# Patient Record
Sex: Male | Born: 1955 | Race: White | Hispanic: No | Marital: Single | Smoking: Never smoker
Health system: Southern US, Community
[De-identification: ages and names within clinical notes are randomized; demographics above are authoritative.]

## PROBLEM LIST (undated history)

## (undated) DIAGNOSIS — IMO0002 Reserved for concepts with insufficient information to code with codable children: Secondary | ICD-10-CM

## (undated) DIAGNOSIS — I10 Essential (primary) hypertension: Secondary | ICD-10-CM

## (undated) DIAGNOSIS — M329 Systemic lupus erythematosus, unspecified: Secondary | ICD-10-CM

## (undated) DIAGNOSIS — E559 Vitamin D deficiency, unspecified: Secondary | ICD-10-CM

## (undated) DIAGNOSIS — R7303 Prediabetes: Secondary | ICD-10-CM

## (undated) DIAGNOSIS — K219 Gastro-esophageal reflux disease without esophagitis: Secondary | ICD-10-CM

## (undated) DIAGNOSIS — J302 Other seasonal allergic rhinitis: Secondary | ICD-10-CM

## (undated) HISTORY — PX: TONSILLECTOMY: SUR1361

## (undated) HISTORY — DX: Vitamin D deficiency, unspecified: E55.9

## (undated) HISTORY — PX: WISDOM TOOTH EXTRACTION: SHX21

## (undated) HISTORY — DX: Prediabetes: R73.03

---

## 2002-03-14 ENCOUNTER — Ambulatory Visit (HOSPITAL_COMMUNITY): Admission: RE | Admit: 2002-03-14 | Discharge: 2002-03-14 | Payer: Self-pay | Admitting: Anesthesiology

## 2002-03-14 ENCOUNTER — Encounter: Payer: Self-pay | Admitting: Internal Medicine

## 2011-06-29 ENCOUNTER — Other Ambulatory Visit: Payer: Self-pay

## 2011-06-29 ENCOUNTER — Emergency Department (HOSPITAL_COMMUNITY): Payer: BC Managed Care – PPO

## 2011-06-29 ENCOUNTER — Encounter (HOSPITAL_BASED_OUTPATIENT_CLINIC_OR_DEPARTMENT_OTHER): Payer: Self-pay | Admitting: Emergency Medicine

## 2011-06-29 ENCOUNTER — Emergency Department (HOSPITAL_BASED_OUTPATIENT_CLINIC_OR_DEPARTMENT_OTHER)
Admission: EM | Admit: 2011-06-29 | Discharge: 2011-06-29 | Disposition: A | Discharge status: Home Health | Payer: BC Managed Care – PPO | Attending: Emergency Medicine | Admitting: Emergency Medicine

## 2011-06-29 ENCOUNTER — Emergency Department (INDEPENDENT_AMBULATORY_CARE_PROVIDER_SITE_OTHER): Payer: BC Managed Care – PPO

## 2011-06-29 DIAGNOSIS — E7800 Pure hypercholesterolemia, unspecified: Secondary | ICD-10-CM | POA: Insufficient documentation

## 2011-06-29 DIAGNOSIS — I10 Essential (primary) hypertension: Secondary | ICD-10-CM | POA: Insufficient documentation

## 2011-06-29 DIAGNOSIS — R911 Solitary pulmonary nodule: Secondary | ICD-10-CM

## 2011-06-29 DIAGNOSIS — R1031 Right lower quadrant pain: Secondary | ICD-10-CM

## 2011-06-29 DIAGNOSIS — K389 Disease of appendix, unspecified: Secondary | ICD-10-CM

## 2011-06-29 DIAGNOSIS — K7689 Other specified diseases of liver: Secondary | ICD-10-CM | POA: Insufficient documentation

## 2011-06-29 DIAGNOSIS — Z79899 Other long term (current) drug therapy: Secondary | ICD-10-CM | POA: Insufficient documentation

## 2011-06-29 DIAGNOSIS — K8020 Calculus of gallbladder without cholecystitis without obstruction: Secondary | ICD-10-CM | POA: Insufficient documentation

## 2011-06-29 DIAGNOSIS — R109 Unspecified abdominal pain: Secondary | ICD-10-CM

## 2011-06-29 DIAGNOSIS — F17200 Nicotine dependence, unspecified, uncomplicated: Secondary | ICD-10-CM | POA: Insufficient documentation

## 2011-06-29 DIAGNOSIS — R112 Nausea with vomiting, unspecified: Secondary | ICD-10-CM | POA: Insufficient documentation

## 2011-06-29 DIAGNOSIS — J309 Allergic rhinitis, unspecified: Secondary | ICD-10-CM | POA: Insufficient documentation

## 2011-06-29 DIAGNOSIS — M129 Arthropathy, unspecified: Secondary | ICD-10-CM | POA: Insufficient documentation

## 2011-06-29 HISTORY — DX: Essential (primary) hypertension: I10

## 2011-06-29 HISTORY — DX: Other seasonal allergic rhinitis: J30.2

## 2011-06-29 LAB — COMPREHENSIVE METABOLIC PANEL
ALT: 55 U/L — ABNORMAL HIGH (ref 0–53)
AST: 50 U/L — ABNORMAL HIGH (ref 0–37)
Albumin: 4.4 g/dL (ref 3.5–5.2)
Calcium: 9.6 mg/dL (ref 8.4–10.5)
Creatinine, Ser: 0.8 mg/dL (ref 0.50–1.35)
Sodium: 134 mEq/L — ABNORMAL LOW (ref 135–145)
Total Protein: 8 g/dL (ref 6.0–8.3)

## 2011-06-29 LAB — URINALYSIS, ROUTINE W REFLEX MICROSCOPIC
Bilirubin Urine: NEGATIVE
Glucose, UA: NEGATIVE mg/dL
Hgb urine dipstick: NEGATIVE
Ketones, ur: NEGATIVE mg/dL
Leukocytes, UA: NEGATIVE
Nitrite: NEGATIVE
Protein, ur: NEGATIVE mg/dL
Specific Gravity, Urine: 1.011 (ref 1.005–1.030)
Urobilinogen, UA: 0.2 mg/dL (ref 0.0–1.0)
pH: 6.5 (ref 5.0–8.0)

## 2011-06-29 LAB — DIFFERENTIAL
Basophils Absolute: 0 10*3/uL (ref 0.0–0.1)
Basophils Relative: 0 % (ref 0–1)
Eosinophils Relative: 0 % (ref 0–5)
Monocytes Absolute: 0.5 10*3/uL (ref 0.1–1.0)

## 2011-06-29 LAB — CBC
HCT: 41.6 % (ref 39.0–52.0)
MCHC: 35.6 g/dL (ref 30.0–36.0)
MCV: 86.1 fL (ref 78.0–100.0)
Platelets: 204 10*3/uL (ref 150–400)
RDW: 12 % (ref 11.5–15.5)
WBC: 8.6 10*3/uL (ref 4.0–10.5)

## 2011-06-29 MED ORDER — ONDANSETRON HCL 4 MG/2ML IJ SOLN
4.0000 mg | Freq: Once | INTRAMUSCULAR | Status: AC
  Administered 2011-06-29: 4 mg via INTRAVENOUS
  Filled 2011-06-29: qty 2

## 2011-06-29 MED ORDER — HYDROMORPHONE HCL PF 1 MG/ML IJ SOLN
INTRAMUSCULAR | Status: AC
  Administered 2011-06-29: 1 mg via INTRAVENOUS
  Filled 2011-06-29: qty 1

## 2011-06-29 MED ORDER — SODIUM CHLORIDE 0.9 % IV BOLUS (SEPSIS)
1000.0000 mL | Freq: Once | INTRAVENOUS | Status: AC
  Administered 2011-06-29: 1000 mL via INTRAVENOUS

## 2011-06-29 MED ORDER — HYDROMORPHONE HCL PF 1 MG/ML IJ SOLN
1.0000 mg | Freq: Once | INTRAMUSCULAR | Status: AC
  Administered 2011-06-29: 1 mg via INTRAVENOUS
  Filled 2011-06-29: qty 1

## 2011-06-29 MED ORDER — IOHEXOL 300 MG/ML  SOLN
100.0000 mL | Freq: Once | INTRAMUSCULAR | Status: AC | PRN
  Administered 2011-06-29: 100 mL via INTRAVENOUS

## 2011-06-29 MED ORDER — HYDROMORPHONE HCL PF 1 MG/ML IJ SOLN
1.0000 mg | Freq: Once | INTRAMUSCULAR | Status: AC
  Administered 2011-06-29: 1 mg via INTRAVENOUS

## 2011-06-29 NOTE — Consult Note (Addendum)
He has gallstones by CT scan but by history I am not convinced this is source of his pain.  There is no other source noted though.  He is not tender at all in his abdomen and has absolutely no tenderness in his ruq.  I think reasonable to pursue an ultrasound as the next step and go from there.    COMPLETE ABDOMINAL ULTRASOUND  Comparison: CT abdomen pelvis from earlier the same day.  Findings:  Gallbladder: Shadowing cholelithiasis. Numerous stones both in  the neck and fundus. Individually stones probably measure up to 11  mm in diameter. No gallbladder wall thickening. No sonographic  Murphy's sign elicited. No pericholecystic fluid.  Common bile duct: Normal measuring 4 mm in diameter.  Liver: Diffuse increased echogenicity. No focal liver lesion. No  intrahepatic biliary dilatation.  IVC: Appears normal.  Pancreas: Incompletely visualized due to overlying bowel gas,  visualized portions within normal limits.  Spleen: Normal measuring 8.6 cm in length.  Right Kidney: Normal measuring 11.4 cm in length.  Left Kidney: Normal measuring 12.2 cm in length.  Abdominal aorta: No aneurysm identified.  IMPRESSION:  1. Cholelithiasis. No sonographic evidence of acute  cholecystitis.  2. Hepatic steatosis.   Ultrasound and exam certainly show cholelithiasis. He has no evidence cholecystitis though by exam, u/s or labs.  His transminases are very mildly elevated.  I am not even sure his symptoms are referable to his gallbladder.  He is pain free now and I think reasonable to send him home.  If symptoms recur I gave him my card and asked him to call me.

## 2011-06-29 NOTE — Consult Note (Signed)
[address] [place] D[place]SW[place]TH November 05, 1955  [id number].   Requesting MD: Dr. Duwayne Heck ray Chief Complaint/Reason for Consult: gallstones, abdominal pain HPI: This is a 56 yo white male who began having severe periumbilical abdominal pain last night around 8:00pm.  He developed some nausea and vomiting after drinking a cup of coffee to help him have a BM.  He did have a BM, but this did not help his pain.  His nausea and vomiting dissipated and has not returned.  He had a mild pain about a week ago, but otherwise has never had anything similar to what he is currently experiencing.  He went to [facility] and had a CT scan to rule out appendicitis.  This was negative but did show gallstones.  The case was discussed with our surgical team, and we agreed to have him transferred to [facility] for further evaluation.  The patient currently states his pain is better secondary to some Dilaudid.  He denies any chest pain or fevers.  Review of Systems: Please see HPI, otherwise all other systems reviewed and are negative, except for a sinus headache this morning with some post nasal drip.  History reviewed. No pertinent family history.  Past Medical History  Diagnosis Date  . Hypertension   . Hypercholesteremia   . Arthritis   . Seasonal allergies     History reviewed. No pertinent past surgical history.  Social History:  reports that he has been smoking Cigars.  He has never used smokeless tobacco. He reports that he drinks alcohol. His drug history not on file.  Allergies: No Known Allergies  Medications: ATENOLOL 100 MG PO TABS  Oral  Take 100 mg by mouth daily.  Marland Kitchen  EZETIMIBE 10 MG PO TABS  Oral  Take 10 mg by mouth daily.  .  FENOFIBRATE 40 MG PO TABS  Oral  Take by mouth.  Marland Kitchen  VITAMIN D (ERGOCALCIFEROL) 50000 UNITS PO CAPS  Oral  Take 50,000 Units by mouth.   Blood pressure 152/94, pulse 84, temperature 97.4 F (36.3 C), temperature source Oral, resp. rate 20, height 6' (1.829 m), weight 260 lb  (117.935 kg), SpO2 97.00%. Physical Exam: General: Pleasant rather obese white male who is currently laying in bed in NAD HEENT: Head is normocephalic, atraumatic.  Sclera are noninjected. PERRL.  Glasses are present.  Mouth is pink. Heart: regular, rate, and rhythm.  No murmurs, gallops, or rubs.  Palpable radial and pedal pulses bilaterally Lungs: CTAB, no wheezes, rhonchi, or rales.  Respiratory effort is nonlabored Abd: soft, nontender, ND, +BS, no masses, hernias, or organomegaly. MS: all 4 extremities are symmetrical with no cyanosis, clubbing, or edema Skin: warm and dry with no obvious rashes, lesions, or rashes Psych: A&O x3 with an appropriate affect   Results for orders placed during the hospital encounter of 06/29/11 (from the past 48 hour(s))  URINALYSIS, ROUTINE W REFLEX MICROSCOPIC     Status: Normal   Collection Time   06/29/11  8:06 AM      Component Value Range Comment   Color, Urine YELLOW  YELLOW     APPearance CLEAR  CLEAR     Specific Gravity, Urine 1.011  1.005 - 1.030     pH 6.5  5.0 - 8.0     Glucose, UA NEGATIVE  NEGATIVE (mg/dL)    Hgb urine dipstick NEGATIVE  NEGATIVE     Bilirubin Urine NEGATIVE  NEGATIVE     Ketones, ur NEGATIVE  NEGATIVE (mg/dL)    Protein, ur  NEGATIVE  NEGATIVE (mg/dL)    Urobilinogen, UA 0.2  0.0 - 1.0 (mg/dL)    Nitrite NEGATIVE  NEGATIVE     Leukocytes, UA NEGATIVE  NEGATIVE  MICROSCOPIC NOT DONE ON URINES WITH NEGATIVE PROTEIN, BLOOD, LEUKOCYTES, NITRITE, OR GLUCOSE <1000 mg/dL.  CBC     Status: Normal   Collection Time   06/29/11  8:35 AM      Component Value Range Comment   WBC 8.6  4.0 - 10.5 (K/uL)    RBC 4.83  4.22 - 5.81 (MIL/uL)    Hemoglobin 14.8  13.0 - 17.0 (g/dL)    HCT 16.1  09.6 - 04.5 (%)    MCV 86.1  78.0 - 100.0 (fL)    MCH 30.6  26.0 - 34.0 (pg)    MCHC 35.6  30.0 - 36.0 (g/dL)    RDW 40.9  81.1 - 91.4 (%)    Platelets 204  150 - 400 (K/uL)   DIFFERENTIAL     Status: Abnormal   Collection Time   06/29/11   8:35 AM      Component Value Range Comment   Neutrophils Relative 78 (*) 43 - 77 (%)    Neutro Abs 6.7  1.7 - 7.7 (K/uL)    Lymphocytes Relative 16  12 - 46 (%)    Lymphs Abs 1.4  0.7 - 4.0 (K/uL)    Monocytes Relative 6  3 - 12 (%)    Monocytes Absolute 0.5  0.1 - 1.0 (K/uL)    Eosinophils Relative 0  0 - 5 (%)    Eosinophils Absolute 0.0  0.0 - 0.7 (K/uL)    Basophils Relative 0  0 - 1 (%)    Basophils Absolute 0.0  0.0 - 0.1 (K/uL)   COMPREHENSIVE METABOLIC PANEL     Status: Abnormal   Collection Time   06/29/11  8:35 AM      Component Value Range Comment   Sodium 134 (*) 135 - 145 (mEq/L)    Potassium 4.1  3.5 - 5.1 (mEq/L)    Chloride 98  96 - 112 (mEq/L)    CO2 26  19 - 32 (mEq/L)    Glucose, Bld 134 (*) 70 - 99 (mg/dL)    BUN 11  6 - 23 (mg/dL)    Creatinine, Ser 7.82  0.50 - 1.35 (mg/dL)    Calcium 9.6  8.4 - 10.5 (mg/dL)    Total Protein 8.0  6.0 - 8.3 (g/dL)    Albumin 4.4  3.5 - 5.2 (g/dL)    AST 50 (*) 0 - 37 (U/L)    ALT 55 (*) 0 - 53 (U/L)    Alkaline Phosphatase 56  39 - 117 (U/L)    Total Bilirubin 0.5  0.3 - 1.2 (mg/dL)    GFR calc non Af Amer >90  >90 (mL/min)    GFR calc Af Amer >90  >90 (mL/min)   LIPASE, BLOOD     Status: Normal   Collection Time   06/29/11  8:35 AM      Component Value Range Comment   Lipase 40  11 - 59 (U/L)    Ct Abdomen Pelvis W Contrast  06/29/2011  *RADIOLOGY REPORT*  Clinical Data: Right lower quadrant and right upper quadrant abdominal pain since last night.  Nausea, vomiting and diarrhea.  CT ABDOMEN AND PELVIS WITH CONTRAST  Technique:  Multidetector CT imaging of the abdomen and pelvis was performed following the standard protocol during bolus administration of intravenous contrast.  Contrast: OMNIPAQUE IOHEXOL 300 MG/ML IV SOLN  Comparison: No priors.  Findings:  Lung Bases: 5 mm nodule in the periphery of the lateral segment of the right middle lobe (image one of series four).  Otherwise, unremarkable.  Abdomen/Pelvis:   Numerous calcified gallstones noted in the dependent portion of the gallbladder.  Gallbladder appears moderately distended, however, there is no definite wall thickening, pericholecystic fluid or stranding at this time to suggest presence of acute cholecystitis.  The enhanced appearance of the liver, pancreas, spleen, bilateral adrenal glands and bilateral kidneys is unremarkable.  No ascites or pneumoperitoneum and no pathologic distension of bowel.  No pathologic lymphadenopathy.  A small amount of high-density material is noted in the proximal aspect of the appendix, however, the appendix measures only 6 mm in diameter (normal), and there is no abnormal periappendiceal fluid or stranding to suggest the presence of acute appendicitis at this time.  Urinary bladder is unremarkable in appearance.  Musculoskeletal: There are no aggressive appearing lytic or blastic lesions noted in the visualized portions of the skeleton.  IMPRESSION: 1.  Cholelithiasis without imaging findings to suggest the presence of acute cholecystitis at this time. 2.  While there is an appendicolith within the neck of the appendix, there is no surrounding periappendiceal fluid or stranding at this time to suggest the presence of acute appendicitis. 3.  5 mm nodule in the lateral segment of the right middle lobe. If the patient is at high risk for bronchogenic carcinoma, follow-up chest CT at 6-12 months is recommended.  If the patient is at low risk for bronchogenic carcinoma, follow-up chest CT at 12 months is recommended.  This recommendation follows the consensus statement: Guidelines for Management of Small Pulmonary Nodules Detected on CT Scans: A Statement from the [employer] as published in Radiology 2005; 237:395-400. Online at: DietDisorder.cz.  Original Report Authenticated By: Florencia Reasons, M.D.       Assessment/Plan 1. Abdominal pain 2. Gallstones  Plan: 1. The  patient's pain is not classic for gallbladder disease or gallstones.  His CT scan shows no evidence of appendicitis or any other findings.  It is unclear right now whether is pain is related to his gallstones or not.  We will go ahead and obtain an abdominal ultrasound to further look at the gallbladder.  If this is negative, then he may require a HIDA scan.  We will follow for now and make further recommendations after further testing.  Tanae Petrosky E 06/29/2011, 12:52 PM

## 2011-06-29 NOTE — Discharge Instructions (Signed)
Abdominal Pain Many things can cause belly (abdominal) pain. Most times, the belly pain is not dangerous. The amount of belly pain does not tell how serious the problem may be. Many cases of belly pain can be watched and treated at home. HOME CARE   Do not take medicines that help you go poop (laxatives) unless told to by your doctor.   Only take medicine as told by your doctor.   Eat or drink as told by your doctor. Your doctor will tell you if you should be on a special diet.  GET HELP RIGHT AWAY IF:   The pain does not go away.   You have a fever.   You keep throwing up (vomiting).   The pain changes and is only in the right or left part of the belly.   You have bloody or tarry looking poop.  MAKE SURE YOU:   Understand these instructions.   Will watch your condition.   Will get help right away if you are not doing well or get worse.  Document Released: 10/12/2007 Document Revised: 01/05/2011 Document Reviewed: 05/11/2009 [facility] Patient Information 2012 [employer].     Stay on clear liquids for the next 24 hours; if your pain worsens call Dr. Donne Hazel or return to the emergency department. Your blood pressure should be rechecked within the next 3 weeks

## 2011-06-29 NOTE — ED Provider Notes (Signed)
Seen briefly by me; complains of diffuse abdominal pain since 8:00 PM yesterday with one episode of diarrhea. Pain mild as I examine him on exam alert nontoxic abdomen obese normal active bowel sounds mild diffuse tenderness. Dr.Wakefield evaluate patient in the emergency department and made arrangements for outpatient followup. . Plan clear liquid diet for 24 hours. No prescriptions written he is instructed to call Dr. Donne Hazel if pain worsens He should get his blood pressure rechecked within the next 3 weeks   Sean Dakin, MD 06/29/11 [phone]

## 2011-06-29 NOTE — ED Notes (Signed)
WYB:[id number]<XL> Expected date:06/29/11<BR> Expected time:11:27 AM<BR> Means of arrival:Ambulance<BR> Comments:<BR> Hold for [facility] transfer

## 2011-06-29 NOTE — ED Notes (Signed)
Lower abdominal pain and epigastric area since last night.  Some nausea, vomited x 5 times last night. Diarrhea x 1 yesterday.  Some diff with breathing due to sinus passages assoc with headache.

## 2011-06-29 NOTE — ED Provider Notes (Addendum)
History     CSN: [id number]  Arrival date & time 06/29/11  8016   First MD Initiated Contact with Patient 06/29/11 [phone]      Chief Complaint  Patient presents with  . Abdominal Pain  . Nausea    (Consider location/radiation/quality/duration/timing/severity/associated sxs/prior treatment) HPI  Patient complaining of abdominal pain began last night radiated up to chest with nausea and vomited multiple times.  Pain started at 8 p.m.  Pain continued through night.  When  Pain started it escalated over about 40 minutes.  Patient had bowel movement and felt somewhat better.  Pain continued through night.  Pain 8-9 all night better with sitting up worse with lying down. Moving around improves it.  Has not tried to eat. Unable to keep fluid down, drank water ok, but tried coffee and vomited about mn, some water and soda since mn without vomiting.  No similar symptoms in past.  Occasional alcohol use but denies regular use.  No fever, some chills.  Some sinus problems with drainage and denies pain on inspiration.  PMD is Dr. Melford Aase.    Past Medical History  Diagnosis Date  . Hypertension   . Hypercholesteremia   . Arthritis   . Seasonal allergies     History reviewed. No pertinent past surgical history.  History reviewed. No pertinent family history.  History  Substance Use Topics  . Smoking status: Current Some Day Smoker    Types: Cigars  . Smokeless tobacco: Never Used  . Alcohol Use: Yes     occasional      Review of Systems  All other systems reviewed and are negative.    Allergies  Review of patient's allergies indicates no known allergies.  Home Medications   Current Outpatient Rx  Name Route Sig Dispense Refill  . ATENOLOL 100 MG PO TABS Oral Take 100 mg by mouth daily.    Marland Kitchen EZETIMIBE 10 MG PO TABS Oral Take 10 mg by mouth daily.    . FENOFIBRATE 40 MG PO TABS Oral Take by mouth.    Marland Kitchen VITAMIN D (ERGOCALCIFEROL) 50000 UNITS PO CAPS Oral Take 50,000 Units by  mouth.      BP 162/99  Pulse 83  Temp(Src) 97.7 F (36.5 C) (Oral)  Resp 22  Ht 6' (1.829 m)  Wt 260 lb (117.935 kg)  BMI 35.26 kg/m2  SpO2 99%  Physical Exam  Vitals reviewed. Constitutional: He is oriented to person, place, and time. He appears well-developed and well-nourished.       obese  HENT:  Head: Normocephalic and atraumatic.  Right Ear: External ear normal.  Left Ear: External ear normal.  Nose: Nose normal.  Mouth/Throat: Oropharynx is clear and moist.  Eyes: Conjunctivae and EOM are normal. Pupils are equal, round, and reactive to light.  Neck: Normal range of motion. Neck supple.  Cardiovascular: Normal rate, regular rhythm, normal heart sounds and intact distal pulses.   Pulmonary/Chest: Effort normal and breath sounds normal.  Abdominal: Soft. There is tenderness.       rlq tenderness  Genitourinary: Penis normal.  Musculoskeletal: Normal range of motion.  Neurological: He is alert and oriented to person, place, and time. He has normal reflexes.  Skin: Skin is warm and dry.  Psychiatric: He has a normal mood and affect.    ED Course  Procedures (including critical care time)   Labs Reviewed  URINALYSIS, ROUTINE W REFLEX MICROSCOPIC   No results found. Results for orders placed during the hospital encounter of  06/29/11  URINALYSIS, ROUTINE W REFLEX MICROSCOPIC      Component Value Range   Color, Urine YELLOW  YELLOW    APPearance CLEAR  CLEAR    Specific Gravity, Urine 1.011  1.005 - 1.030    pH 6.5  5.0 - 8.0    Glucose, UA NEGATIVE  NEGATIVE (mg/dL)   Hgb urine dipstick NEGATIVE  NEGATIVE    Bilirubin Urine NEGATIVE  NEGATIVE    Ketones, ur NEGATIVE  NEGATIVE (mg/dL)   Protein, ur NEGATIVE  NEGATIVE (mg/dL)   Urobilinogen, UA 0.2  0.0 - 1.0 (mg/dL)   Nitrite NEGATIVE  NEGATIVE    Leukocytes, UA NEGATIVE  NEGATIVE   CBC      Component Value Range   WBC 8.6  4.0 - 10.5 (K/uL)   RBC 4.83  4.22 - 5.81 (MIL/uL)   Hemoglobin 14.8  13.0 - 17.0  (g/dL)   HCT 41.6  39.0 - 52.0 (%)   MCV 86.1  78.0 - 100.0 (fL)   MCH 30.6  26.0 - 34.0 (pg)   MCHC 35.6  30.0 - 36.0 (g/dL)   RDW 12.0  11.5 - 15.5 (%)   Platelets 204  150 - 400 (K/uL)  DIFFERENTIAL      Component Value Range   Neutrophils Relative 78 (*) 43 - 77 (%)   Neutro Abs 6.7  1.7 - 7.7 (K/uL)   Lymphocytes Relative 16  12 - 46 (%)   Lymphs Abs 1.4  0.7 - 4.0 (K/uL)   Monocytes Relative 6  3 - 12 (%)   Monocytes Absolute 0.5  0.1 - 1.0 (K/uL)   Eosinophils Relative 0  0 - 5 (%)   Eosinophils Absolute 0.0  0.0 - 0.7 (K/uL)   Basophils Relative 0  0 - 1 (%)   Basophils Absolute 0.0  0.0 - 0.1 (K/uL)  COMPREHENSIVE METABOLIC PANEL      Component Value Range   Sodium 134 (*) 135 - 145 (mEq/L)   Potassium 4.1  3.5 - 5.1 (mEq/L)   Chloride 98  96 - 112 (mEq/L)   CO2 26  19 - 32 (mEq/L)   Glucose, Bld 134 (*) 70 - 99 (mg/dL)   BUN 11  6 - 23 (mg/dL)   Creatinine, Ser 0.80  0.50 - 1.35 (mg/dL)   Calcium 9.6  8.4 - 10.5 (mg/dL)   Total Protein 8.0  6.0 - 8.3 (g/dL)   Albumin 4.4  3.5 - 5.2 (g/dL)   AST 50 (*) 0 - 37 (U/L)   ALT 55 (*) 0 - 53 (U/L)   Alkaline Phosphatase 56  39 - 117 (U/L)   Total Bilirubin 0.5  0.3 - 1.2 (mg/dL)   GFR calc non Af Amer >90  >90 (mL/min)   GFR calc Af Amer >90  >90 (mL/min)  LIPASE, BLOOD      Component Value Range   Lipase 40  11 - 59 (U/L)    No diagnosis found.  Ct Abdomen Pelvis W Contrast  06/29/2011  *RADIOLOGY REPORT*  Clinical Data: Right lower quadrant and right upper quadrant abdominal pain since last night.  Nausea, vomiting and diarrhea.  CT ABDOMEN AND PELVIS WITH CONTRAST  Technique:  Multidetector CT imaging of the abdomen and pelvis was performed following the standard protocol during bolus administration of intravenous contrast.  Contrast: 14m OMNIPAQUE IOHEXOL 300 MG/ML IV SOLN  Comparison: No priors.  Findings:  Lung Bases: 5 mm nodule in the periphery of the lateral segment of the right  middle lobe (image one of  series four).  Otherwise, unremarkable.  Abdomen/Pelvis:  Numerous calcified gallstones noted in the dependent portion of the gallbladder.  Gallbladder appears moderately distended, however, there is no definite wall thickening, pericholecystic fluid or stranding at this time to suggest presence of acute cholecystitis.  The enhanced appearance of the liver, pancreas, spleen, bilateral adrenal glands and bilateral kidneys is unremarkable.  No ascites or pneumoperitoneum and no pathologic distension of bowel.  No pathologic lymphadenopathy.  A small amount of high-density material is noted in the proximal aspect of the appendix, however, the appendix measures only 6 mm in diameter (normal), and there is no abnormal periappendiceal fluid or stranding to suggest the presence of acute appendicitis at this time.  Urinary bladder is unremarkable in appearance.  Musculoskeletal: There are no aggressive appearing lytic or blastic lesions noted in the visualized portions of the skeleton.  IMPRESSION: 1.  Cholelithiasis without imaging findings to suggest the presence of acute cholecystitis at this time. 2.  While there is an appendicolith within the neck of the appendix, there is no surrounding periappendiceal fluid or stranding at this time to suggest the presence of acute appendicitis. 3.  5 mm nodule in the lateral segment of the right middle lobe. If the patient is at high risk for bronchogenic carcinoma, follow-up chest CT at 6-12 months is recommended.  If the patient is at low risk for bronchogenic carcinoma, follow-up chest CT at 12 months is recommended.  This recommendation follows the consensus statement: Guidelines for Management of Small Pulmonary Nodules Detected on CT Scans: A Statement from the [employer] as published in Radiology 2005; 237:395-400. Online at: https://www.arnold.com/.  Original Report Authenticated By: Etheleen Mayhew, M.D.    Date: 06/29/2011   Rate: 68  Rhythm: normal sinus rhythm  QRS Axis: normal  Intervals: normal  ST/T Wave abnormalities: normal  Conduction Disutrbances:incomplete rbbb  Narrative Interpretation:   Old EKG Reviewed: none available   MDM  I discussed the patient's results with him. I have consultation with general surgery. I spoke with Saverio Danker. He is to be transferred to [facility] long emergency department for surgery evaluation. Ms. Maxwell Caul is working with Dr. Donne Hazel. The patient has had recurrent pain although it was initially controlled with Dilaudid 1 mg. He has remained n.p.o. here except for by mouth contrast. He is also received Zofran for nausea. He is offered further pain medicine at this time but has currently refused although he has stated that his pain is returning.        Shaune Pollack, MD 06/29/11 [place], MD 06/29/11 [id number]  Shaune Pollack, MD 07/04/11 [phone]

## 2011-07-14 ENCOUNTER — Encounter: Payer: Self-pay | Admitting: Gastroenterology

## 2011-07-14 ENCOUNTER — Other Ambulatory Visit (HOSPITAL_COMMUNITY): Payer: Self-pay | Admitting: Internal Medicine

## 2011-07-14 DIAGNOSIS — K8020 Calculus of gallbladder without cholecystitis without obstruction: Secondary | ICD-10-CM

## 2011-07-14 DIAGNOSIS — R1084 Generalized abdominal pain: Secondary | ICD-10-CM

## 2011-07-21 ENCOUNTER — Encounter (HOSPITAL_COMMUNITY)
Admission: RE | Admit: 2011-07-21 | Discharge: 2011-07-21 | Disposition: A | Discharge status: Home / Self Care | Payer: BC Managed Care – PPO | Source: Ambulatory Visit | Attending: Internal Medicine | Admitting: Internal Medicine

## 2011-07-21 ENCOUNTER — Encounter: Payer: Self-pay | Admitting: *Deleted

## 2011-07-21 DIAGNOSIS — R112 Nausea with vomiting, unspecified: Secondary | ICD-10-CM | POA: Insufficient documentation

## 2011-07-21 DIAGNOSIS — R1084 Generalized abdominal pain: Secondary | ICD-10-CM

## 2011-07-21 DIAGNOSIS — R109 Unspecified abdominal pain: Secondary | ICD-10-CM | POA: Insufficient documentation

## 2011-07-21 DIAGNOSIS — K8020 Calculus of gallbladder without cholecystitis without obstruction: Secondary | ICD-10-CM

## 2011-07-21 MED ORDER — TECHNETIUM TC 99M MEBROFENIN IV KIT
5.0000 | PACK | Freq: Once | INTRAVENOUS | Status: AC | PRN
  Administered 2011-07-21: 5 via INTRAVENOUS

## 2011-07-21 MED ORDER — SINCALIDE 5 MCG IJ SOLR
INTRAMUSCULAR | Status: AC
  Administered 2011-07-21: 2.32 ug
  Filled 2011-07-21: qty 10

## 2011-07-28 ENCOUNTER — Encounter: Payer: Self-pay | Admitting: Gastroenterology

## 2011-07-28 ENCOUNTER — Ambulatory Visit (INDEPENDENT_AMBULATORY_CARE_PROVIDER_SITE_OTHER): Payer: BC Managed Care – PPO | Admitting: Gastroenterology

## 2011-07-28 VITALS — BP 134/86 | HR 68 | Ht 72.0 in | Wt 258.0 lb

## 2011-07-28 DIAGNOSIS — K7581 Nonalcoholic steatohepatitis (NASH): Secondary | ICD-10-CM

## 2011-07-28 DIAGNOSIS — R109 Unspecified abdominal pain: Secondary | ICD-10-CM

## 2011-07-28 DIAGNOSIS — K8020 Calculus of gallbladder without cholecystitis without obstruction: Secondary | ICD-10-CM

## 2011-07-28 DIAGNOSIS — K7689 Other specified diseases of liver: Secondary | ICD-10-CM

## 2011-07-28 DIAGNOSIS — K219 Gastro-esophageal reflux disease without esophagitis: Secondary | ICD-10-CM

## 2011-07-28 MED ORDER — OMEPRAZOLE-SODIUM BICARBONATE 40-1100 MG PO CAPS: 1.0000 | ORAL_CAPSULE | Freq: Every day | ORAL | Status: DC

## 2011-07-28 MED ORDER — HYOSCYAMINE SULFATE 0.125 MG SL SUBL: 0.1250 mg | SUBLINGUAL_TABLET | Freq: Two times a day (BID) | SUBLINGUAL | Status: AC | PRN

## 2011-07-28 MED ORDER — PEG-KCL-NACL-NASULF-NA ASC-C 100 G PO SOLR: 1.0000 | Freq: Once | ORAL | Status: DC

## 2011-07-28 NOTE — Patient Instructions (Addendum)
You have been given a separate informational sheet regarding your tobacco use, the importance of quitting and local resources to help you quit. You have been scheduled for an endoscopy and colonoscopy with propofol. Please follow the written instructions given to you at your visit today. Please pick up your prep at the pharmacy within the next 1-3 days. We have sent the following medications to your pharmacy for you to pick up at your convenience: Levsin SL We have given you samples of Zegerid to take once daily 30 minutes before breakfast until your endoscopy. Further instructions will be given regarding your medications after the endoscopy. CC: Dr [name]

## 2011-07-28 NOTE — Progress Notes (Signed)
History of Present Illness:  This is a 56 year old Caucasian male referred by Dr. Melford Aase for evaluation of several gastrointestinal problems. He was briefly hospitalized in mid February with nausea and vomiting, abdominal pain, mildly abnormal liver function tests. Ultrasonography and CT scan of the abdomen confirmed gallstones but no evidence of acute cholecystitis or definite appendicitis. HIDA scan showed no evidence of cystic duct obstruction. Since that time his complaint of a constant dull left upper quadrant discomfort he is briefly by eating small meals. He did have an episode of melena several weeks ago, but currently his stools are not bloody or melanotic. He does have some loose stools 3-4 times a day as a change from his regular daily pattern. His left upper quadrant pain is also briefly relieved by having a bowel movement. He complains of severe postnasal drip from chronic sinusitis, otherwise is in good health. View of his labs showed a persistent elevation in his transaminases less than twice normal system with fatty infiltration of his liver seen on ultrasound exam. Ulcer review of CT scan shows a 5 mm right middle lobe lung nodule which require followup CT in 6 months. The patient denies any cardiopulmonary symptoms at this time.  He also denies a specific food intolerances, or current nausea and vomiting, dyspepsia or reflux symptoms. He denies abuse of alcohol, cigarettes, or NSAIDs.  I have reviewed this patient's present history, medical and surgical past history, allergies and medications.     ROS: The remainder of the 10 point ROS is negative... white postnasal drip, chronic low back pain, nonproductive cough, he also complains of chronic fatigue. He does use when necessary Aleve for joint pains, and serves as a Writer.     Physical Exam: Blood pressure 134/86, pulse 68 and regular, and BMI 34.99. General well developed well nourished patient in no acute distress,  appearing his stated age Eyes PERRLA, no icterus, fundoscopic exam per opthamologist Skin no lesions noted Neck supple, no adenopathy, no thyroid enlargement, no tenderness Chest clear to percussion and auscultation Heart no significant murmurs, gallops or rubs noted Abdomen no hepatosplenomegaly masses or tenderness, BS normal.  Extremities no acute joint lesions, edema, phlebitis or evidence of cellulitis. Neurologic patient oriented x 3, cranial nerves intact, no focal neurologic deficits noted. Psychological mental status normal and normal affect.  Assessment and plan: Patient has numerous calcified gallstones, apparently had an acute episode of biliary colic in February, and will need laparoscopic cholecystectomy. 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 His left upper quadrant pain is somewhat atypical, but with his history of melena and NSAID use, we need to exclude a penetrating peptic ulcer. Review of his labs shows no evidence of pancreatitis. He does need colonoscopy exam because of the location of his pain and his age. I have empirically placed him on Zegerid 40 mg a day pending further evaluation. I have asked him to avoid NSAIDs and to use when necessary sublingual Levsin for his left upper quadrant pain.  Please copy Dr.McKeown and Dr. Donne Hazel at [facility] surgery.  Encounter Diagnoses  Name Primary?  . GERD (gastroesophageal reflux disease) Yes  . Abdominal  pain, other specified site

## 2011-08-01 ENCOUNTER — Encounter: Payer: Self-pay | Admitting: Gastroenterology

## 2011-08-01 ENCOUNTER — Ambulatory Visit (AMBULATORY_SURGERY_CENTER): Payer: BC Managed Care – PPO | Admitting: Gastroenterology

## 2011-08-01 VITALS — BP 160/98 | HR 65 | Temp 98.2°F | Resp 17 | Ht 72.0 in | Wt 258.0 lb

## 2011-08-01 DIAGNOSIS — K8020 Calculus of gallbladder without cholecystitis without obstruction: Secondary | ICD-10-CM

## 2011-08-01 DIAGNOSIS — K921 Melena: Secondary | ICD-10-CM

## 2011-08-01 DIAGNOSIS — R109 Unspecified abdominal pain: Secondary | ICD-10-CM

## 2011-08-01 DIAGNOSIS — Z1211 Encounter for screening for malignant neoplasm of colon: Secondary | ICD-10-CM

## 2011-08-01 DIAGNOSIS — K219 Gastro-esophageal reflux disease without esophagitis: Secondary | ICD-10-CM

## 2011-08-01 DIAGNOSIS — K2901 Acute gastritis with bleeding: Secondary | ICD-10-CM

## 2011-08-01 DIAGNOSIS — R112 Nausea with vomiting, unspecified: Secondary | ICD-10-CM

## 2011-08-01 DIAGNOSIS — D131 Benign neoplasm of stomach: Secondary | ICD-10-CM

## 2011-08-01 HISTORY — DX: Acute gastritis with bleeding: K29.01

## 2011-08-01 MED ORDER — OMEPRAZOLE-SODIUM BICARBONATE 40-1100 MG PO CAPS: 1.0000 | ORAL_CAPSULE | Freq: Every day | ORAL | Status: DC

## 2011-08-01 MED ORDER — SODIUM CHLORIDE 0.9 % IV SOLN: 500.0000 mL | INTRAVENOUS | Status: DC

## 2011-08-01 NOTE — Op Note (Signed)
[facility] [address]. [place], [place]  [address]  COLONOSCOPY PROCEDURE REPORT  PATIENT:  Sean, Maldonado  MR#:  [id number] BIRTHDATE:  10/07/1955, 55 yrs. old  GENDER:  male ENDOSCOPIST:  Loralee Pacas. Sharlett Iles, MD, [facility] REF. BY:  [name], M.D. PROCEDURE DATE:  08/01/2011 PROCEDURE:  Average-risk screening colonoscopy G0121 ASA CLASS:  Class II INDICATIONS:  Routine Risk Screening LUQ PAIN. MEDICATIONS:   propofol (Diprivan) 150 mg IV  DESCRIPTION OF PROCEDURE:   After the risks and benefits and of the procedure were explained, informed consent was obtained. Digital rectal exam was performed and revealed no abnormalities. The [id number] [id number] endoscope was introduced through the anus and advanced to the cecum, which was identified by both the appendix and ileocecal valve.  The quality of the prep was excellent, using MoviPrep.  The instrument was then slowly withdrawn as the colon was fully examined. <<PROCEDUREIMAGES>>  FINDINGS:  No polyps or cancers were seen.  This was otherwise a normal examination of the colon.   Retroflexed views in the rectum revealed no abnormalities.    The scope was then withdrawn from the patient and the procedure completed.  COMPLICATIONS:  None ENDOSCOPIC IMPRESSION: 1) No polyps or cancers 2) Otherwise normal examination RECOMMENDATIONS: 1) OP follow-up is advised on a PRN basis. 2) Continue current colorectal screening recommendations for "routine risk" patients with a repeat colonoscopy in 10 years.  REPEAT EXAM:  No  ______________________________ Loralee Pacas. Sharlett Iles, MD, [facility]  CC:  n. eSIGNED:   Loralee Pacas. Kaven Cumbie at 08/01/2011 09:56 AM  [place], [place], [phone]

## 2011-08-01 NOTE — Progress Notes (Signed)
Patient did not experience any of the following events: a burn prior to discharge; a fall within the facility; wrong site/side/patient/procedure/implant event; or a hospital transfer or hospital admission upon discharge from the facility. (G8907) Patient did not have preoperative order for IV antibiotic SSI prophylaxis. (G8918)  

## 2011-08-01 NOTE — Patient Instructions (Signed)
Discharge instructions given with verbal understanding. Handout on gastritis given. Resume previous medications.YOU HAD AN ENDOSCOPIC PROCEDURE TODAY AT [facility]: Refer to the procedure report that was given to you for any specific questions about what was found during the examination.  If the procedure report does not answer your questions, please call your gastroenterologist to clarify.  If you requested that your care partner not be given the details of your procedure findings, then the procedure report has been included in a sealed envelope for you to review at your convenience later.  YOU SHOULD EXPECT: Some feelings of bloating in the abdomen. Passage of more gas than usual.  Walking can help get rid of the air that was put into your GI tract during the procedure and reduce the bloating. If you had a lower endoscopy (such as a colonoscopy or flexible sigmoidoscopy) you may notice spotting of blood in your stool or on the toilet paper. If you underwent a bowel prep for your procedure, then you may not have a normal bowel movement for a few days.  DIET: Your first meal following the procedure should be a light meal and then it is ok to progress to your normal diet.  A half-sandwich or bowl of soup is an example of a good first meal.  Heavy or fried foods are harder to digest and may make you feel nauseous or bloated.  Likewise meals heavy in dairy and vegetables can cause extra gas to form and this can also increase the bloating.  Drink plenty of fluids but you should avoid alcoholic beverages for 24 hours.  ACTIVITY: Your care partner should take you home directly after the procedure.  You should plan to take it easy, moving slowly for the rest of the day.  You can resume normal activity the day after the procedure however you should NOT DRIVE or use heavy machinery for 24 hours (because of the sedation medicines used during the test).    SYMPTOMS TO REPORT IMMEDIATELY: A  gastroenterologist can be reached at any hour.  During normal business hours, 8:30 AM to 5:00 PM Monday through Friday, call [phone].  After hours and on weekends, please call the GI answering service at [phone] who will take a message and have the physician on call contact you.   Following lower endoscopy (colonoscopy or flexible sigmoidoscopy):  Excessive amounts of blood in the stool  Significant tenderness or worsening of abdominal pains  Swelling of the abdomen that is new, acute  Fever of 100F or higher FOLLOW UP: If any biopsies were taken you will be contacted by phone or by letter within the next 1-3 weeks.  Call your gastroenterologist if you have not heard about the biopsies in 3 weeks.  Our staff will call the home number listed on your records the next business day following your procedure to check on you and address any questions or concerns that you may have at that time regarding the information given to you following your procedure. This is a courtesy call and so if there is no answer at the home number and we have not heard from you through the emergency physician on call, we will assume that you have returned to your regular daily activities without incident.  SIGNATURES/CONFIDENTIALITY: You and/or your care partner have signed paperwork which will be entered into your electronic medical record.  These signatures attest to the fact that that the information above on your After Visit Summary has been reviewed  and is understood.  Full responsibility of the confidentiality of this discharge information lies with you and/or your care-partner.

## 2011-08-01 NOTE — Op Note (Signed)
[facility] [address]. [place], [place]  [address]  ENDOSCOPY PROCEDURE REPORT  PATIENT:  Sean Maldonado, Sean Maldonado  MR#:  [id number] BIRTHDATE:  Oct 13, 1955, 55 yrs. old  GENDER:  male  ENDOSCOPIST:  Loralee Pacas. Sharlett Iles, MD, [facility] Referred by:  [name], M.D.  PROCEDURE DATE:  08/01/2011 PROCEDURE:  EGD with biopsy, 43239, EGD with biopsy for H. pylori 43239 ASA CLASS:  Class II INDICATIONS:  LUQ PAIN AND HX OF MELENA.  MEDICATIONS:   There was residual sedation effect present from prior procedure., propofol (Diprivan) 150 mg IV TOPICAL ANESTHETIC:  DESCRIPTION OF PROCEDURE:   After the risks and benefits of the procedure were explained, informed consent was obtained.  The LB GIF-H180 [id number] endoscope was introduced through the mouth and advanced to the second portion of the duodenum.  The instrument was slowly withdrawn as the mucosa was fully examined. <<PROCEDUREIMAGES>>  Severe gastritis was found in the body and the antrum of the stomach. LINEAR EROSIONS AND DRIED HEME NOTED.CLO AND REGULAR BIOPSIES DONE.  Otherwise the examination was normal. Retroflexed views revealed no abnormalities.    The scope was then withdrawn from the patient and the procedure completed.  COMPLICATIONS:  None  ENDOSCOPIC IMPRESSION: 1) Severe gastritis in the body and the antrum of the stomach 2) Otherwise normal examination NSAID DAMAGE VS H.PYLORI INFECTION OR COMBO OF BOTH. RECOMMENDATIONS: 1) Await biopsy results 2) Rx CLO if positive 3) continue PPI 4) continue current medications 5) Avoid NSAIDS for two weeks  ______________________________ Loralee Pacas. Sharlett Iles, MD, [facility]  CC:  n. eSIGNED:   Loralee Pacas. Ailea Rhatigan at 08/01/2011 10:01 AM  [place], [place], [phone]

## 2011-08-02 ENCOUNTER — Telehealth: Payer: Self-pay | Admitting: *Deleted

## 2011-08-02 NOTE — Telephone Encounter (Signed)
  Follow up Call-  Call back number 08/01/2011  Post procedure Call Back phone  # [phone]  Permission to leave phone message Yes     Patient questions:  Do you have a fever, pain , or abdominal swelling? no Pain Score  0 *  Have you tolerated food without any problems? yes  Have you been able to return to your normal activities? yes  Do you have any questions about your discharge instructions: Diet   no Medications  no Follow up visit  no  Do you have questions or concerns about your Care? no  Actions: * If pain score is 4 or above: No action needed, pain <4.

## 2011-08-08 ENCOUNTER — Encounter: Payer: Self-pay | Admitting: Gastroenterology

## 2012-07-20 ENCOUNTER — Other Ambulatory Visit: Payer: Self-pay | Admitting: Internal Medicine

## 2012-07-20 DIAGNOSIS — R911 Solitary pulmonary nodule: Secondary | ICD-10-CM

## 2012-07-25 ENCOUNTER — Ambulatory Visit
Admission: RE | Admit: 2012-07-25 | Discharge: 2012-07-25 | Disposition: A | Discharge status: Home / Self Care | Payer: BC Managed Care – PPO | Source: Ambulatory Visit | Attending: Internal Medicine | Admitting: Internal Medicine

## 2012-07-25 DIAGNOSIS — R911 Solitary pulmonary nodule: Secondary | ICD-10-CM

## 2012-07-25 MED ORDER — IOHEXOL 300 MG/ML  SOLN
75.0000 mL | Freq: Once | INTRAMUSCULAR | Status: AC | PRN
  Administered 2012-07-25: 75 mL via INTRAVENOUS

## 2012-07-31 ENCOUNTER — Other Ambulatory Visit: Payer: Self-pay | Admitting: *Deleted

## 2012-07-31 ENCOUNTER — Encounter (INDEPENDENT_AMBULATORY_CARE_PROVIDER_SITE_OTHER): Payer: Self-pay

## 2012-07-31 MED ORDER — OMEPRAZOLE-SODIUM BICARBONATE 40-1100 MG PO CAPS: 1.0000 | ORAL_CAPSULE | Freq: Every day | ORAL | Status: DC

## 2012-08-07 ENCOUNTER — Encounter (INDEPENDENT_AMBULATORY_CARE_PROVIDER_SITE_OTHER): Payer: Self-pay

## 2012-08-07 ENCOUNTER — Ambulatory Visit (INDEPENDENT_AMBULATORY_CARE_PROVIDER_SITE_OTHER): Payer: BC Managed Care – PPO | Admitting: General Surgery

## 2012-08-07 ENCOUNTER — Encounter (INDEPENDENT_AMBULATORY_CARE_PROVIDER_SITE_OTHER): Payer: Self-pay | Admitting: General Surgery

## 2012-08-07 VITALS — BP 144/92 | HR 82 | Resp 18 | Ht 72.0 in | Wt 260.0 lb

## 2012-08-07 DIAGNOSIS — R599 Enlarged lymph nodes, unspecified: Secondary | ICD-10-CM

## 2012-08-07 DIAGNOSIS — R590 Localized enlarged lymph nodes: Secondary | ICD-10-CM

## 2012-08-07 NOTE — Progress Notes (Signed)
Patient ID: Sean Maldonado, male   DOB: Sep 18, 1955, 57 y.o.   MRN: [id number]  Chief Complaint  Patient presents with  . Other    Eval bilateral swollen lymph nodes in axilla    HPI Sean Maldonado is a 57 y.o. male.  Referred by Dr. Melford Aase HPI This is a 57 year old male who I know from a previous hospitalization. He underwent a followup for a lung nodule found on a CT scan and evaluation for his abdominal pain. On the CT scan he is noted to have a small nodule in the right middle lobe being completely stable with no new or enlarging pulmonary nodule. However there are also multiple enlarged axillary lymph nodes bilaterally. The right side measures up to almost 2 cm and 14 mm on the left side. There are also slightly prominent nodes in the superior mediastinum measuring approximately 8 mm. The remaining portion of the CT scan is negative except for multiple gallstones within his gallbladder. He has absolutely no abdominal symptoms at this point. He did not have any B symptoms that he describes today at all. He is otherwise doing very well. He is referred for evaluation of lymphadenopathy that is present at multiple sites. He did get a vaccine recently and wondered if this could be from his vaccine.  Past Medical History  Diagnosis Date  . Hypertension   . Hypercholesteremia   . Arthritis     pt denies  . Seasonal allergies   . Cholelithiasis   . Hepatic steatosis   . Asthma   . Obesity     Past Surgical History  Procedure Laterality Date  . Tonsillectomy    . Wisdom tooth extraction      Family History  Problem Relation Age of Onset  . Diabetes Mother   . Heart disease Mother   . Colon polyps Neg Hx   . Colon cancer Neg Hx     Social History History  Substance Use Topics  . Smoking status: Current Some Day Smoker    Types: Cigars  . Smokeless tobacco: Never Used  . Alcohol Use: Yes     Comment: occasional    No Known Allergies  Current Outpatient Prescriptions    Medication Sig Dispense Refill  . aspirin 81 MG tablet Take 81 mg by mouth daily.      Marland Kitchen atenolol (TENORMIN) 100 MG tablet Take 100 mg by mouth daily.      . Cholecalciferol (VITAMIN D3) 5000 UNITS CAPS Take 1 capsule by mouth daily.      . Multiple Vitamin (MULTIVITAMIN) capsule Take 1 capsule by mouth daily.      Marland Kitchen omeprazole-sodium bicarbonate (ZEGERID) 40-1100 MG per capsule Take 1 capsule by mouth daily before breakfast.  90 capsule  0  . omeprazole-sodium bicarbonate (ZEGERID) 40-1100 MG per capsule Take 1 capsule by mouth daily before breakfast.  20 capsule  0  . omeprazole-sodium bicarbonate (ZEGERID) 40-1100 MG per capsule Take 1 capsule by mouth daily before breakfast.  30 capsule  11  . [DISCONTINUED] ezetimibe (ZETIA) 10 MG tablet Take 10 mg by mouth daily.      . [DISCONTINUED] fenofibrate micronized (LOFIBRA) 134 MG capsule Take 134 mg by mouth daily.       No current facility-administered medications for this visit.    Review of Systems Review of Systems  Constitutional: Negative for fever, chills and unexpected weight change.  HENT: Positive for sinus pressure. Negative for hearing loss, congestion, sore throat, trouble swallowing and voice  change.   Eyes: Negative for visual disturbance.  Respiratory: Negative for cough and wheezing.   Cardiovascular: Negative for chest pain, palpitations and leg swelling.  Gastrointestinal: Negative for nausea, vomiting, abdominal pain, diarrhea, constipation, blood in stool, abdominal distention, anal bleeding and rectal pain.  Genitourinary: Negative for hematuria and difficulty urinating.  Musculoskeletal: Negative for arthralgias.  Skin: Negative for rash and wound.  Neurological: Negative for seizures, syncope, weakness and headaches.  Hematological: Negative for adenopathy. Does not bruise/bleed easily.  Psychiatric/Behavioral: Negative for confusion.    Blood pressure 144/92, pulse 82, resp. rate 18, height 6' (1.829 m),  weight 260 lb (117.935 kg).  Physical Exam Physical Exam  Vitals reviewed. Constitutional: He appears well-developed and well-nourished.  Neck:    Cardiovascular: Normal rate, regular rhythm and normal heart sounds.   Pulmonary/Chest: Effort normal and breath sounds normal. He has no wheezes. He has no rales.  Abdominal: Soft.  Lymphadenopathy:    He has no cervical adenopathy.    He has axillary adenopathy.       Right axillary: Lateral (I think I feel some vague adenopathy but this is difficult due to habitus) adenopathy present. No pectoral adenopathy present.       Left axillary: No pectoral and no lateral adenopathy present.      Right: No supraclavicular adenopathy present.       Left: No supraclavicular adenopathy present.  Skin:       Data Reviewed CT CHEST WITH CONTRAST  Technique: Multidetector CT imaging of the chest was performed  following the standard protocol during bolus administration of  intravenous contrast.  Contrast: 28m OMNIPAQUE IOHEXOL 300 MG/ML SOLN pain  Comparison: CT abdomen pelvis of 06/29/2011  Findings: On the lung window images, the small nodule in the right  middle lobe peripherally subpleural in location is completely  stable. No new or enlarging pulmonary nodule is seen. No  parenchymal infiltrate is noted and there is no evidence of pleural  effusion.  On soft tissue window images, the thyroid gland is normal in size.  However, there are multiple enlarged axillary lymph nodes  bilaterally some of which extend subpectoral particularly on the  right. These nodes measure up to 19 mm in short axis diameter on  the right and 14 mm in short axis diameter on the left. There are  slightly prominent nodes in the superior mediastinum measuring  approximately 8 mm in short axis diameter. Other mediastinal and  hilar nodes are within normal limits in size. The thoracic aorta  opacifies with no significant abnormality. The origins of the  great  vessels are patent. The pulmonary arteries opacify with no  acute abnormality noted. The heart is mildly enlarged. Images  through the upper abdomen show no significant interval change with  multiple gallstones filling the contracted gallbladder. Bone  window images show degenerative change within the mid lower  thoracic spine. No lytic or blastic lesion is seen.  IMPRESSION:  1. Bilateral axillary adenopathy with slightly prominent superior  mediastinal lymph nodes as well. The diffuse nature of this  adenopathy is worrisome for lymphoma or leukemia. Clinical  correlation is recommended.  2. Stable noncalcified small right middle lobe lung nodule of  doubtful significance.  3. Multiple gallstones within the gallbladder.   Assessment    Lymphadenopathy     Plan    We discussed that he certainly could be benign. I cannot tell him that these are from his vaccine at all. I told him that there are  several different options I would end up recommending a lymph node biopsy to ensure that this is not a lymphoma. This is a possibility given the fact that he has the present under both axilla. I think I can palpate the ones in the right axilla as well. We discussed a right axillary lymph node biopsy. The risks of this being bleeding, infection, lymphedema. He understands the postoperative restrictions. I will also excise the skin tag on his neck as well as left axilla at the same time. He has something he wants to do one week and so we'll plan on doing this as soon as that has been completed.       Kalinda Romaniello 08/07/2012, 9:53 AM

## 2012-08-14 ENCOUNTER — Encounter (HOSPITAL_COMMUNITY): Payer: Self-pay | Admitting: Pharmacy Technician

## 2012-08-20 ENCOUNTER — Encounter (HOSPITAL_COMMUNITY): Payer: Self-pay

## 2012-08-20 ENCOUNTER — Encounter (HOSPITAL_COMMUNITY)
Admission: RE | Admit: 2012-08-20 | Discharge: 2012-08-20 | Disposition: A | Discharge status: Home / Self Care | Payer: BC Managed Care – PPO | Source: Ambulatory Visit | Attending: General Surgery | Admitting: General Surgery

## 2012-08-20 HISTORY — DX: Gastro-esophageal reflux disease without esophagitis: K21.9

## 2012-08-20 LAB — CBC WITH DIFFERENTIAL/PLATELET
Basophils Relative: 0 % (ref 0–1)
Eosinophils Absolute: 0.1 10*3/uL (ref 0.0–0.7)
Hemoglobin: 14.4 g/dL (ref 13.0–17.0)
MCH: 30.3 pg (ref 26.0–34.0)
MCHC: 33.9 g/dL (ref 30.0–36.0)
Neutro Abs: 3.2 10*3/uL (ref 1.7–7.7)
Neutrophils Relative %: 60 % (ref 43–77)
Platelets: 225 10*3/uL (ref 150–400)
RBC: 4.76 MIL/uL (ref 4.22–5.81)

## 2012-08-20 LAB — BASIC METABOLIC PANEL
BUN: 13 mg/dL (ref 6–23)
Calcium: 9.2 mg/dL (ref 8.4–10.5)
GFR calc Af Amer: >90 mL/min (ref >90)
GFR calc non Af Amer: >90 mL/min (ref >90)
Glucose, Bld: 95 mg/dL (ref 70–99)
Potassium: 4.2 mEq/L (ref 3.5–5.1)
Sodium: 140 mEq/L (ref 135–145)

## 2012-08-20 LAB — SURGICAL PCR SCREEN
MRSA, PCR: NEGATIVE
Staphylococcus aureus: POSITIVE — AB

## 2012-08-20 NOTE — Progress Notes (Signed)
Chest Ct 3/14 EPIC, EKG 08/05/12 chart, OV Dr Oneta Rack 3/14 with labs on chart

## 2012-08-20 NOTE — Progress Notes (Signed)
08/20/12 1452  OBSTRUCTIVE SLEEP APNEA  Have you ever been diagnosed with sleep apnea through a sleep study? No  Do you snore loudly (loud enough to be heard through closed doors)?  1  Do you often feel tired, fatigued, or sleepy during the daytime? 0  Has anyone observed you stop breathing during your sleep? 0  Do you have, or are you being treated for high blood pressure? 1  BMI more than 35 kg/m2? 1  Age over 56 years old? 1  Neck circumference greater than 40 cm/18 inches? 1  Gender: 1  Obstructive Sleep Apnea Score 6  Score 4 or greater  Results sent to PCP

## 2012-08-20 NOTE — Patient Instructions (Addendum)
[address] JAHN FRANCHINI  08/20/2012   Your procedure is scheduled on:  08/27/12  MONDAY  Report to [facility] at   0730    AM.  Call this number if you have problems the morning of surgery: [phone]       Remember:   Do not eat food  Or drink :After Midnight. SUNDAY NIGHT   Take these medicines the morning of surgery with A SIP OF WATER: Zegrid, ATENOLOL   .  Contacts, dentures or partial plates can not be worn to surgery  Leave suitcase in the car. After surgery it may be brought to your room.  For patients admitted to the hospital, checkout time is 11:00 AM day of  discharge.             SPECIAL INSTRUCTIONS- SEE [place] PREPARING FOR SURGERY INSTRUCTION SHEET-     DO NOT WEAR JEWELRY, LOTIONS, POWDERS, OR PERFUMES.  WOMEN-- DO NOT SHAVE LEGS OR UNDERARMS FOR 12 HOURS BEFORE SHOWERS. MEN MAY SHAVE FACE.  Patients discharged the day of surgery will not be allowed to drive home. IF going home the day of surgery, you must have a driver and someone to stay with you for the first 24 hours  Name and phone number of your driver: Burtis Junes  [phone]                                                                       Please read over the following fact sheets that you were given: MRSA Information, Incentive Spirometry Sheet, Blood Transfusion Sheet  Information                                                                                   Balen Woolum  PST 336  [phone]                 FAILURE TO FOLLOW THESE INSTRUCTIONS MAY RESULT IN  CANCELLATION   OF YOUR SURGERY                                                  Patient Signature _____________________________

## 2012-08-21 ENCOUNTER — Inpatient Hospital Stay (HOSPITAL_COMMUNITY): Admission: RE | Admit: 2012-08-21 | Payer: BC Managed Care – PPO | Source: Ambulatory Visit

## 2012-08-24 NOTE — Progress Notes (Signed)
Patient was notified to come to short stay 0700 08/27/12. OR time changed to 0930

## 2012-08-27 ENCOUNTER — Encounter (HOSPITAL_COMMUNITY): Payer: Self-pay | Admitting: *Deleted

## 2012-08-27 ENCOUNTER — Encounter (HOSPITAL_COMMUNITY): Payer: Self-pay | Admitting: Anesthesiology

## 2012-08-27 ENCOUNTER — Encounter (HOSPITAL_COMMUNITY)
Admission: RE | Disposition: A | Discharge status: Home / Self Care | Payer: Self-pay | Source: Ambulatory Visit | Attending: General Surgery

## 2012-08-27 ENCOUNTER — Ambulatory Visit (HOSPITAL_COMMUNITY)
Admission: RE | Admit: 2012-08-27 | Discharge: 2012-08-27 | Disposition: A | Discharge status: Home / Self Care | Payer: BC Managed Care – PPO | Source: Ambulatory Visit | Attending: General Surgery | Admitting: General Surgery

## 2012-08-27 ENCOUNTER — Ambulatory Visit (HOSPITAL_COMMUNITY): Payer: BC Managed Care – PPO | Admitting: Anesthesiology

## 2012-08-27 ENCOUNTER — Encounter (INDEPENDENT_AMBULATORY_CARE_PROVIDER_SITE_OTHER): Payer: Self-pay | Admitting: General Surgery

## 2012-08-27 DIAGNOSIS — Z7982 Long term (current) use of aspirin: Secondary | ICD-10-CM | POA: Insufficient documentation

## 2012-08-27 DIAGNOSIS — Z79899 Other long term (current) drug therapy: Secondary | ICD-10-CM | POA: Insufficient documentation

## 2012-08-27 DIAGNOSIS — R911 Solitary pulmonary nodule: Secondary | ICD-10-CM | POA: Insufficient documentation

## 2012-08-27 DIAGNOSIS — E7800 Pure hypercholesterolemia, unspecified: Secondary | ICD-10-CM | POA: Insufficient documentation

## 2012-08-27 DIAGNOSIS — K7689 Other specified diseases of liver: Secondary | ICD-10-CM | POA: Insufficient documentation

## 2012-08-27 DIAGNOSIS — I10 Essential (primary) hypertension: Secondary | ICD-10-CM | POA: Insufficient documentation

## 2012-08-27 DIAGNOSIS — R599 Enlarged lymph nodes, unspecified: Secondary | ICD-10-CM

## 2012-08-27 DIAGNOSIS — K8020 Calculus of gallbladder without cholecystitis without obstruction: Secondary | ICD-10-CM | POA: Insufficient documentation

## 2012-08-27 DIAGNOSIS — E669 Obesity, unspecified: Secondary | ICD-10-CM | POA: Insufficient documentation

## 2012-08-27 DIAGNOSIS — L909 Atrophic disorder of skin, unspecified: Secondary | ICD-10-CM | POA: Insufficient documentation

## 2012-08-27 DIAGNOSIS — F17200 Nicotine dependence, unspecified, uncomplicated: Secondary | ICD-10-CM | POA: Insufficient documentation

## 2012-08-27 DIAGNOSIS — K219 Gastro-esophageal reflux disease without esophagitis: Secondary | ICD-10-CM | POA: Insufficient documentation

## 2012-08-27 DIAGNOSIS — J45909 Unspecified asthma, uncomplicated: Secondary | ICD-10-CM | POA: Insufficient documentation

## 2012-08-27 HISTORY — PX: AXILLARY LYMPH NODE BIOPSY: SHX5737

## 2012-08-27 SURGERY — AXILLARY LYMPH NODE BIOPSY
Anesthesia: General | Site: Axilla | Laterality: Right | Wound class: Clean

## 2012-08-27 MED ORDER — BUPIVACAINE HCL (PF) 0.25 % IJ SOLN
INTRAMUSCULAR | Status: AC
  Filled 2012-08-27: qty 30

## 2012-08-27 MED ORDER — ONDANSETRON HCL 4 MG/2ML IJ SOLN
INTRAMUSCULAR | Status: DC | PRN
  Administered 2012-08-27: 4 mg via INTRAVENOUS

## 2012-08-27 MED ORDER — OXYCODONE HCL 5 MG PO TABS: 5.0000 mg | ORAL_TABLET | Freq: Once | ORAL | Status: DC | PRN

## 2012-08-27 MED ORDER — HYDROMORPHONE HCL PF 1 MG/ML IJ SOLN
INTRAMUSCULAR | Status: AC
  Filled 2012-08-27: qty 1

## 2012-08-27 MED ORDER — MORPHINE SULFATE 10 MG/ML IJ SOLN: 2.0000 mg | INTRAMUSCULAR | Status: DC | PRN

## 2012-08-27 MED ORDER — FENTANYL CITRATE 0.05 MG/ML IJ SOLN
INTRAMUSCULAR | Status: DC | PRN
  Administered 2012-08-27: 100 ug via INTRAVENOUS
  Administered 2012-08-27: 50 ug via INTRAVENOUS

## 2012-08-27 MED ORDER — LACTATED RINGERS IV SOLN
INTRAVENOUS | Status: DC
  Administered 2012-08-27: 1000 mL via INTRAVENOUS

## 2012-08-27 MED ORDER — ACETAMINOPHEN 10 MG/ML IV SOLN
INTRAVENOUS | Status: DC | PRN
  Administered 2012-08-27: 1000 mg via INTRAVENOUS

## 2012-08-27 MED ORDER — HYDROMORPHONE HCL PF 1 MG/ML IJ SOLN
0.2500 mg | INTRAMUSCULAR | Status: DC | PRN
  Administered 2012-08-27 (×2): 0.5 mg via INTRAVENOUS

## 2012-08-27 MED ORDER — CEFAZOLIN SODIUM-DEXTROSE 2-3 GM-% IV SOLR
2.0000 g | INTRAVENOUS | Status: AC
  Administered 2012-08-27: 2 g via INTRAVENOUS

## 2012-08-27 MED ORDER — PROPOFOL 10 MG/ML IV BOLUS
INTRAVENOUS | Status: DC | PRN
  Administered 2012-08-27: 200 mg via INTRAVENOUS

## 2012-08-27 MED ORDER — OXYCODONE HCL 5 MG/5ML PO SOLN
5.0000 mg | Freq: Once | ORAL | Status: DC | PRN
  Filled 2012-08-27: qty 5

## 2012-08-27 MED ORDER — ACETAMINOPHEN 10 MG/ML IV SOLN: 1000.0000 mg | Freq: Once | INTRAVENOUS | Status: DC | PRN

## 2012-08-27 MED ORDER — MIDAZOLAM HCL 5 MG/5ML IJ SOLN
INTRAMUSCULAR | Status: DC | PRN
  Administered 2012-08-27: 2 mg via INTRAVENOUS

## 2012-08-27 MED ORDER — ONDANSETRON HCL 4 MG/2ML IJ SOLN: 4.0000 mg | Freq: Four times a day (QID) | INTRAMUSCULAR | Status: DC | PRN

## 2012-08-27 MED ORDER — ACETAMINOPHEN 325 MG PO TABS: 650.0000 mg | ORAL_TABLET | ORAL | Status: DC | PRN

## 2012-08-27 MED ORDER — SODIUM CHLORIDE 0.9 % IJ SOLN: 3.0000 mL | Freq: Two times a day (BID) | INTRAMUSCULAR | Status: DC

## 2012-08-27 MED ORDER — SODIUM CHLORIDE 0.9 % IV SOLN: 250.0000 mL | INTRAVENOUS | Status: DC | PRN

## 2012-08-27 MED ORDER — PROMETHAZINE HCL 25 MG/ML IJ SOLN: 6.2500 mg | INTRAMUSCULAR | Status: DC | PRN

## 2012-08-27 MED ORDER — BUPIVACAINE HCL (PF) 0.25 % IJ SOLN
INTRAMUSCULAR | Status: DC | PRN
  Administered 2012-08-27: 10 mL

## 2012-08-27 MED ORDER — CEFAZOLIN SODIUM-DEXTROSE 2-3 GM-% IV SOLR
INTRAVENOUS | Status: AC
  Filled 2012-08-27: qty 50

## 2012-08-27 MED ORDER — LIDOCAINE HCL (CARDIAC) 20 MG/ML IV SOLN
INTRAVENOUS | Status: DC | PRN
  Administered 2012-08-27: 50 mg via INTRAVENOUS

## 2012-08-27 MED ORDER — OXYCODONE HCL 5 MG PO TABS: 5.0000 mg | ORAL_TABLET | ORAL | Status: DC | PRN

## 2012-08-27 MED ORDER — ACETAMINOPHEN 650 MG RE SUPP
650.0000 mg | RECTAL | Status: DC | PRN
  Filled 2012-08-27: qty 1

## 2012-08-27 MED ORDER — MEPERIDINE HCL 50 MG/ML IJ SOLN: 6.2500 mg | INTRAMUSCULAR | Status: DC | PRN

## 2012-08-27 MED ORDER — OXYCODONE-ACETAMINOPHEN 10-325 MG PO TABS: 1.0000 | ORAL_TABLET | Freq: Four times a day (QID) | ORAL | Status: DC | PRN

## 2012-08-27 MED ORDER — SODIUM CHLORIDE 0.9 % IV SOLN: INTRAVENOUS | Status: DC

## 2012-08-27 MED ORDER — 0.9 % SODIUM CHLORIDE (POUR BTL) OPTIME
TOPICAL | Status: DC | PRN
  Administered 2012-08-27: 1000 mL

## 2012-08-27 MED ORDER — SODIUM CHLORIDE 0.9 % IJ SOLN: 3.0000 mL | INTRAMUSCULAR | Status: DC | PRN

## 2012-08-27 MED ORDER — ACETAMINOPHEN 10 MG/ML IV SOLN
INTRAVENOUS | Status: AC
  Filled 2012-08-27: qty 100

## 2012-08-27 SURGICAL SUPPLY — 54 items
APPLIER CLIP 11 MED OPEN (CLIP)
BENZOIN TINCTURE PRP APPL 2/3 (GAUZE/BANDAGES/DRESSINGS) ×2 IMPLANT
BINDER BREAST LRG (GAUZE/BANDAGES/DRESSINGS) IMPLANT
BINDER BREAST XLRG (GAUZE/BANDAGES/DRESSINGS) IMPLANT
BLADE HEX COATED 2.75 (ELECTRODE) ×2 IMPLANT
BNDG COHESIVE 4X5 TAN STRL (GAUZE/BANDAGES/DRESSINGS) IMPLANT
CANISTER SUCTION 2500CC (MISCELLANEOUS) IMPLANT
CHLORAPREP W/TINT 26ML (MISCELLANEOUS) IMPLANT
CLIP APPLIE 11 MED OPEN (CLIP) IMPLANT
CLOSURE STERI-STRIP 1/4X4 (GAUZE/BANDAGES/DRESSINGS) ×2 IMPLANT
CLOTH BEACON ORANGE TIMEOUT ST (SAFETY) ×2 IMPLANT
COVER MAYO STAND STRL (DRAPES) IMPLANT
COVER SURGICAL LIGHT HANDLE (MISCELLANEOUS) IMPLANT
DERMABOND ADVANCED (GAUZE/BANDAGES/DRESSINGS)
DERMABOND ADVANCED .7 DNX12 (GAUZE/BANDAGES/DRESSINGS) IMPLANT
DRAIN CHANNEL RND F F (WOUND CARE) IMPLANT
DRAPE LAPAROSCOPIC ABDOMINAL (DRAPES) IMPLANT
DRAPE LG THREE QUARTER DISP (DRAPES) IMPLANT
DRAPE ORTHO SPLIT 77X108 STRL (DRAPES)
DRAPE SURG ORHT 6 SPLT 77X108 (DRAPES) IMPLANT
DRSG PAD ABDOMINAL 8X10 ST (GAUZE/BANDAGES/DRESSINGS) IMPLANT
DRSG TEGADERM 4X4.75 (GAUZE/BANDAGES/DRESSINGS) ×2 IMPLANT
ELECT REM PT RETURN 9FT ADLT (ELECTROSURGICAL) ×2
ELECTRODE REM PT RTRN 9FT ADLT (ELECTROSURGICAL) ×1 IMPLANT
EVACUATOR SILICONE 100CC (DRAIN) IMPLANT
GAUZE SPONGE 2X2 8PLY STRL LF (GAUZE/BANDAGES/DRESSINGS) ×1 IMPLANT
GLOVE BIO SURGEON STRL SZ7 (GLOVE) ×2 IMPLANT
GLOVE BIOGEL PI IND STRL 7.5 (GLOVE) ×1 IMPLANT
GLOVE BIOGEL PI INDICATOR 7.5 (GLOVE) ×1
GOWN PREVENTION PLUS LG XLONG (DISPOSABLE) ×2 IMPLANT
GOWN PREVENTION PLUS XLARGE (GOWN DISPOSABLE) ×2 IMPLANT
GOWN STRL NON-REIN LRG LVL3 (GOWN DISPOSABLE) ×2 IMPLANT
GOWN STRL REIN XL XLG (GOWN DISPOSABLE) ×2 IMPLANT
KIT BASIN OR (CUSTOM PROCEDURE TRAY) ×2 IMPLANT
MARKER SKIN DUAL TIP RULER LAB (MISCELLANEOUS) ×2 IMPLANT
NS IRRIG 1000ML POUR BTL (IV SOLUTION) ×2 IMPLANT
PACK GENERAL/GYN (CUSTOM PROCEDURE TRAY) ×2 IMPLANT
SPONGE DRAIN TRACH 4X4 STRL 2S (GAUZE/BANDAGES/DRESSINGS) IMPLANT
SPONGE GAUZE 2X2 STER 10/PKG (GAUZE/BANDAGES/DRESSINGS) ×1
SPONGE GAUZE 4X4 12PLY (GAUZE/BANDAGES/DRESSINGS) ×2 IMPLANT
SPONGE LAP 18X18 X RAY DECT (DISPOSABLE) IMPLANT
STAPLER VISISTAT 35W (STAPLE) ×2 IMPLANT
STOCKINETTE 6  STRL (DRAPES)
STOCKINETTE 6 STRL (DRAPES) IMPLANT
STOCKINETTE 8 INCH (MISCELLANEOUS) IMPLANT
STRIP CLOSURE SKIN 1/2X4 (GAUZE/BANDAGES/DRESSINGS) ×2 IMPLANT
SUT MNCRL AB 4-0 PS2 18 (SUTURE) ×2 IMPLANT
SUT MON AB 5-0 PS2 18 (SUTURE) IMPLANT
SUT VIC AB 2-0 BRD 54 (SUTURE) IMPLANT
SUT VIC AB 2-0 SH 27 (SUTURE) ×1
SUT VIC AB 2-0 SH 27X BRD (SUTURE) ×1 IMPLANT
SUT VIC AB 3-0 SH 8-18 (SUTURE) ×2 IMPLANT
TOWEL OR 17X26 10 PK STRL BLUE (TOWEL DISPOSABLE) ×2 IMPLANT
TRAY FOLEY CATH 14FRSI W/METER (CATHETERS) IMPLANT

## 2012-08-27 NOTE — Op Note (Signed)
Preoperative diagnosis: Multiple sites of adenopathy, right axillary adenopathy Postoperative diagnosis: Same as above Procedure: Right axillary lymph node biopsy Surgeon: Dr. Serita Grammes Anesthesia: Gen. With LMA Complications: None Estimated blood loss: Minimal Specimens: Right axillary lymph nodes to pathology who confirmed there is enough tissue available for diagnosis Drains: None Sponge needle count correct x2 and the operation Disposition to recovery in stable condition  Indications: This is a 57 year old male who underwent a recent evaluation where he was found to have axillary adenopathy bilaterally as well as some mediastinal adenopathy. He was then referred for evaluation for biopsy. He and I discussed a right axillary lymph node biopsy as these were the lymph nodes that appear to be largest on his examination. These also appeared to be largest on CT scan.  Procedure: After informed consent was obtained the patient was taken to the operating room. He was administered 2 g of intravenous cefazolin. Sequential compression devices were on his legs. He was then placed under general anesthesia with an LMA. His right axilla was then prepped and draped in the standard sterile surgical fashion. Surgical timeout was then performed.  I made a 3 cm incision below his axillary hairline. I carried this through the axillary fascia. Immediately upon entering I noted some enlarged nodes. I removed what appeared to be 2 enlarged nodes and these are passed off the table to pathology. They also confirmed and the nodal tissue was available for diagnosis. There was a small vein that was bleeding and I oversewed this with a 3-0 Vicryl suture. Hemostasis was then observed. I closed the axillary fascia with a 2-0 Vicryl.  I then closed the dermis with a 3-0 Vicryl and the skin with a 4 Monocryl. I infiltrated quarter percent Marcaine throughout the wound. Steri-Strips and a sterile dressing were then placed. He  tolerated this well was extubated and transferred to recovery in stable condition.

## 2012-08-27 NOTE — Anesthesia Postprocedure Evaluation (Signed)
Anesthesia Post Note  Patient: Sean Maldonado  Procedure(s) Performed: Procedure(s) (LRB): AXILLARY LYMPH NODE BIOPSY  (Right)  Anesthesia type: General  Patient location: PACU  Post pain: Pain level controlled  Post assessment: Post-op Vital signs reviewed  Last Vitals: BP 134/92  Pulse 65  Temp(Src) 36.8 C (Oral)  Resp 13  SpO2 95%  Post vital signs: Reviewed  Level of consciousness: sedated  Complications: No apparent anesthesia complications

## 2012-08-27 NOTE — H&P (View-Only) (Signed)
Patient ID: Sean Maldonado, male   DOB: 26-Oct-1955, 57 y.o.   MRN: [id number]  Chief Complaint  Patient presents with  . Other    Eval bilateral swollen lymph nodes in axilla    HPI Sean Maldonado is a 57 y.o. male.  Referred by Dr. Melford Aase HPI This is a 57 year old male who I know from a previous hospitalization. He underwent a followup for a lung nodule found on a CT scan and evaluation for his abdominal pain. On the CT scan he is noted to have a small nodule in the right middle lobe being completely stable with no new or enlarging pulmonary nodule. However there are also multiple enlarged axillary lymph nodes bilaterally. The right side measures up to almost 2 cm and 14 mm on the left side. There are also slightly prominent nodes in the superior mediastinum measuring approximately 8 mm. The remaining portion of the CT scan is negative except for multiple gallstones within his gallbladder. He has absolutely no abdominal symptoms at this point. He did not have any B symptoms that he describes today at all. He is otherwise doing very well. He is referred for evaluation of lymphadenopathy that is present at multiple sites. He did get a vaccine recently and wondered if this could be from his vaccine.  Past Medical History  Diagnosis Date  . Hypertension   . Hypercholesteremia   . Arthritis     pt denies  . Seasonal allergies   . Cholelithiasis   . Hepatic steatosis   . Asthma   . Obesity     Past Surgical History  Procedure Laterality Date  . Tonsillectomy    . Wisdom tooth extraction      Family History  Problem Relation Age of Onset  . Diabetes Mother   . Heart disease Mother   . Colon polyps Neg Hx   . Colon cancer Neg Hx     Social History History  Substance Use Topics  . Smoking status: Current Some Day Smoker    Types: Cigars  . Smokeless tobacco: Never Used  . Alcohol Use: Yes     Comment: occasional    No Known Allergies  Current Outpatient Prescriptions    Medication Sig Dispense Refill  . aspirin 81 MG tablet Take 81 mg by mouth daily.      Marland Kitchen atenolol (TENORMIN) 100 MG tablet Take 100 mg by mouth daily.      . Cholecalciferol (VITAMIN D3) 5000 UNITS CAPS Take 1 capsule by mouth daily.      . Multiple Vitamin (MULTIVITAMIN) capsule Take 1 capsule by mouth daily.      Marland Kitchen omeprazole-sodium bicarbonate (ZEGERID) 40-1100 MG per capsule Take 1 capsule by mouth daily before breakfast.  90 capsule  0  . omeprazole-sodium bicarbonate (ZEGERID) 40-1100 MG per capsule Take 1 capsule by mouth daily before breakfast.  20 capsule  0  . omeprazole-sodium bicarbonate (ZEGERID) 40-1100 MG per capsule Take 1 capsule by mouth daily before breakfast.  30 capsule  11  . [DISCONTINUED] ezetimibe (ZETIA) 10 MG tablet Take 10 mg by mouth daily.      . [DISCONTINUED] fenofibrate micronized (LOFIBRA) 134 MG capsule Take 134 mg by mouth daily.       No current facility-administered medications for this visit.    Review of Systems Review of Systems  Constitutional: Negative for fever, chills and unexpected weight change.  HENT: Positive for sinus pressure. Negative for hearing loss, congestion, sore throat, trouble swallowing and voice  change.   Eyes: Negative for visual disturbance.  Respiratory: Negative for cough and wheezing.   Cardiovascular: Negative for chest pain, palpitations and leg swelling.  Gastrointestinal: Negative for nausea, vomiting, abdominal pain, diarrhea, constipation, blood in stool, abdominal distention, anal bleeding and rectal pain.  Genitourinary: Negative for hematuria and difficulty urinating.  Musculoskeletal: Negative for arthralgias.  Skin: Negative for rash and wound.  Neurological: Negative for seizures, syncope, weakness and headaches.  Hematological: Negative for adenopathy. Does not bruise/bleed easily.  Psychiatric/Behavioral: Negative for confusion.    Blood pressure 144/92, pulse 82, resp. rate 18, height 6' (1.829 m),  weight 260 lb (117.935 kg).  Physical Exam Physical Exam  Vitals reviewed. Constitutional: He appears well-developed and well-nourished.  Neck:    Cardiovascular: Normal rate, regular rhythm and normal heart sounds.   Pulmonary/Chest: Effort normal and breath sounds normal. He has no wheezes. He has no rales.  Abdominal: Soft.  Lymphadenopathy:    He has no cervical adenopathy.    He has axillary adenopathy.       Right axillary: Lateral (I think I feel some vague adenopathy but this is difficult due to habitus) adenopathy present. No pectoral adenopathy present.       Left axillary: No pectoral and no lateral adenopathy present.      Right: No supraclavicular adenopathy present.       Left: No supraclavicular adenopathy present.  Skin:       Data Reviewed CT CHEST WITH CONTRAST  Technique: Multidetector CT imaging of the chest was performed  following the standard protocol during bolus administration of  intravenous contrast.  Contrast: 35m OMNIPAQUE IOHEXOL 300 MG/ML SOLN pain  Comparison: CT abdomen pelvis of 06/29/2011  Findings: On the lung window images, the small nodule in the right  middle lobe peripherally subpleural in location is completely  stable. No new or enlarging pulmonary nodule is seen. No  parenchymal infiltrate is noted and there is no evidence of pleural  effusion.  On soft tissue window images, the thyroid gland is normal in size.  However, there are multiple enlarged axillary lymph nodes  bilaterally some of which extend subpectoral particularly on the  right. These nodes measure up to 19 mm in short axis diameter on  the right and 14 mm in short axis diameter on the left. There are  slightly prominent nodes in the superior mediastinum measuring  approximately 8 mm in short axis diameter. Other mediastinal and  hilar nodes are within normal limits in size. The thoracic aorta  opacifies with no significant abnormality. The origins of the  great  vessels are patent. The pulmonary arteries opacify with no  acute abnormality noted. The heart is mildly enlarged. Images  through the upper abdomen show no significant interval change with  multiple gallstones filling the contracted gallbladder. Bone  window images show degenerative change within the mid lower  thoracic spine. No lytic or blastic lesion is seen.  IMPRESSION:  1. Bilateral axillary adenopathy with slightly prominent superior  mediastinal lymph nodes as well. The diffuse nature of this  adenopathy is worrisome for lymphoma or leukemia. Clinical  correlation is recommended.  2. Stable noncalcified small right middle lobe lung nodule of  doubtful significance.  3. Multiple gallstones within the gallbladder.   Assessment    Lymphadenopathy     Plan    We discussed that he certainly could be benign. I cannot tell him that these are from his vaccine at all. I told him that there are  several different options I would end up recommending a lymph node biopsy to ensure that this is not a lymphoma. This is a possibility given the fact that he has the present under both axilla. I think I can palpate the ones in the right axilla as well. We discussed a right axillary lymph node biopsy. The risks of this being bleeding, infection, lymphedema. He understands the postoperative restrictions. I will also excise the skin tag on his neck as well as left axilla at the same time. He has something he wants to do one week and so we'll plan on doing this as soon as that has been completed.       Kahla Risdon 08/07/2012, 9:53 AM

## 2012-08-27 NOTE — Interval H&P Note (Signed)
History and Physical Interval Note:  08/27/2012 9:30 AM  [place]  has presented today for surgery, with the diagnosis of RIGHT AXILLARY LAD   The various methods of treatment have been discussed with the patient and family. After consideration of risks, benefits and other options for treatment, the patient has consented to  Procedure(s): AXILLARY LYMPH NODE BIOPSY  (Right) as a surgical intervention .  The patient's history has been reviewed, patient examined, no change in status, stable for surgery.  I have reviewed the patient's chart and labs.  Questions were answered to the patient's satisfaction.     Jacai Kipp

## 2012-08-27 NOTE — Anesthesia Preprocedure Evaluation (Addendum)
Anesthesia Evaluation  Patient identified by MRN, date of birth, ID band Patient awake    Reviewed: Allergy & Precautions, H&P , NPO status , Patient's Chart, lab work & pertinent test results  Airway Mallampati: III TM Distance: >3 FB Neck ROM: Full  Mouth opening: Limited Mouth Opening  Dental  (+) Dental Advisory Given   Pulmonary asthma ,  breath sounds clear to auscultation  Pulmonary exam normal       Cardiovascular hypertension, Pt. on medications negative cardio ROS  + dysrhythmias Rhythm:Regular Rate:Normal     Neuro/Psych negative neurological ROS  negative psych ROS   GI/Hepatic negative GI ROS, GERD-  Medicated,Fatty liver   Endo/Other  negative endocrine ROS  Renal/GU negative Renal ROS     Musculoskeletal negative musculoskeletal ROS (+)   Abdominal   Peds  Hematology negative hematology ROS (+)   Anesthesia Other Findings   Reproductive/Obstetrics negative OB ROS                        Anesthesia Physical Anesthesia Plan  ASA: II  Anesthesia Plan: General   Post-op Pain Management:    Induction: Intravenous  Airway Management Planned: LMA and Oral ETT  Additional Equipment:   Intra-op Plan:   Post-operative Plan: Extubation in OR  Informed Consent: I have reviewed the patients History and Physical, chart, labs and discussed the procedure including the risks, benefits and alternatives for the proposed anesthesia with the patient or authorized representative who has indicated his/her understanding and acceptance.   Dental advisory given  Plan Discussed with: CRNA  Anesthesia Plan Comments:        Anesthesia Quick Evaluation

## 2012-08-27 NOTE — Transfer of Care (Signed)
Immediate Anesthesia Transfer of Care Note  Patient: [facility]  Procedure(s) Performed: Procedure(s): AXILLARY LYMPH NODE BIOPSY  (Right)  Patient Location: PACU  Anesthesia Type:General  Level of Consciousness: awake, alert  and oriented  Airway & Oxygen Therapy: Patient Spontanous Breathing and Patient connected to face mask oxygen  Post-op Assessment: Report given to PACU RN and Post -op Vital signs reviewed and stable  Post vital signs: Reviewed and stable  Complications: No apparent anesthesia complications

## 2012-08-28 ENCOUNTER — Telehealth (INDEPENDENT_AMBULATORY_CARE_PROVIDER_SITE_OTHER): Payer: Self-pay | Admitting: General Surgery

## 2012-08-28 ENCOUNTER — Encounter (HOSPITAL_COMMUNITY): Payer: Self-pay | Admitting: General Surgery

## 2012-08-28 NOTE — Telephone Encounter (Signed)
Spoke with patient he is aware of appt date on 09/14/12 at 11am

## 2012-09-14 ENCOUNTER — Ambulatory Visit (INDEPENDENT_AMBULATORY_CARE_PROVIDER_SITE_OTHER): Payer: BC Managed Care – PPO | Admitting: General Surgery

## 2012-09-14 ENCOUNTER — Encounter (INDEPENDENT_AMBULATORY_CARE_PROVIDER_SITE_OTHER): Payer: Self-pay | Admitting: General Surgery

## 2012-09-14 VITALS — BP 128/76 | HR 72 | Temp 97.6°F | Resp 16 | Ht 72.0 in | Wt 260.4 lb

## 2012-09-14 DIAGNOSIS — Z09 Encounter for follow-up examination after completed treatment for conditions other than malignant neoplasm: Secondary | ICD-10-CM

## 2012-09-14 NOTE — Progress Notes (Signed)
Subjective:     Patient ID: Sean Maldonado, male   DOB: [birth year], 57 y.o.   MRN: [id number]  HPI This is a 57 year old male I saw with multiple sites of lymphadenopathy. I recently did a right axillary node biopsy. This returns as lymphoid hyperplasia with dermatopathic changes. There is no evidence of a lymphoproliferative process, metastatic malignancy, or granulomas. He has done well postoperatively he returns today without complaints.  Review of Systems     Objective:   Physical Exam Right axillary incision clean without infection    Assessment:     S/p right axillary node biopsy     Plan:     His pathology is benign. I returned him to full activity and recommend he followup with his regular physician. I also told him if he wants a skin tag removed I can do this in the office at some point he will call back.

## 2012-12-26 ENCOUNTER — Observation Stay (HOSPITAL_COMMUNITY)
Admission: EM | Admit: 2012-12-26 | Discharge: 2012-12-29 | Disposition: A | Discharge status: Home / Self Care | Payer: BC Managed Care – PPO | Attending: Surgery | Admitting: Surgery

## 2012-12-26 ENCOUNTER — Encounter (HOSPITAL_COMMUNITY): Payer: Self-pay | Admitting: *Deleted

## 2012-12-26 ENCOUNTER — Emergency Department (HOSPITAL_COMMUNITY): Payer: BC Managed Care – PPO

## 2012-12-26 DIAGNOSIS — R1011 Right upper quadrant pain: Secondary | ICD-10-CM | POA: Insufficient documentation

## 2012-12-26 DIAGNOSIS — R110 Nausea: Secondary | ICD-10-CM

## 2012-12-26 DIAGNOSIS — K7581 Nonalcoholic steatohepatitis (NASH): Secondary | ICD-10-CM

## 2012-12-26 DIAGNOSIS — K8010 Calculus of gallbladder with chronic cholecystitis without obstruction: Principal | ICD-10-CM | POA: Insufficient documentation

## 2012-12-26 DIAGNOSIS — R109 Unspecified abdominal pain: Secondary | ICD-10-CM

## 2012-12-26 DIAGNOSIS — Z1211 Encounter for screening for malignant neoplasm of colon: Secondary | ICD-10-CM

## 2012-12-26 DIAGNOSIS — E669 Obesity, unspecified: Secondary | ICD-10-CM | POA: Insufficient documentation

## 2012-12-26 DIAGNOSIS — K8020 Calculus of gallbladder without cholecystitis without obstruction: Secondary | ICD-10-CM

## 2012-12-26 DIAGNOSIS — K2901 Acute gastritis with bleeding: Secondary | ICD-10-CM

## 2012-12-26 DIAGNOSIS — E876 Hypokalemia: Secondary | ICD-10-CM | POA: Insufficient documentation

## 2012-12-26 DIAGNOSIS — K219 Gastro-esophageal reflux disease without esophagitis: Secondary | ICD-10-CM

## 2012-12-26 DIAGNOSIS — K921 Melena: Secondary | ICD-10-CM

## 2012-12-26 DIAGNOSIS — E7800 Pure hypercholesterolemia, unspecified: Secondary | ICD-10-CM | POA: Insufficient documentation

## 2012-12-26 DIAGNOSIS — I10 Essential (primary) hypertension: Secondary | ICD-10-CM | POA: Insufficient documentation

## 2012-12-26 LAB — COMPREHENSIVE METABOLIC PANEL
ALT: 80 U/L — ABNORMAL HIGH (ref 0–53)
AST: 73 U/L — ABNORMAL HIGH (ref 0–37)
Alkaline Phosphatase: 51 U/L (ref 39–117)
CO2: 23 mEq/L (ref 19–32)
Chloride: 97 mEq/L (ref 96–112)
GFR calc non Af Amer: >90 mL/min (ref >90)
Potassium: 3.4 mEq/L — ABNORMAL LOW (ref 3.5–5.1)
Sodium: 133 mEq/L — ABNORMAL LOW (ref 135–145)
Total Bilirubin: 0.5 mg/dL (ref 0.3–1.2)

## 2012-12-26 LAB — CBC WITH DIFFERENTIAL/PLATELET
Basophils Absolute: 0 10*3/uL (ref 0.0–0.1)
Lymphocytes Relative: 23 % (ref 12–46)
Lymphs Abs: 1.9 10*3/uL (ref 0.7–4.0)
Neutro Abs: 5.9 10*3/uL (ref 1.7–7.7)
Platelets: 204 10*3/uL (ref 150–400)
RBC: 4.43 MIL/uL (ref 4.22–5.81)
RDW: 12.6 % (ref 11.5–15.5)
WBC: 8.5 10*3/uL (ref 4.0–10.5)

## 2012-12-26 LAB — URINALYSIS, ROUTINE W REFLEX MICROSCOPIC
Bilirubin Urine: NEGATIVE
Glucose, UA: NEGATIVE mg/dL
Hgb urine dipstick: NEGATIVE
Ketones, ur: NEGATIVE mg/dL
Protein, ur: NEGATIVE mg/dL

## 2012-12-26 MED ORDER — FENTANYL CITRATE 0.05 MG/ML IJ SOLN: 100.0000 ug | Freq: Once | INTRAMUSCULAR | Status: DC

## 2012-12-26 MED ORDER — SODIUM CHLORIDE 0.9 % IV BOLUS (SEPSIS)
500.0000 mL | Freq: Once | INTRAVENOUS | Status: AC
  Administered 2012-12-26: 500 mL via INTRAVENOUS

## 2012-12-26 MED ORDER — ONDANSETRON HCL 4 MG/2ML IJ SOLN
4.0000 mg | Freq: Once | INTRAMUSCULAR | Status: AC
  Administered 2012-12-26: 4 mg via INTRAVENOUS
  Filled 2012-12-26: qty 2

## 2012-12-26 MED ORDER — HYDROMORPHONE HCL PF 1 MG/ML IJ SOLN
1.0000 mg | Freq: Once | INTRAMUSCULAR | Status: AC
  Administered 2012-12-26: 1 mg via INTRAVENOUS
  Filled 2012-12-26: qty 1

## 2012-12-26 MED ORDER — FENTANYL CITRATE 0.05 MG/ML IJ SOLN: 50.0000 ug | INTRAMUSCULAR | Status: DC | PRN

## 2012-12-26 MED ORDER — FENTANYL CITRATE 0.05 MG/ML IJ SOLN
100.0000 ug | Freq: Once | INTRAMUSCULAR | Status: DC
  Filled 2012-12-26: qty 2

## 2012-12-26 NOTE — ED Notes (Signed)
PT states that he has a history of gallstones and has had a couple of "gallbladder attacks"; pt states that this feels similar to previous attacks; pt states that he began hurting last pm and took some medication without improvement.

## 2012-12-26 NOTE — ED Provider Notes (Addendum)
CSN: [id number]     Arrival date & time 12/26/12  1941 History     First MD Initiated Contact with Patient 12/26/12 2019     Chief Complaint  Patient presents with  . Abdominal Pain   (Consider location/radiation/quality/duration/timing/severity/associated sxs/prior Treatment) The history is provided by the patient and medical records. No language interpreter was used.    [address] Judie Petit Layton is a 57 y.o. male  with a hx of retention, high cholesterol, arthritis, cholelithiasis, hepatic steatosis, asthma, obesity, GERD, right bundle branch block presents to the Emergency Department complaining of gradual, persistent, progressively worsening right upper quadrant, epigastric and generalized abdominal pain beginning yesterday evening. Patient states his a history of cholelithiasis for which he has seen Dr. Dwain Sarna. The last time he had an attack it was decided not to remove his gallbladder. He states he's had several small attacks at that time but they have resolved on their own. Patient states he's been unable to control his pain at home and oxycodone. He reports significant nausea without emesis.  Associated symptoms include nausea and abdominal pain, nausea.  Pt denies fever, chills, headache, neck pain, chest pain, shortness of breath, vomiting, diarrhea, weakness, dizziness, syncope, dysuria, hematuria.    Past Medical History  Diagnosis Date  . Hypertension   . Hypercholesteremia   . Arthritis     pt denies  . Seasonal allergies   . Cholelithiasis   . Hepatic steatosis   . Asthma   . Obesity   . GERD (gastroesophageal reflux disease)   . Dysrhythmia     RBBB-incomplete   Past Surgical History  Procedure Laterality Date  . Tonsillectomy    . Wisdom tooth extraction    . Axillary lymph node biopsy Right 08/27/2012    Procedure: AXILLARY LYMPH NODE BIOPSY ;  Surgeon: Emelia Loron, MD;  Location: [facility] ORS;  Service: General;  Laterality: Right;   Family History  Problem Relation  Age of Onset  . Diabetes Mother   . Heart disease Mother   . Colon polyps Neg Hx   . Colon cancer Neg Hx    History  Substance Use Topics  . Smoking status: Never Smoker   . Smokeless tobacco: Never Used  . Alcohol Use: Yes     Comment: occasional    Review of Systems  Constitutional: Negative for fever, diaphoresis, appetite change, fatigue and unexpected weight change.  HENT: Negative for mouth sores, trouble swallowing, neck pain and neck stiffness.   Respiratory: Negative for cough, chest tightness, shortness of breath, wheezing and stridor.   Cardiovascular: Negative for chest pain and palpitations.  Gastrointestinal: Positive for nausea and abdominal pain. Negative for vomiting, diarrhea, constipation, blood in stool, abdominal distention and rectal pain.  Genitourinary: Negative for dysuria, urgency, frequency, hematuria, flank pain and difficulty urinating.  Musculoskeletal: Negative for back pain.  Skin: Negative for rash.  Neurological: Negative for weakness.  Hematological: Negative for adenopathy.  Psychiatric/Behavioral: Negative for confusion.  All other systems reviewed and are negative.    Allergies  Review of patient's allergies indicates no known allergies.  Home Medications   Current Outpatient Rx  Name  Route  Sig  Dispense  Refill  . aspirin 81 MG tablet   Oral   Take 81 mg by mouth daily.         Marland Kitchen atenolol (TENORMIN) 100 MG tablet   Oral   Take 50 mg by mouth daily with breakfast.          . Cholecalciferol (VITAMIN  D3) 5000 UNITS CAPS   Oral   Take 1 capsule by mouth daily.         Marland Kitchen ezetimibe (ZETIA) 10 MG tablet   Oral   Take 10 mg by mouth every morning.         . fexofenadine (ALLEGRA) 180 MG tablet   Oral   Take 180 mg by mouth daily as needed (allergies).         Marland Kitchen lisinopril-hydrochlorothiazide (PRINZIDE,ZESTORETIC) 20-12.5 MG per tablet   Oral   Take 1 tablet by mouth daily with breakfast.          . Multiple  Vitamin (MULTIVITAMIN) capsule   Oral   Take 1 capsule by mouth daily.         Marland Kitchen omeprazole-sodium bicarbonate (ZEGERID) 40-1100 MG per capsule   Oral   Take 1 capsule by mouth daily before breakfast.         . oxyCODONE-acetaminophen (PERCOCET) 10-325 MG per tablet   Oral   Take 1 tablet by mouth every 6 (six) hours as needed for pain.   20 tablet   0    BP 154/94  Pulse 68  Temp(Src) 98.3 F (36.8 C) (Oral)  Resp 20  Ht 6' (1.829 m)  Wt 250 lb (113.399 kg)  BMI 33.9 kg/m2  SpO2 93% Physical Exam  Nursing note and vitals reviewed. Constitutional: He is oriented to person, place, and time. He appears well-developed and well-nourished.  HENT:  Head: Normocephalic and atraumatic.  Mouth/Throat: Oropharynx is clear and moist.  Eyes: Conjunctivae and EOM are normal. Pupils are equal, round, and reactive to light. No scleral icterus.  Neck: Normal range of motion.  Cardiovascular: Normal rate, regular rhythm, normal heart sounds and intact distal pulses.   No murmur heard. Pulmonary/Chest: Effort normal and breath sounds normal. No respiratory distress. He has no wheezes.  Abdominal: Soft. Normal appearance and bowel sounds are normal. He exhibits no distension and no mass. There is tenderness in the right upper quadrant and epigastric area. There is guarding. There is no rigidity, no rebound, no CVA tenderness and no tenderness at McBurney's point.  Generalized tenderness with increased tenderness in the RUQ and epigastric area  Musculoskeletal: Normal range of motion. He exhibits no tenderness.  Lymphadenopathy:    He has no cervical adenopathy.  Neurological: He is alert and oriented to person, place, and time. He exhibits normal muscle tone. Coordination normal.  Skin: Skin is warm and dry. No rash noted. No erythema.  Psychiatric: He has a normal mood and affect. His behavior is normal.    ED Course   Procedures (including critical care time)  Labs Reviewed   COMPREHENSIVE METABOLIC PANEL - Abnormal; Notable for the following:    Sodium 133 (*)    Potassium 3.4 (*)    Glucose, Bld 131 (*)    AST 73 (*)    ALT 80 (*)    All other components within normal limits  CBC WITH DIFFERENTIAL  LIPASE, BLOOD  URINALYSIS, ROUTINE W REFLEX MICROSCOPIC   US Abdomen Complete  12/26/2012   *RADIOLOGY REPORT*  Clinical Data:  Abdominal pain.  History of gallstones.  COMPLETE ABDOMINAL ULTRASOUND  Comparison:  Ultrasound dated 06/29/2011  Findings:  Gallbladder:  There are numerous gallstones.  There is no gallbladder wall thickening.  Negative sonographic Murphy's sign.  Common bile duct:  Normal.  4.6 mm in diameter.  Liver:  Slight increased echogenicity of the liver parenchyma consistent with hepatic steatosis, without focal  lesions.  IVC:  Normal.  Pancreas:  The head and body of the pancreas are normal.  The tail is obscured by bowel gas.  Spleen:  Normal.  9.0 cm in length.  Right Kidney:  Normal.  10.5 cm in length.  Left Kidney:  Normal.  11.2 cm in length.  Abdominal aorta:  Normal.  2.4 cm maximal diameter.  IMPRESSION: Numerous gallstones.  Hepatic steatosis.   Original Report Authenticated By: Francene Boyers, M.D.   ECG:  Date: 12/27/2012  Rate: 62  Rhythm: normal sinus rhythm  QRS Axis: normal  Intervals: normal  ST/T Wave abnormalities: normal  Conduction Disutrbances:right bundle branch block  Narrative Interpretation: nonischemic ECG, RBBB, unchanged from 06/29/11  Old EKG Reviewed: unchanged    1. Abdominal pain   2. Cholelithiasis   3. Nausea     MDM  [facility] presents for right upper quadrant and generalized abdominal pain with history of cholelithiasis.  Is afebrile, nontoxic her cardiac. Low concern for cholecystitis versus choledocholithiasis. Will obtain basic labs and ultrasound.  10:32 PM Patient with several doses of Dilaudid and only moderate pain control. Persistent nausea without vomiting.  Ultrasound without evidence  of cholecystitis or choledocholithiasis but is no numerous gallstones.  CBC without evidence of leukocytosis, CMP with mild hypokalemia at 3.4, normal lipase and UA without evidence of urinary tract infection. Will consult Dr. Biagio Quint for surgical evaluation.  11:33 PM Discussed with Dr. Biagio Quint who will evaluate the patient for possible cholecystectomy. Patient pain control redosed. Nausea is under control at this time.  Last oral intake was 6pm tonight.  Dr. Baxter Hire Ward was consulted and agrees with the plan.     Dahlia Client Brison Fiumara, PA-C 12/26/12 2334  Dierdre Forth, PA-C 12/26/12 2349  Dierdre Forth, PA-C 12/27/12 1610

## 2012-12-26 NOTE — ED Provider Notes (Signed)
Medical screening examination/treatment/procedure(s) were performed by non-physician practitioner and as supervising physician I was immediately available for consultation/collaboration.  [place], DO 12/26/12 2350

## 2012-12-27 DIAGNOSIS — R1011 Right upper quadrant pain: Secondary | ICD-10-CM

## 2012-12-27 DIAGNOSIS — K8020 Calculus of gallbladder without cholecystitis without obstruction: Secondary | ICD-10-CM

## 2012-12-27 MED ORDER — PANTOPRAZOLE SODIUM 40 MG PO TBEC
40.0000 mg | DELAYED_RELEASE_TABLET | Freq: Every day | ORAL | Status: DC
  Administered 2012-12-27 – 2012-12-28 (×2): 40 mg via ORAL
  Filled 2012-12-27 (×3): qty 1

## 2012-12-27 MED ORDER — ONDANSETRON HCL 4 MG/2ML IJ SOLN: 4.0000 mg | Freq: Four times a day (QID) | INTRAMUSCULAR | Status: DC | PRN

## 2012-12-27 MED ORDER — HYDROCHLOROTHIAZIDE 12.5 MG PO CAPS
12.5000 mg | ORAL_CAPSULE | Freq: Every day | ORAL | Status: DC
  Administered 2012-12-27 – 2012-12-28 (×2): 12.5 mg via ORAL
  Filled 2012-12-27 (×3): qty 1

## 2012-12-27 MED ORDER — KCL IN DEXTROSE-NACL 20-5-0.45 MEQ/L-%-% IV SOLN
INTRAVENOUS | Status: DC
  Administered 2012-12-27 (×3): via INTRAVENOUS
  Filled 2012-12-27 (×5): qty 1000

## 2012-12-27 MED ORDER — SODIUM CHLORIDE 0.9 % IV SOLN
3.0000 g | INTRAVENOUS | Status: AC
  Administered 2012-12-28: 3 g via INTRAVENOUS
  Filled 2012-12-27: qty 3

## 2012-12-27 MED ORDER — MORPHINE SULFATE 2 MG/ML IJ SOLN
1.0000 mg | INTRAMUSCULAR | Status: DC | PRN
  Administered 2012-12-27 (×3): 2 mg via INTRAVENOUS
  Filled 2012-12-27 (×3): qty 1

## 2012-12-27 MED ORDER — LISINOPRIL 20 MG PO TABS
20.0000 mg | ORAL_TABLET | Freq: Every day | ORAL | Status: DC
  Administered 2012-12-27 – 2012-12-28 (×2): 20 mg via ORAL
  Filled 2012-12-27 (×3): qty 1

## 2012-12-27 MED ORDER — ATENOLOL 50 MG PO TABS
50.0000 mg | ORAL_TABLET | Freq: Every day | ORAL | Status: DC
  Administered 2012-12-27 – 2012-12-29 (×2): 50 mg via ORAL
  Filled 2012-12-27 (×4): qty 1

## 2012-12-27 MED ORDER — ENOXAPARIN SODIUM 40 MG/0.4ML ~~LOC~~ SOLN
40.0000 mg | Freq: Once | SUBCUTANEOUS | Status: AC
  Administered 2012-12-27: 40 mg via SUBCUTANEOUS
  Filled 2012-12-27: qty 0.4

## 2012-12-27 MED ORDER — FENTANYL CITRATE 0.05 MG/ML IJ SOLN: 50.0000 ug | INTRAMUSCULAR | Status: DC | PRN

## 2012-12-27 NOTE — ED Notes (Signed)
Pt states he does not want the fentanyl at this time, states the dilaudid is working at this time

## 2012-12-27 NOTE — Care Management Note (Signed)
    Page 1 of 1   12/27/2012     10:47:46 AM   CARE MANAGEMENT NOTE 12/27/2012  Patient:  Sean Maldonado, Sean Maldonado   Account Number:  [id number]  Date Initiated:  12/27/2012  Documentation initiated by:  Lorenda Ishihara  Subjective/Objective Assessment:   57 yo male admitted with abd pain, likely related to GB. PTA lived at home alone.     Action/Plan:   For surg tomorrow, home when stable   Anticipated DC Date:  12/27/2012   Anticipated DC Plan:  HOME/SELF CARE      DC Planning Services  CM consult      Choice offered to / List presented to:             Status of service:  Completed, signed off Medicare Important Message given?   (If response is "NO", the following Medicare IM given date fields will be blank) Date Medicare IM given:   Date Additional Medicare IM given:    Discharge Disposition:  HOME/SELF CARE  Per UR Regulation:  Reviewed for med. necessity/level of care/duration of stay  If discussed at Long Length of Stay Meetings, dates discussed:    Comments:

## 2012-12-27 NOTE — ED Provider Notes (Signed)
Medical screening examination/treatment/procedure(s) were performed by non-physician practitioner and as supervising physician I was immediately available for consultation/collaboration.  [place], DO 12/27/12 1449

## 2012-12-27 NOTE — H&P (Signed)
Reason for Consult:cholelithiasis Referring Physician:  LARKIN MORELOS is an 57 y.o. male.  HPI: this patient is known to our practice for prior axillary lymph node biopsy by Dr. Dwain Sarna in April of this year. He also had a gallbladder attack in 2013 and was hospitalized with abdominal pain and nausea and found to have cholelithiasis on CT scan at that time. His symptoms resolved and since then he has had 2 or 3 smaller episodes of abdominal pain and he discussed cholecystectomy with Dr. Dwain Sarna but they decided to not have surgery at that time. He returns today with right upper quadrant pain which began last night after eating a few hotdogs. He says that he has right sided and right upper quadrant abdominal pain which he describes as a "dull" pain which is similar to his prior episodes but this is more severe in in his initial episode. He attempted to take some Percocet but this has not improved his symptoms and he came to the emergency room for evaluation. He denies any fevers or chills or nausea and vomiting with this episode of. He did have upper and lower endoscopy in 2013. He denies any other symptoms. He did in the emergency room he has received several doses of IV pain medication and has persistent symptoms.  Past Medical History  Diagnosis Date  . Hypertension   . Hypercholesteremia   . Arthritis     pt denies  . Seasonal allergies   . Cholelithiasis   . Hepatic steatosis   . Asthma   . Obesity   . GERD (gastroesophageal reflux disease)   . Dysrhythmia     RBBB-incomplete    Past Surgical History  Procedure Laterality Date  . Tonsillectomy    . Wisdom tooth extraction    . Axillary lymph node biopsy Right 08/27/2012    Procedure: AXILLARY LYMPH NODE BIOPSY ;  Surgeon: Emelia Loron, MD;  Location: [facility];  Service: General;  Laterality: Right;    Family History  Problem Relation Age of Onset  . Diabetes Mother   . Heart disease Mother   . Colon polyps Neg Hx   .  Colon cancer Neg Hx     Social History:  reports that he has never smoked. He has never used smokeless tobacco. He reports that  drinks alcohol. He reports that he does not use illicit drugs.  Allergies: No Known Allergies  Medications: I have reviewed the patient's current medications.  Results for orders placed during the hospital encounter of 12/26/12 (from the past 48 hour(s))  URINALYSIS, ROUTINE W REFLEX MICROSCOPIC     Status: None   Collection Time    12/26/12  8:15 PM      Result Value Range   Color, Urine YELLOW  YELLOW   APPearance CLEAR  CLEAR   Specific Gravity, Urine 1.021  1.005 - 1.030   pH 5.5  5.0 - 8.0   Glucose, UA NEGATIVE  NEGATIVE mg/dL   Hgb urine dipstick NEGATIVE  NEGATIVE   Bilirubin Urine NEGATIVE  NEGATIVE   Ketones, ur NEGATIVE  NEGATIVE mg/dL   Protein, ur NEGATIVE  NEGATIVE mg/dL   Urobilinogen, UA 0.2  0.0 - 1.0 mg/dL   Nitrite NEGATIVE  NEGATIVE   Leukocytes, UA NEGATIVE  NEGATIVE   Comment: MICROSCOPIC NOT DONE ON URINES WITH NEGATIVE PROTEIN, BLOOD, LEUKOCYTES, NITRITE, OR GLUCOSE <1000 mg/dL.  CBC WITH DIFFERENTIAL     Status: None   Collection Time    12/26/12  8:25 PM  Result Value Range   WBC 8.5  4.0 - 10.5 K/uL   RBC 4.43  4.22 - 5.81 MIL/uL   Hemoglobin 13.8  13.0 - 17.0 g/dL   HCT 57.8  46.9 - 62.9 %   MCV 90.1  78.0 - 100.0 fL   MCH 31.2  26.0 - 34.0 pg   MCHC 34.6  30.0 - 36.0 g/dL   RDW 52.8  41.3 - 24.4 %   Platelets 204  150 - 400 K/uL   Neutrophils Relative % 69  43 - 77 %   Neutro Abs 5.9  1.7 - 7.7 K/uL   Lymphocytes Relative 23  12 - 46 %   Lymphs Abs 1.9  0.7 - 4.0 K/uL   Monocytes Relative 7  3 - 12 %   Monocytes Absolute 0.6  0.1 - 1.0 K/uL   Eosinophils Relative 1  0 - 5 %   Eosinophils Absolute 0.1  0.0 - 0.7 K/uL   Basophils Relative 0  0 - 1 %   Basophils Absolute 0.0  0.0 - 0.1 K/uL  COMPREHENSIVE METABOLIC PANEL     Status: Abnormal   Collection Time    12/26/12  8:25 PM      Result Value Range    Sodium 133 (*) 135 - 145 mEq/L   Potassium 3.4 (*) 3.5 - 5.1 mEq/L   Chloride 97  96 - 112 mEq/L   CO2 23  19 - 32 mEq/L   Glucose, Bld 131 (*) 70 - 99 mg/dL   BUN 10  6 - 23 mg/dL   Creatinine, Ser 0.10  0.50 - 1.35 mg/dL   Calcium 8.8  8.4 - 27.2 mg/dL   Total Protein 7.1  6.0 - 8.3 g/dL   Albumin 3.6  3.5 - 5.2 g/dL   AST 73 (*) 0 - 37 U/L   ALT 80 (*) 0 - 53 U/L   Alkaline Phosphatase 51  39 - 117 U/L   Total Bilirubin 0.5  0.3 - 1.2 mg/dL   GFR calc non Af Amer >90  >90 mL/min   GFR calc Af Amer >90  >90 mL/min   Comment: (NOTE)     The eGFR has been calculated using the CKD EPI equation.     This calculation has not been validated in all clinical situations.     eGFR's persistently <90 mL/min signify possible Chronic Kidney     Disease.  LIPASE, BLOOD     Status: None   Collection Time    12/26/12  8:25 PM      Result Value Range   Lipase 47  11 - 59 U/L    US Abdomen Complete  12/26/2012   *RADIOLOGY REPORT*  Clinical Data:  Abdominal pain.  History of gallstones.  COMPLETE ABDOMINAL ULTRASOUND  Comparison:  Ultrasound dated 06/29/2011  Findings:  Gallbladder:  There are numerous gallstones.  There is no gallbladder wall thickening.  Negative sonographic Murphy's sign.  Common bile duct:  Normal.  4.6 mm in diameter.  Liver:  Slight increased echogenicity of the liver parenchyma consistent with hepatic steatosis, without focal lesions.  IVC:  Normal.  Pancreas:  The head and body of the pancreas are normal.  The tail is obscured by bowel gas.  Spleen:  Normal.  9.0 cm in length.  Right Kidney:  Normal.  10.5 cm in length.  Left Kidney:  Normal.  11.2 cm in length.  Abdominal aorta:  Normal.  2.4 cm maximal diameter.  IMPRESSION: Numerous  gallstones.  Hepatic steatosis.   Original Report Authenticated By: Francene Boyers, M.D.    All other review of systems negative or noncontributory except as stated in the HPI   Blood pressure 154/94, pulse 68, temperature 98.3 F (36.8 C),  temperature source Oral, resp. rate 20, height 6' (1.829 m), weight 250 lb (113.399 kg), SpO2 93.00%. General appearance: alert, cooperative and no distress Head: Normocephalic, without obvious abnormality, atraumatic Neck: no JVD and supple, symmetrical, trachea midline Resp: clear to auscultation bilaterally Cardio: normal rate, regular GI: soft, mild RUQ tenderness, ND, no murphys or peritoneal signs Extremities: extremities normal, atraumatic, no cyanosis or edema Pulses: 2+ and symmetric Neurologic: Grossly normal  Assessment/Plan: Symptomatic cholelithiasis This is most likely symptomatic cholelithiasis or possible acute cholecystitis. He has no evidence of cholecystitis on ultrasound and his white count is normal however given the duration of his symptoms, and persistence of his pain, there is a possibility that this is representative of acute cholecystitis. We are unable to get pain control with IV pain medications so I have offered him admission with the plan of eventual cholecystectomy. I discussed with him the procedure and the risks and the alternatives. The risks of infection, bleeding, pain, persistent symptoms, scarring, injury to bowel or bile ducts, retained stone, diarrhea, need for additional procedures, and need for open surgery discussed with the patient.  He would like to proceed with cholecystectomy.   Lodema Pilot DAVID 12/27/2012, 1:02 AM

## 2012-12-27 NOTE — Anesthesia Preprocedure Evaluation (Addendum)
Anesthesia Evaluation  Patient identified by MRN, date of birth, ID band Patient awake    Reviewed: Allergy & Precautions, H&P , NPO status , Patient's Chart, lab work & pertinent test results, reviewed documented beta blocker date and time   Airway Mallampati: III TM Distance: >3 FB Neck ROM: full    Dental no notable dental hx. (+) Teeth Intact and Dental Advisory Given   Pulmonary asthma ,  Mild asthma breath sounds clear to auscultation  Pulmonary exam normal       Cardiovascular Exercise Tolerance: Good hypertension, Pt. on home beta blockers and Pt. on medications Rhythm:regular Rate:Normal  Incomplete RBBB   Neuro/Psych negative neurological ROS  negative psych ROS   GI/Hepatic negative GI ROS, Neg liver ROS, GERD-  Medicated and Controlled,(+) Hepatitis -Non alcoholic steatohepatitis   Endo/Other  negative endocrine ROS  Renal/GU negative Renal ROS  negative genitourinary   Musculoskeletal   Abdominal   Peds  Hematology negative hematology ROS (+)   Anesthesia Other Findings   Reproductive/Obstetrics negative OB ROS                          Anesthesia Physical Anesthesia Plan  ASA: III  Anesthesia Plan: General   Post-op Pain Management:    Induction: Intravenous  Airway Management Planned: Oral ETT  Additional Equipment:   Intra-op Plan:   Post-operative Plan: Extubation in OR  Informed Consent: I have reviewed the patients History and Physical, chart, labs and discussed the procedure including the risks, benefits and alternatives for the proposed anesthesia with the patient or authorized representative who has indicated his/her understanding and acceptance.   Dental Advisory Given  Plan Discussed with: CRNA and Surgeon  Anesthesia Plan Comments:         Anesthesia Quick Evaluation

## 2012-12-27 NOTE — Progress Notes (Signed)
  Subjective: Patient has some dull pain in RUQ. Pain/nausea is controlled with IV medicine. Denies N/V. Has no other problems. Denies fever, chills. Talked about having surgery tomorrow and patient acknowledges and understands.  Objective: Vital signs in last 24 hours: Temp:  [97.5 F (36.4 C)-98.3 F (36.8 C)] 98.2 F (36.8 C) (08/21 0549) Pulse Rate:  [64-77] 77 (08/21 0549) Resp:  [18-20] 20 (08/21 0549) BP: (131-156)/(84-95) 131/84 mmHg (08/21 0549) SpO2:  [93 %-99 %] 99 % (08/21 0549) Weight:  [113.399 kg (250 lb)] 113.399 kg (250 lb) (08/20 1947)    Intake/Output from previous day: 08/20 0701 - 08/21 0700 In: 304.2 [I.V.:304.2] Out: -  Intake/Output this shift:    PE: Gen:  Alert, NAD, pleasant Card:  RRR, no M/G/R heard Abd: Soft, NT/ND, +BS, no HSM Ext:  No erythema, edema  Lab Results:   Recent Labs  12/26/12 2025  WBC 8.5  HGB 13.8  HCT 39.9  PLT 204   BMET  Recent Labs  12/26/12 2025  NA 133*  K 3.4*  CL 97  CO2 23  GLUCOSE 131*  BUN 10  CREATININE 0.80  CALCIUM 8.8   PT/INR No results found for this basename: LABPROT, INR,  in the last 72 hours CMP     Component Value Date/Time   NA 133* 12/26/2012 2025   K 3.4* 12/26/2012 2025   CL 97 12/26/2012 2025   CO2 23 12/26/2012 2025   GLUCOSE 131* 12/26/2012 2025   BUN 10 12/26/2012 2025   CREATININE 0.80 12/26/2012 2025   CALCIUM 8.8 12/26/2012 2025   PROT 7.1 12/26/2012 2025   ALBUMIN 3.6 12/26/2012 2025   AST 73* 12/26/2012 2025   ALT 80* 12/26/2012 2025   ALKPHOS 51 12/26/2012 2025   BILITOT 0.5 12/26/2012 2025   GFRNONAA >90 12/26/2012 2025   GFRAA >90 12/26/2012 2025   Lipase     Component Value Date/Time   LIPASE 47 12/26/2012 2025       Studies/Results: US Abdomen Complete  12/26/2012   *RADIOLOGY REPORT*  Clinical Data:  Abdominal pain.  History of gallstones.  COMPLETE ABDOMINAL ULTRASOUND  Comparison:  Ultrasound dated 06/29/2011  Findings:  Gallbladder:  There are numerous  gallstones.  There is no gallbladder wall thickening.  Negative sonographic Murphy's sign.  Common bile duct:  Normal.  4.6 mm in diameter.  Liver:  Slight increased echogenicity of the liver parenchyma consistent with hepatic steatosis, without focal lesions.  IVC:  Normal.  Pancreas:  The head and body of the pancreas are normal.  The tail is obscured by bowel gas.  Spleen:  Normal.  9.0 cm in length.  Right Kidney:  Normal.  10.5 cm in length.  Left Kidney:  Normal.  11.2 cm in length.  Abdominal aorta:  Normal.  2.4 cm maximal diameter.  IMPRESSION: Numerous gallstones.  Hepatic steatosis.   Original Report Authenticated By: Francene Boyers, M.D.    Anti-infectives: Anti-infectives   None       Assessment/Plan Symptomatic Cholelithiasis RUQ Abdominal Pain Nausea Hypokalemia (mild)  Plan: Laparoscopic Cholecystectomy: surgery scheduled: 0730 12/28/12 1. Start Lovenox today, and hold dose tomorrow. 2. Clear liquid diet until midnight. NPO at midnight 12/28/12 3. SCDs, IS, ambulation as tolerated 4. IV pain medicine, antiemetics. 5. Continue fluids with K. Recheck K tomorrow 12/28/12   LOS: 1 day    Georgina Quint, PA-S [facility] Physician Assistant Student 12/27/2012, 7:47 AM [facility] [phone]

## 2012-12-27 NOTE — Progress Notes (Signed)
Pt seen and examined.  No signs of acute cholecystitis.  Has eaten. Will schedule for am  Lap chole IOC.  The procedure has been discussed with the patient. Operative and non operative treatments have been discussed. Risks of surgery include bleeding, infection,  Common bile duct injury,  Injury to the stomach,liver, colon,small intestine, abdominal wall,  Diaphragm,  Major blood vessels,  And the need for an open procedure.  Other risks include worsening of medical problems, death,  DVT and pulmonary embolism, and cardiovascular events.   Medical options have also been discussed. The patient has been informed of long term expectations of surgery and non surgical options,  The patient agrees to proceed.

## 2012-12-28 ENCOUNTER — Encounter (HOSPITAL_COMMUNITY)
Admission: EM | Disposition: A | Discharge status: Home / Self Care | Payer: Self-pay | Source: Home / Self Care | Attending: Emergency Medicine

## 2012-12-28 ENCOUNTER — Observation Stay (HOSPITAL_COMMUNITY): Payer: BC Managed Care – PPO | Admitting: Anesthesiology

## 2012-12-28 ENCOUNTER — Observation Stay (HOSPITAL_COMMUNITY): Payer: BC Managed Care – PPO

## 2012-12-28 ENCOUNTER — Encounter (HOSPITAL_COMMUNITY): Payer: Self-pay | Admitting: Anesthesiology

## 2012-12-28 DIAGNOSIS — K8010 Calculus of gallbladder with chronic cholecystitis without obstruction: Secondary | ICD-10-CM

## 2012-12-28 HISTORY — PX: CHOLECYSTECTOMY: SHX55

## 2012-12-28 SURGERY — LAPAROSCOPIC CHOLECYSTECTOMY WITH INTRAOPERATIVE CHOLANGIOGRAM
Anesthesia: General | Site: Abdomen | Wound class: Dirty or Infected

## 2012-12-28 MED ORDER — IOHEXOL 300 MG/ML  SOLN
INTRAMUSCULAR | Status: DC | PRN
  Administered 2012-12-28: 3 mL

## 2012-12-28 MED ORDER — LIDOCAINE HCL (CARDIAC) 20 MG/ML IV SOLN
INTRAVENOUS | Status: DC | PRN
  Administered 2012-12-28: 100 mg via INTRAVENOUS

## 2012-12-28 MED ORDER — HYDROMORPHONE HCL PF 1 MG/ML IJ SOLN
0.2500 mg | INTRAMUSCULAR | Status: DC | PRN
  Administered 2012-12-28 (×4): 0.5 mg via INTRAVENOUS

## 2012-12-28 MED ORDER — NEOSTIGMINE METHYLSULFATE 1 MG/ML IJ SOLN
INTRAMUSCULAR | Status: DC | PRN
  Administered 2012-12-28: 5 mg via INTRAVENOUS

## 2012-12-28 MED ORDER — SUCCINYLCHOLINE CHLORIDE 20 MG/ML IJ SOLN
INTRAMUSCULAR | Status: DC | PRN
  Administered 2012-12-28: 100 mg via INTRAVENOUS

## 2012-12-28 MED ORDER — LACTATED RINGERS IV SOLN
INTRAVENOUS | Status: DC | PRN
  Administered 2012-12-28 (×2): via INTRAVENOUS

## 2012-12-28 MED ORDER — OXYCODONE-ACETAMINOPHEN 5-325 MG PO TABS
1.0000 | ORAL_TABLET | ORAL | Status: DC | PRN
  Administered 2012-12-28 – 2012-12-29 (×2): 1 via ORAL
  Filled 2012-12-28 (×2): qty 1

## 2012-12-28 MED ORDER — MIDAZOLAM HCL 5 MG/5ML IJ SOLN
INTRAMUSCULAR | Status: DC | PRN
  Administered 2012-12-28: 2 mg via INTRAVENOUS

## 2012-12-28 MED ORDER — EPHEDRINE SULFATE 50 MG/ML IJ SOLN
INTRAMUSCULAR | Status: DC | PRN
  Administered 2012-12-28: 10 mg via INTRAVENOUS
  Administered 2012-12-28: 15 mg via INTRAVENOUS

## 2012-12-28 MED ORDER — DEXAMETHASONE SODIUM PHOSPHATE 10 MG/ML IJ SOLN
INTRAMUSCULAR | Status: DC | PRN
Start: 2012-12-28 — End: 2012-12-28
  Administered 2012-12-28: 10 mg via INTRAVENOUS

## 2012-12-28 MED ORDER — ROCURONIUM BROMIDE 100 MG/10ML IV SOLN
INTRAVENOUS | Status: DC | PRN
  Administered 2012-12-28: 10 mg via INTRAVENOUS
  Administered 2012-12-28: 40 mg via INTRAVENOUS

## 2012-12-28 MED ORDER — LACTATED RINGERS IV SOLN: INTRAVENOUS | Status: DC

## 2012-12-28 MED ORDER — KCL IN DEXTROSE-NACL 20-5-0.45 MEQ/L-%-% IV SOLN
INTRAVENOUS | Status: DC
  Administered 2012-12-28: 20:00:00 via INTRAVENOUS
  Filled 2012-12-28 (×2): qty 1000

## 2012-12-28 MED ORDER — LACTATED RINGERS IR SOLN
Status: DC | PRN
  Administered 2012-12-28 (×2): 1000 mL

## 2012-12-28 MED ORDER — BUPIVACAINE-EPINEPHRINE 0.25% -1:200000 IJ SOLN
INTRAMUSCULAR | Status: DC | PRN
  Administered 2012-12-28: 8 mL

## 2012-12-28 MED ORDER — GLYCOPYRROLATE 0.2 MG/ML IJ SOLN
INTRAMUSCULAR | Status: DC | PRN
  Administered 2012-12-28: 0.6 mg via INTRAVENOUS

## 2012-12-28 MED ORDER — PROPOFOL 10 MG/ML IV BOLUS
INTRAVENOUS | Status: DC | PRN
  Administered 2012-12-28: 200 mg via INTRAVENOUS

## 2012-12-28 MED ORDER — PHENYLEPHRINE HCL 10 MG/ML IJ SOLN
INTRAMUSCULAR | Status: DC | PRN
  Administered 2012-12-28 (×2): 80 ug via INTRAVENOUS

## 2012-12-28 MED ORDER — 0.9 % SODIUM CHLORIDE (POUR BTL) OPTIME
TOPICAL | Status: DC | PRN
  Administered 2012-12-28: 1000 mL

## 2012-12-28 MED ORDER — FENTANYL CITRATE 0.05 MG/ML IJ SOLN
INTRAMUSCULAR | Status: DC | PRN
  Administered 2012-12-28 (×3): 50 ug via INTRAVENOUS

## 2012-12-28 MED ORDER — ONDANSETRON HCL 4 MG/2ML IJ SOLN
INTRAMUSCULAR | Status: DC | PRN
  Administered 2012-12-28: 4 mg via INTRAVENOUS

## 2012-12-28 SURGICAL SUPPLY — 34 items
APPLIER CLIP ROT 10 11.4 M/L (STAPLE) ×2
CANISTER SUCTION 2500CC (MISCELLANEOUS) ×2 IMPLANT
CHLORAPREP W/TINT 26ML (MISCELLANEOUS) ×2 IMPLANT
CLIP APPLIE ROT 10 11.4 M/L (STAPLE) ×1 IMPLANT
CLOTH BEACON ORANGE TIMEOUT ST (SAFETY) ×2 IMPLANT
COVER MAYO STAND STRL (DRAPES) ×2 IMPLANT
DECANTER SPIKE VIAL GLASS SM (MISCELLANEOUS) ×2 IMPLANT
DERMABOND ADVANCED (GAUZE/BANDAGES/DRESSINGS)
DERMABOND ADVANCED .7 DNX12 (GAUZE/BANDAGES/DRESSINGS) IMPLANT
DRAPE C-ARM 42X120 X-RAY (DRAPES) ×2 IMPLANT
DRAPE LAPAROSCOPIC ABDOMINAL (DRAPES) ×2 IMPLANT
DRAPE UTILITY XL STRL (DRAPES) ×2 IMPLANT
DRAPE WARM FLUID 44X44 (DRAPE) ×2 IMPLANT
ELECT REM PT RETURN 9FT ADLT (ELECTROSURGICAL) ×2
ELECTRODE REM PT RTRN 9FT ADLT (ELECTROSURGICAL) ×1 IMPLANT
GLOVE INDICATOR 8.0 STRL GRN (GLOVE) ×2 IMPLANT
GLOVE SS BIOGEL STRL SZ 8 (GLOVE) ×1 IMPLANT
GLOVE SUPERSENSE BIOGEL SZ 8 (GLOVE) ×1
GOWN STRL REIN XL XLG (GOWN DISPOSABLE) ×4 IMPLANT
HEMOSTAT SURGICEL 4X8 (HEMOSTASIS) ×2 IMPLANT
KIT BASIN OR (CUSTOM PROCEDURE TRAY) ×2 IMPLANT
POUCH SPECIMEN RETRIEVAL 10MM (ENDOMECHANICALS) ×2 IMPLANT
SCISSORS LAP 5X35 DISP (ENDOMECHANICALS) IMPLANT
SET CHOLANGIOGRAPH MIX (MISCELLANEOUS) ×2 IMPLANT
SET IRRIG TUBING LAPAROSCOPIC (IRRIGATION / IRRIGATOR) ×2 IMPLANT
SLEEVE XCEL OPT CAN 5 100 (ENDOMECHANICALS) ×2 IMPLANT
SUT MNCRL AB 4-0 PS2 18 (SUTURE) ×2 IMPLANT
TOWEL OR 17X26 10 PK STRL BLUE (TOWEL DISPOSABLE) ×2 IMPLANT
TOWEL OR NON WOVEN STRL DISP B (DISPOSABLE) ×2 IMPLANT
TRAY LAP CHOLE (CUSTOM PROCEDURE TRAY) ×2 IMPLANT
TROCAR BLADELESS OPT 5 100 (ENDOMECHANICALS) ×2 IMPLANT
TROCAR XCEL BLUNT TIP 100MML (ENDOMECHANICALS) ×2 IMPLANT
TROCAR XCEL NON-BLD 11X100MML (ENDOMECHANICALS) ×2 IMPLANT
TUBING INSUFFLATION 10FT LAP (TUBING) ×2 IMPLANT

## 2012-12-28 NOTE — Anesthesia Postprocedure Evaluation (Signed)
  Anesthesia Post-op Note  Patient: Sean Maldonado  Procedure(s) Performed: Procedure(s) (LRB): LAPAROSCOPIC CHOLECYSTECTOMY WITH INTRAOPERATIVE CHOLANGIOGRAM (N/A)  Patient Location: PACU  Anesthesia Type: General  Level of Consciousness: awake and alert   Airway and Oxygen Therapy: Patient Spontanous Breathing  Post-op Pain: mild  Post-op Assessment: Post-op Vital signs reviewed, Patient's Cardiovascular Status Stable, Respiratory Function Stable, Patent Airway and No signs of Nausea or vomiting  Last Vitals:  Filed Vitals:   12/28/12 0952  BP: 126/82  Pulse: 72  Temp: 37.2 C  Resp: 20    Post-op Vital Signs: stable   Complications: No apparent anesthesia complications

## 2012-12-28 NOTE — Transfer of Care (Signed)
Immediate Anesthesia Transfer of Care Note  Patient: Sean Maldonado  Procedure(s) Performed: Procedure(s): LAPAROSCOPIC CHOLECYSTECTOMY WITH INTRAOPERATIVE CHOLANGIOGRAM (N/A)  Patient Location: PACU  Anesthesia Type:General  Level of Consciousness: sedated  Airway & Oxygen Therapy: Patient Spontanous Breathing and Patient connected to face mask oxygen  Post-op Assessment: Report given to PACU RN and Post -op Vital signs reviewed and stable  Post vital signs: Reviewed and stable  Complications: No apparent anesthesia complications

## 2012-12-28 NOTE — Op Note (Signed)
Laparoscopic Cholecystectomy with IOC Procedure Note  Indications: This patient presents with symptomatic gallbladder disease and will undergo laparoscopic cholecystectomy.The procedure has been discussed with the patient. Operative and non operative treatments have been discussed. Risks of surgery include bleeding, infection,  Common bile duct injury,  Injury to the stomach,liver, colon,small intestine, abdominal wall,  Diaphragm,  Major blood vessels,  And the need for an open procedure.  Other risks include worsening of medical problems, death,  DVT and pulmonary embolism, and cardiovascular events.   Medical options have also been discussed. The patient has been informed of long term expectations of surgery and non surgical options,  The patient agrees to proceed.    Pre-operative Diagnosis: Acute cholecystitis  Post-operative Diagnosis: Same  Surgeon: Charizma Gardiner A.   Assistants: OR staff  Anesthesia: General endotracheal anesthesia and Local anesthesia 0.25.% bupivacaine, with epinephrine  ASA Class: 2  Procedure Details  The patient was seen again in the Holding Room. The risks, benefits, complications, treatment options, and expected outcomes were discussed with the patient. The possibilities of reaction to medication, pulmonary aspiration, perforation of viscus, bleeding, recurrent infection, finding a normal gallbladder, the need for additional procedures, failure to diagnose a condition, the possible need to convert to an open procedure, and creating a complication requiring transfusion or operation were discussed with the patient. The patient and/or family concurred with the proposed plan, giving informed consent. The site of surgery properly noted/marked. The patient was taken to Operating Room, identified as [facility] and the procedure verified as Laparoscopic Cholecystectomy with Intraoperative Cholangiograms. A Time Out was held and the above information confirmed.  Prior to  the induction of general anesthesia, antibiotic prophylaxis was administered. General endotracheal anesthesia was then administered and tolerated well. After the induction, the abdomen was prepped in the usual sterile fashion. The patient was positioned in the supine position with the left arm comfortably tucked, along with some reverse Trendelenburg.  Local anesthetic agent was injected into the skin near the umbilicus and an incision made. The midline fascia was incised and the Hasson technique was used to introduce a 12 mm port under direct vision. It was secured with a figure of eight Vicryl suture placed in the usual fashion. Pneumoperitoneum was then created with CO2 and tolerated well without any adverse changes in the patient's vital signs. Additional trocars were introduced under direct vision with an 11 mm trocar in the epigastrium and two  5 mm trocars in the right upper quadrant. All skin incisions were infiltrated with a local anesthetic agent before making the incision and placing the trocars.   The gallbladder was identified, the fundus grasped and retracted cephalad. Adhesions were lysed bluntly and with the electrocautery where indicated, taking care not to injure any adjacent organs or viscus. The infundibulum was grasped and retracted laterally, exposing the peritoneum overlying the triangle of Calot. This was then divided and exposed in a blunt fashion. The cystic duct was clearly identified and bluntly dissected circumferentially. The junctions of the gallbladder, cystic duct and common bile duct were clearly identified prior to the division of any linear structure.   An incision was made in the cystic duct and the cholangiogram catheter introduced. The catheter was secured using an endoclip. The study showed no stones and good visualization of the distal and proximal biliary tree. The catheter was then removed.   The cystic duct was then  ligated with surgical clips  on the patient side  and  clipped on the gallbladder side  and divided. The cystic artery was identified, dissected free, ligated with clips and divided as well. Posterior cystic artery clipped and divided.  The gallbladder was dissected from the liver bed in retrograde fashion with the electrocautery. The gallbladder was removed. The liver bed was irrigated and inspected. Hemostasis was achieved with the electrocautery. Copious irrigation was utilized and was repeatedly aspirated until clear all particulate matter. Hemostasis was achieved with no signs  Of bleeding or bile leakage.  Pneumoperitoneum was completely reduced after viewing removal of the trocars under direct vision. The wound was thoroughly irrigated and the fascia was then closed with a figure of eight suture; the skin was then closed with 4 O monocryl  and a sterile dressing was applied.  Instrument, sponge, and needle counts were correct at closure and at the conclusion of the case.   Findings: Cholecystitis with Cholelithiasis  Estimated Blood Loss: less than 50 mL                 Total IV Fluids: 600 mL         Specimens: Gallbladder           Complications: None; patient tolerated the procedure well.         Disposition: PACU - hemodynamically stable.         Condition: stable

## 2012-12-28 NOTE — Interval H&P Note (Signed)
History and Physical Interval Note:  12/28/2012 7:04 AM  [name]  has presented today for surgery, with the diagnosis of cholelathiasis  The various methods of treatment have been discussed with the patient and family. After consideration of risks, benefits and other options for treatment, the patient has consented to  Procedure(s): LAPAROSCOPIC CHOLECYSTECTOMY WITH INTRAOPERATIVE CHOLANGIOGRAM (N/A) as a surgical intervention .  The patient's history has been reviewed, patient examined, no change in status, stable for surgery.  I have reviewed the patient's chart and labs.  Questions were answered to the patient's satisfaction.     Sean Maldonado A.

## 2012-12-29 MED ORDER — OXYCODONE-ACETAMINOPHEN 5-325 MG PO TABS: 1.0000 | ORAL_TABLET | ORAL | Status: DC | PRN

## 2012-12-29 MED ORDER — CIPROFLOXACIN HCL 500 MG PO TABS: 500.0000 mg | ORAL_TABLET | Freq: Two times a day (BID) | ORAL | Status: DC

## 2012-12-29 MED ORDER — CIPROFLOXACIN HCL 500 MG PO TABS
500.0000 mg | ORAL_TABLET | Freq: Two times a day (BID) | ORAL | Status: DC
  Filled 2012-12-29 (×3): qty 1

## 2012-12-29 NOTE — Progress Notes (Signed)
1 Day Post-Op  Subjective: Feels better  Objective: Vital signs in last 24 hours: Temp:  [97.6 F (36.4 C)-98.9 F (37.2 C)] 97.9 F (36.6 C) (08/23 0600) Pulse Rate:  [66-94] 94 (08/23 0600) Resp:  [14-22] 18 (08/23 0600) BP: (125-143)/(73-88) 142/88 mmHg (08/23 0600) SpO2:  [93 %-99 %] 95 % (08/23 0600) Last BM Date: 12/26/12  Intake/Output from previous day: 08/22 0701 - 08/23 0700 In: 2515 [I.V.:2515] Out: 3075 [Urine:3075] Intake/Output this shift:    Incision/Wound:clean dry intact sore no peritonitis.  Lab Results:   Recent Labs  12/26/12 2025  WBC 8.5  HGB 13.8  HCT 39.9  PLT 204   BMET  Recent Labs  12/26/12 2025  NA 133*  K 3.4*  CL 97  CO2 23  GLUCOSE 131*  BUN 10  CREATININE 0.80  CALCIUM 8.8   PT/INR No results found for this basename: LABPROT, INR,  in the last 72 hours ABG No results found for this basename: PHART, PCO2, PO2, HCO3,  in the last 72 hours  Studies/Results: Dg Cholangiogram Operative  12/28/2012   CLINICAL DATA:  Right upper quadrant pain.  EXAM: INTRAOPERATIVE CHOLANGIOGRAM  TECHNIQUE: Cholangiographic images from the C-arm fluoroscopic device were submitted for interpretation post-operatively. Please see the procedural report for the amount of contrast and the fluoroscopy time utilized.  COMPARISON:  Ultrasound 12/26/2012  FINDINGS: Intraoperative spot images show normal caliber biliarysystem. No evidence of retained stone orobstruction. Free passage of contrastinto the small bowel noted.  IMPRESSION: No evidence of retained stone or obstruction.   Electronically Signed   By: Rolm Baptise   On: 12/28/2012 08:33    Anti-infectives: Anti-infectives   Start     Dose/Rate Route Frequency Ordered Stop   12/29/12 0915  ciprofloxacin (CIPRO) tablet 500 mg     500 mg Oral 2 times daily 12/29/12 0914     12/28/12 0600  [MAR Hold]  Ampicillin-Sulbactam (UNASYN) 3 g in sodium chloride 0.9 % 100 mL IVPB     (On MAR Hold since 12/28/12  0706)  Comments:  On call to O.R.   3 g 100 mL/hr over 60 Minutes Intravenous On call to O.R. 12/27/12 0848 12/28/12 0725      Assessment/Plan: s/p Procedure(s): LAPAROSCOPIC CHOLECYSTECTOMY WITH INTRAOPERATIVE CHOLANGIOGRAM (N/A) Discharge  LOS: 3 days    Jameisha Stofko A. 12/29/2012

## 2012-12-29 NOTE — Discharge Instructions (Signed)
[facility] ______[facility] °LAPAROSCOPIC SURGERY: POST OP INSTRUCTIONS °Always review your discharge instruction sheet given to you by the facility where your surgery was performed. °IF YOU HAVE DISABILITY OR FAMILY LEAVE FORMS, YOU MUST BRING THEM TO THE OFFICE FOR PROCESSING.   °DO NOT GIVE THEM TO YOUR DOCTOR. ° °1. A prescription for pain medication may be given to you upon discharge.  Take your pain medication as prescribed, if needed.  If narcotic pain medicine is not needed, then you may take acetaminophen (Tylenol) or ibuprofen (Advil) as needed. °2. Take your usually prescribed medications unless otherwise directed. °3. If you need a refill on your pain medication, please contact your pharmacy.  They will contact our office to request authorization. Prescriptions will not be filled after 5pm or on week-ends. °4. You should follow a light diet the first few days after arrival home, such as soup and crackers, etc.  Be sure to include lots of fluids daily. °5. Most patients will experience some swelling and bruising in the area of the incisions.  Ice packs will help.  Swelling and bruising can take several days to resolve.  °6. It is common to experience some constipation if taking pain medication after surgery.  Increasing fluid intake and taking a stool softener (such as Colace) will usually help or prevent this problem from occurring.  A mild laxative (Milk of Magnesia or Miralax) should be taken according to package instructions if there are no bowel movements after 48 hours. °7. Unless discharge instructions indicate otherwise, you may remove your bandages 24-48 hours after surgery, and you may shower at that time.  You may have steri-strips (small skin tapes) in place directly over the incision.  These strips should be left on the skin for 7-10 days.  If your surgeon used skin glue on the incision, you may shower in 24 hours.  The glue will flake off over the next 2-3 weeks.  Any sutures or  staples will be removed at the office during your follow-up visit. °8. ACTIVITIES:  You may resume regular (light) daily activities beginning the next day--such as daily self-care, walking, climbing stairs--gradually increasing activities as tolerated.  You may have sexual intercourse when it is comfortable.  Refrain from any heavy lifting or straining until approved by your doctor. °a. You may drive when you are no longer taking prescription pain medication, you can comfortably wear a seatbelt, and you can safely maneuver your car and apply brakes. °b. RETURN TO WORK:  __________________________________________________________ °9. You should see your doctor in the office for a follow-up appointment approximately 2-3 weeks after your surgery.  Make sure that you call for this appointment within a day or two after you arrive home to insure a convenient appointment time. °10. OTHER INSTRUCTIONS: __________________________________________________________________________________________________________________________ __________________________________________________________________________________________________________________________ °WHEN TO CALL YOUR DOCTOR: °1. Fever over 101.0 °2. Inability to urinate °3. Continued bleeding from incision. °4. Increased pain, redness, or drainage from the incision. °5. Increasing abdominal pain ° °The clinic staff is available to answer your questions during regular business hours.  Please don’t hesitate to call and ask to speak to one of the nurses for clinical concerns.  If you have a medical emergency, go to the nearest emergency room or call 911.  A surgeon from [facility] is always on call at the hospital. °[address], [address], [place], [place]  [address] ? [address] [address], [place], [place]   [address] °[phone] ? [phone] ? FAX [phone] °Web site:   [email or web address] °

## 2012-12-29 NOTE — Discharge Summary (Signed)
Physician Discharge Summary  Patient ID: Sean Maldonado MRN: [id number] DOB/AGE: 56-Jul-1957 56 y.o.  Admit date: 12/26/2012 Discharge date: 12/29/2012  Admission Diagnoses:acute cholecystitis Patient Active Problem List   Diagnosis Date Noted  . Melena 08/01/2011  . Special screening for malignant neoplasms, colon 08/01/2011  . Gastritis, acute with hemorrhage 08/01/2011  . GERD (gastroesophageal reflux disease) 07/28/2011  . Abdominal  pain, other specified site 07/28/2011  . Gallstones 07/28/2011  . NASH (nonalcoholic steatohepatitis) 07/28/2011  . Abdominal pain 06/29/2011  . Cholelithiasis 06/29/2011    Discharge Diagnoses: same Active Problems:   * No active hospital problems. *   Discharged Condition: good  Hospital Course: Pt did well after lap chole.  See op note.  Tolerated diet and has good pain control.  Wounds clean.    Consults: None  Significant Diagnostic Studies: labs: see record  Treatments: surgery: lap chole IOC  Discharge Exam: Blood pressure 142/88, pulse 94, temperature 97.9 F (36.6 C), temperature source Oral, resp. rate 18, height 6' (1.829 m), weight 250 lb (113.399 kg), SpO2 95.00%. Incision/Wound:CLEAN DRY INTACT SOFT APPROPRIATE TENDRENESS  Disposition: 01-Home or Self Care  Discharge Orders   Future Orders Complete By Expires   Diet - low sodium heart healthy  As directed    Driving Restrictions  As directed    Comments:     Drive on tuesday   Increase activity slowly  As directed    Lifting restrictions  As directed    Comments:     Lift what is comfortable for 2 weeks then no restrictions       Medication List    STOP taking these medications       oxyCODONE-acetaminophen 10-325 MG per tablet  Commonly known as:  PERCOCET  Replaced by:  oxyCODONE-acetaminophen 5-325 MG per tablet      TAKE these medications       aspirin 81 MG tablet  Take 81 mg by mouth daily.     atenolol 100 MG tablet  Commonly known as:  TENORMIN   Take 50 mg by mouth daily with breakfast.     ciprofloxacin 500 MG tablet  Commonly known as:  CIPRO  Take 1 tablet (500 mg total) by mouth 2 (two) times daily.     ezetimibe 10 MG tablet  Commonly known as:  ZETIA  Take 10 mg by mouth every morning.     fexofenadine 180 MG tablet  Commonly known as:  ALLEGRA  Take 180 mg by mouth daily as needed (allergies).     lisinopril-hydrochlorothiazide 20-12.5 MG per tablet  Commonly known as:  PRINZIDE,ZESTORETIC  Take 1 tablet by mouth daily with breakfast.     multivitamin capsule  Take 1 capsule by mouth daily.     omeprazole-sodium bicarbonate 40-1100 MG per capsule  Commonly known as:  ZEGERID  Take 1 capsule by mouth daily before breakfast.     oxyCODONE-acetaminophen 5-325 MG per tablet  Commonly known as:  PERCOCET/ROXICET  Take 1-2 tablets by mouth every 4 (four) hours as needed.     Vitamin D3 5000 UNITS Caps  Take 1 capsule by mouth daily.         Signed: Shraga Custard A. 12/29/2012, 9:18 AM

## 2012-12-29 NOTE — Progress Notes (Signed)
Utilization Review completed.  

## 2012-12-29 NOTE — Progress Notes (Signed)
Assessment unchanged. Pt verbalized understanding of dc instructions through teach back. Script x1 given as provided by MD. Pt understood My Chart. " I set my mom's account up."  Discharged via foot per request to front entrance to meet awaiting vehicle to carry home. Accompanied by friend and NT.

## 2012-12-31 ENCOUNTER — Encounter (HOSPITAL_COMMUNITY): Payer: Self-pay | Admitting: Surgery

## 2013-01-22 ENCOUNTER — Ambulatory Visit (INDEPENDENT_AMBULATORY_CARE_PROVIDER_SITE_OTHER): Payer: BC Managed Care – PPO | Admitting: General Surgery

## 2013-01-22 ENCOUNTER — Encounter (INDEPENDENT_AMBULATORY_CARE_PROVIDER_SITE_OTHER): Payer: Self-pay

## 2013-01-22 VITALS — BP 140/76 | HR 72 | Temp 98.1°F | Resp 14 | Ht 72.0 in | Wt 265.0 lb

## 2013-01-22 DIAGNOSIS — K811 Chronic cholecystitis: Secondary | ICD-10-CM

## 2013-01-22 DIAGNOSIS — Z9049 Acquired absence of other specified parts of digestive tract: Secondary | ICD-10-CM

## 2013-01-22 DIAGNOSIS — Z9889 Other specified postprocedural states: Secondary | ICD-10-CM

## 2013-01-22 NOTE — Patient Instructions (Signed)
You may resume to normal activities.  Follow up if needed.

## 2013-01-22 NOTE — Progress Notes (Signed)
Subjective: post op follow up     Patient ID: Sean Maldonado, male   DOB: May 16, 1955, 57 y.o.   MRN: [id number]  HPI Sean Maldonado presents today for a post operative check following a lap chole on 12/28/12.  He was able to return to activities in 3 days, no longer taking pain medication.  Denies n/v.  Certain high fat food causing diarrhea, avoiding and doing well.  Tolerating orals.  Denies f/c   Review of Systems  Constitutional: Negative for fever and chills.  Respiratory: Negative for shortness of breath and wheezing.   Cardiovascular: Negative for chest pain and palpitations.  Gastrointestinal: Negative for nausea, vomiting, abdominal pain, diarrhea and constipation.       Objective:   Physical Exam  Constitutional: He appears well-developed and well-nourished. No distress.  Cardiovascular: Normal rate, regular rhythm, normal heart sounds and intact distal pulses.  Exam reveals no gallop and no friction rub.   No murmur heard. Pulmonary/Chest: Breath sounds normal.  Abdominal: Soft. Bowel sounds are normal. He exhibits no distension and no mass.  Well healed incisions   Skin: He is not diaphoretic.   Filed Vitals:   01/22/13 1534  BP: 140/76  Pulse: 72  Temp: 98.1 F (36.7 C)  Resp: 14       Assessment:     Chronic cholecystitis Cholelithiasis S/p lap chole Dr. Luisa Hart 12/28/12    Plan:     Pathology reviewed.  May resume normal activities.  Follow up on PRN basis

## 2013-07-03 ENCOUNTER — Other Ambulatory Visit: Payer: Self-pay | Admitting: Internal Medicine

## 2013-07-17 DIAGNOSIS — E1169 Type 2 diabetes mellitus with other specified complication: Secondary | ICD-10-CM | POA: Insufficient documentation

## 2013-07-17 DIAGNOSIS — J302 Other seasonal allergic rhinitis: Secondary | ICD-10-CM | POA: Insufficient documentation

## 2013-07-17 DIAGNOSIS — E119 Type 2 diabetes mellitus without complications: Secondary | ICD-10-CM | POA: Insufficient documentation

## 2013-07-17 DIAGNOSIS — E782 Mixed hyperlipidemia: Secondary | ICD-10-CM

## 2013-07-17 DIAGNOSIS — J45909 Unspecified asthma, uncomplicated: Secondary | ICD-10-CM | POA: Insufficient documentation

## 2013-07-17 DIAGNOSIS — I10 Essential (primary) hypertension: Secondary | ICD-10-CM | POA: Insufficient documentation

## 2013-07-17 DIAGNOSIS — R7303 Prediabetes: Secondary | ICD-10-CM

## 2013-07-17 DIAGNOSIS — E559 Vitamin D deficiency, unspecified: Secondary | ICD-10-CM | POA: Insufficient documentation

## 2013-07-21 ENCOUNTER — Encounter: Payer: Self-pay | Admitting: Internal Medicine

## 2013-07-21 NOTE — Progress Notes (Signed)
Patient ID: Sean Maldonado, male   DOB: 1955/10/05, 58 y.o.   MRN: [id number]   Annual Screening Comprehensive Examination  This very nice 58 y.o.  SWM presents for complete physical.  Patient has been followed for HTN, Diabetes  Prediabetes, Hyperlipidemia, and Vitamin D Deficiency.   HTN predates since  2004 and treatment was started in 2006. Today's BP: 142/86 mmHg. Patient denies any cardiac symptoms as chest pain, palpitations, shortness of breath, dizziness or ankle swelling.   Patient's hyperlipidemia is controlled with diet and medications. Patient denies myalgias or other medication SE's. Last cholesterol last visit was171, triglycerides 227, HDL 25 and LDL 101 in Oct 2014 at goal.     Patient has Morbid Obesity with BMI 36 and  T2 NIDDM with A1c 6.6% and elevated insulin 174 in Oct 2014 and was Rx'd Metformin which he subsequently d/c'd due to GI SE's and diarrhea.  Patient denies reactive hypoglycemic symptoms, visual blurring, diabetic polys, or paresthesias.    Finally, patient has history of Vitamin D Deficiency of 42 in 2008 with last vitamin D 74 in Oct 2014.     Medication Sig  . aspirin 81 MG tablet Take 81 mg by mouth daily.  Marland Kitchen atenolol (TENORMIN) 100 MG tablet Take 50 mg by mouth daily with breakfast.   . Cholecalciferol (VITAMIN D3) 5000 UNITS CAPS Take 1 capsule by mouth daily.  . fexofenadine (ALLEGRA) 180 MG tablet Take 180 mg by mouth daily as needed (allergies).  Marland Kitchen lisinopril-hydrochlorothiazide (PRINZIDE,ZESTORETIC) 20-12.5 MG per tablet TAKE 1 TABLET DAILY FOR BLOOD PRESSURE.  . Multiple Vitamin (MULTIVITAMIN) capsule Take 1 capsule by mouth daily.  Marland Kitchen omeprazole-sodium bicarbonate (ZEGERID) 40-1100 MG per capsule Take 1 capsule by mouth daily before breakfast.  . ZETIA 10 MG tablet TAKE ONE TABLET DAILY FOR CHOLESTEROL.      Allergies  Allergen Reactions  . Lipitor [Atorvastatin]     Myalgias   . Zocor [Simvastatin]     Myalgias    Past Medical History   Diagnosis Date  . Seasonal allergies   . Cholelithiasis   . Hepatic steatosis   . Obesity   . GERD (gastroesophageal reflux disease)   . Dysrhythmia     RBBB-incomplete  . Arthritis     pt denies  . Asthma   . Hypercholesteremia   . Hypertension   . Prediabetes   . Vitamin D deficiency   . Fatty liver     Past Surgical History  Procedure Laterality Date  . Tonsillectomy    . Wisdom tooth extraction    . Axillary lymph node biopsy Right 08/27/2012    Procedure: AXILLARY LYMPH NODE BIOPSY ;  Surgeon: Rolm Bookbinder, MD;  Location: [facility];  Service: General;  Laterality: Right;  . Cholecystectomy N/A 12/28/2012    Procedure: LAPAROSCOPIC CHOLECYSTECTOMY WITH INTRAOPERATIVE CHOLANGIOGRAM;  Surgeon: Joyice Faster. Cornett, MD;  Location: [facility];  Service: General;  Laterality: N/A;    Family History  Problem Relation Age of Onset  . Diabetes Mother   . Heart disease Mother   . Colon polyps Neg Hx   . Colon cancer Neg Hx     History   Social History  . Marital Status: Single    Spouse Name: N/A    Number of Children: 0  . Years of Education: N/A   Occupational History  . [employer] Other   Social History Main Topics  . Smoking status: Never Smoker   . Smokeless tobacco: Never Used  .  Alcohol Use: Yes     Comment: occasional  . Drug Use: No  . Sexual Activity: Not on file   Other Topics Concern  . Not on file   Social History Narrative  . No narrative on file    ROS Constitutional: Denies fever, chills, weight loss/gain, headaches, insomnia, fatigue, night sweats, and change in appetite. Eyes: Denies redness, blurred vision, diplopia, discharge, itchy, watery eyes.  ENT: Denies discharge, congestion, post nasal drip, epistaxis, sore throat, earache, hearing loss, dental pain, Tinnitus, Vertigo, Sinus pain, snoring.  Cardio: Denies chest pain, palpitations, irregular heartbeat, syncope, dyspnea, diaphoresis, orthopnea, PND, claudication,  edema Respiratory: denies cough, dyspnea, DOE, pleurisy, hoarseness, laryngitis, wheezing.  Gastrointestinal: Denies dysphagia, heartburn, reflux, water brash, pain, cramps, nausea, vomiting, bloating, diarrhea, constipation, hematemesis, melena, hematochezia, jaundice, hemorrhoids Genitourinary: Denies dysuria, frequency, urgency, nocturia, hesitancy, discharge, hematuria, flank pain Musculoskeletal: Denies arthralgia, myalgia, stiffness, Jt. Swelling, pain, limp, and strain/sprain. Skin: Denies puritis, rash, hives, warts, acne, eczema, changing in skin lesion Neuro: No weakness, tremor, incoordination, spasms, paresthesia, pain Psychiatric: Denies confusion, memory loss, sensory loss Endocrine: Denies change in weight, skin, hair change, nocturia, and paresthesia, diabetic polys, visual blurring, hyper / hypo glycemic episodes.  Heme/Lymph: No excessive bleeding, bruising, or elarged lymph nodes.  Physical Exam  BP 142/86  Pulse 72  Temp(Src) 97.9 F (36.6 C) (Temporal)  Resp 16  Ht 5' 11.5" (1.816 m)  Wt 261 lb 6.4 oz (118.57 kg)  BMI 35.95 kg/m2  General Appearance: Well nourished, in no apparent distress. Eyes: PERRLA, EOMs, conjunctiva no swelling or erythema, normal fundi and vessels. Sinuses: No frontal/maxillary tenderness ENT/Mouth: EACs patent / TMs  nl. Nares clear without erythema, swelling, mucoid exudates. Oral hygiene is good. No erythema, swelling, or exudate. Tongue normal, non-obstructing. Tonsils not swollen or erythematous. Hearing normal.  Neck: Supple, thyroid normal. No bruits, nodes or JVD. Respiratory: Respiratory effort normal.  BS equal and clear bilateral without rales, rhonci, wheezing or stridor. Cardio: Heart sounds are normal with regular rate and rhythm and no murmurs, rubs or gallops. Peripheral pulses are normal and equal bilaterally without edema. No aortic or femoral bruits. Chest: symmetric with normal excursions and percussion.  Abdomen: Flat,  soft, with bowl sounds. Nontender, no guarding, rebound, hernias, masses, or organomegaly.  Lymphatics: Non tender without lymphadenopathy.  Genitourinary: No hernias.Testes nl. DRE - prostate nl for age - smooth & firm w/o nodules. Musculoskeletal: Full ROM all peripheral extremities, joint stability, 5/5 strength, and normal gait. Skin: Warm and dry without rashes, lesions, cyanosis, clubbing or  ecchymosis.  Neuro: Cranial nerves intact, reflexes equal bilaterally. Normal muscle tone, no cerebellar symptoms. Sensation intact.  Pysch: Awake and oriented X 3, normal affect, insight and judgment appropriate.   Assessment and Plan  1. Annual Screening Examination 2. Hypertension  3. Hyperlipidemia 4. T2 NIDDM w/ Insulin Resistance 5. Vitamin D Deficiency 6. GERD 7. NASH/Fatty Liver 8. Morbid Obesity (BMI 36)   Continue prudent diet as discussed, weight control, BP monitoring, regular exercise, and medications as discussed.  Discussed med effects and SE's. Routine screening labs and tests as requested with regular follow-up as recommended.

## 2013-07-21 NOTE — Patient Instructions (Addendum)
We are starting you on Metformin to prevent or treat diabetes. Metformin does not cause low blood sugars. In order to create energy your cells need insulin and sugar but sometime you cells do not accept the insulin and this can cause increased sugars and decreased energy. The Metformin helps your cells accept insulin and the sugar to give you more energy.   The two most common side effects are nausea and diarrhea, follow these rules to avoid it! You can take imodium per box instruction when starting metformin if needed.   Rules of metformin: 1) start out slow with only one pill daily. Our goal for you is 4 pills a day or 2000mg  total.  2) take with your largest meal. 3) Take with least amount of carbs.   Call if you have any problems.  Insulin Resistance Blood sugar (glucose) levels are controlled by a hormone called insulin. Insulin is made by your pancreas. When your blood glucose goes up, insulin is released into your blood. Insulin is required for your body to function normally. However, your body can become resistant to your own insulin or to insulin given to treat diabetes. In either case, insulin resistance can lead to serious problems. These problems include:  Type 2 diabetes.  Heart disease.  High blood pressure.  Stroke.  Polycystic ovary syndrome.  Fatty liver. CAUSES  Insulin resistance can develop for many different reasons. It is more likely to happen in people with these conditions or characteristics:  Obesity.  Inactivity.  Pregnancy.  High blood pressure.  Stress.  Steroid use.  Infection or severe illness.  Increased levels of cholesterol and triglycerides. SYMPTOMS  There are no symptoms. You may have symptoms related to the various complications of insulin resistance.  DIAGNOSIS  Several different things can make your caregiver suspect you have insulin resistance. These include:  High blood glucose (hyperglycemia).  Abnormal cholesterol  levels.  High uric acid levels.  Changes related to blood pressure.  Changes related to inflammation. Insulin resistance can be determined with blood tests. An elevated insulin level when you have not eaten might suggest resistance. Other more complicated tests are sometimes necessary. TREATMENT  Lifestyle changes are the most important treatment for insulin resistance.   If you are overweight and you have insulin resistance, you can improve your insulin sensitivity by losing weight.  Moderate exercise for 30 40 minutes, 4 days a week, can improve insulin sensitivity. Some medicines can also help improve your insulin sensitivity. Your caregiver can discuss these with you if they are appropriate.  HOME CARE INSTRUCTIONS   Do not smoke.  Keep your weight at a healthy level.  Get exercise.  If you have diabetes, follow your caregiver's directions.  If you have high blood pressure, follow your caregiver's directions.  Only take prescription medicines for pain, fever, or discomfort as directed by your caregiver. SEEK MEDICAL CARE IF:   You are diabetic and you are having problems keeping your blood glucose levels at target range.  You are having episodes of low blood glucose (hypoglycemia).  You feel you might be having side effects from your medicines.  You have symptoms of an illness that is not improving after 3 4 days.  You have a sore or wound that is not healing.  You notice a change in vision or a new problem with your vision. SEEK IMMEDIATE MEDICAL CARE IF:   Your blood glucose goes below 70, especially if you have confusion, lightheadedness, or other symptoms with it.  Your blood glucose is very high (as advised by your caregiver) twice in a row.  You pass out.  You have chest pain or trouble breathing.  You have a sudden, severe headache.  You have sudden weakness in one arm or one leg.  You have sudden difficulty speaking or swallowing.  You develop  vomiting or diarrhea that is getting worse or not improving after 1 day. Document Released: 06/14/2005 Document Revised: 10/25/2011 Document Reviewed: 10/04/2012 [facility] Patient Information 2014 [place], [place].    Hypertension As your heart beats, it forces blood through your arteries. This force is your blood pressure. If the pressure is too high, it is called hypertension (HTN) or high blood pressure. HTN is dangerous because you may have it and not know it. High blood pressure may mean that your heart has to work harder to pump blood. Your arteries may be narrow or stiff. The extra work puts you at risk for heart disease, stroke, and other problems.  Blood pressure consists of two numbers, a higher number over a lower, 110/72, for example. It is stated as "110 over 72." The ideal is below 120 for the top number (systolic) and under 80 for the bottom (diastolic). Write down your blood pressure today. You should pay close attention to your blood pressure if you have certain conditions such as:  Heart failure.  Prior heart attack.  Diabetes  Chronic kidney disease.  Prior stroke.  Multiple risk factors for heart disease. To see if you have HTN, your blood pressure should be measured while you are seated with your arm held at the level of the heart. It should be measured at least twice. A one-time elevated blood pressure reading (especially in the Emergency Department) does not mean that you need treatment. There may be conditions in which the blood pressure is different between your right and left arms. It is important to see your caregiver soon for a recheck. Most people have essential hypertension which means that there is not a specific cause. This type of high blood pressure may be lowered by changing lifestyle factors such as:  Stress.  Smoking.  Lack of exercise.  Excessive weight.  Drug/tobacco/alcohol use.  Eating less salt. Most people do not have symptoms from high  blood pressure until it has caused damage to the body. Effective treatment can often prevent, delay or reduce that damage. TREATMENT  When a cause has been identified, treatment for high blood pressure is directed at the cause. There are a large number of medications to treat HTN. These fall into several categories, and your caregiver will help you select the medicines that are best for you. Medications may have side effects. You should review side effects with your caregiver. If your blood pressure stays high after you have made lifestyle changes or started on medicines,   Your medication(s) may need to be changed.  Other problems may need to be addressed.  Be certain you understand your prescriptions, and know how and when to take your medicine.  Be sure to follow up with your caregiver within the time frame advised (usually within two weeks) to have your blood pressure rechecked and to review your medications.  If you are taking more than one medicine to lower your blood pressure, make sure you know how and at what times they should be taken. Taking two medicines at the same time can result in blood pressure that is too low. SEEK IMMEDIATE MEDICAL CARE IF:  You develop a severe headache,  blurred or changing vision, or confusion.  You have unusual weakness or numbness, or a faint feeling.  You have severe chest or abdominal pain, vomiting, or breathing problems. MAKE SURE YOU:   Understand these instructions.  Will watch your condition.  Will get help right away if you are not doing well or get worse.   Diabetes and Exercise Exercising regularly is important. It is not just about losing weight. It has many health benefits, such as:  Improving your overall fitness, flexibility, and endurance.  Increasing your bone density.  Helping with weight control.  Decreasing your body fat.  Increasing your muscle strength.  Reducing stress and tension.  Improving your overall  health. People with diabetes who exercise gain additional benefits because exercise:  Reduces appetite.  Improves the body's use of blood sugar (glucose).  Helps lower or control blood glucose.  Decreases blood pressure.  Helps control blood lipids (such as cholesterol and triglycerides).  Improves the body's use of the hormone insulin by:  Increasing the body's insulin sensitivity.  Reducing the body's insulin needs.  Decreases the risk for heart disease because exercising:  Lowers cholesterol and triglycerides levels.  Increases the levels of good cholesterol (such as high-density lipoproteins [HDL]) in the body.  Lowers blood glucose levels. YOUR ACTIVITY PLAN  Choose an activity that you enjoy and set realistic goals. Your health care provider or diabetes educator can help you make an activity plan that works for you. You can break activities into 2 or 3 sessions throughout the day. Doing so is as good as one long session. Exercise ideas include:  Taking the dog for a walk.  Taking the stairs instead of the elevator.  Dancing to your favorite song.  Doing your favorite exercise with a friend. RECOMMENDATIONS FOR EXERCISING WITH TYPE 1 OR TYPE 2 DIABETES   Check your blood glucose before exercising. If blood glucose levels are greater than 240 mg/dL, check for urine ketones. Do not exercise if ketones are present.  Avoid injecting insulin into areas of the body that are going to be exercised. For example, avoid injecting insulin into:  The arms when playing tennis.  The legs when jogging.  Keep a record of:  Food intake before and after you exercise.  Expected peak times of insulin action.  Blood glucose levels before and after you exercise.  The type and amount of exercise you have done.  Review your records with your health care provider. Your health care provider will help you to develop guidelines for adjusting food intake and insulin amounts before and  after exercising.  If you take insulin or oral hypoglycemic agents, watch for signs and symptoms of hypoglycemia. They include:  Dizziness.  Shaking.  Sweating.  Chills.  Confusion.  Drink plenty of water while you exercise to prevent dehydration or heat stroke. Body water is lost during exercise and must be replaced.  Talk to your health care provider before starting an exercise program to make sure it is safe for you. Remember, almost any type of activity is better than none.    Cholesterol Cholesterol is a white, waxy, fat-like protein needed by your body in small amounts. The liver makes all the cholesterol you need. It is carried from the liver by the blood through the blood vessels. Deposits (plaque) may build up on blood vessel walls. This makes the arteries narrower and stiffer. Plaque increases the risk for heart attack and stroke. You cannot feel your cholesterol level even if it is  very high. The only way to know is by a blood test to check your lipid (fats) levels. Once you know your cholesterol levels, you should keep a record of the test results. Work with your caregiver to to keep your levels in the desired range. WHAT THE RESULTS MEAN:  Total cholesterol is a rough measure of all the cholesterol in your blood.  LDL is the so-called bad cholesterol. This is the type that deposits cholesterol in the walls of the arteries. You want this level to be low.  HDL is the good cholesterol because it cleans the arteries and carries the LDL away. You want this level to be high.  Triglycerides are fat that the body can either burn for energy or store. High levels are closely linked to heart disease. DESIRED LEVELS:  Total cholesterol below 200.  LDL below 100 for people at risk, below 70 for very high risk.  HDL above 50 is good, above 60 is best.  Triglycerides below 150. HOW TO LOWER YOUR CHOLESTEROL:  Diet.  Choose fish or white meat chicken and [place], roasted or  baked. Limit fatty cuts of red meat, fried foods, and processed meats, such as sausage and lunch meat.  Eat lots of fresh fruits and vegetables. Choose whole grains, beans, pasta, potatoes and cereals.  Use only small amounts of olive, corn or canola oils. Avoid butter, mayonnaise, shortening or palm kernel oils. Avoid foods with trans-fats.  Use skim/nonfat milk and low-fat/nonfat yogurt and cheeses. Avoid whole milk, cream, ice cream, egg yolks and cheeses. Healthy desserts include angel food cake, ginger snaps, animal crackers, hard candy, popsicles, and low-fat/nonfat frozen yogurt. Avoid pastries, cakes, pies and cookies.  Exercise.  A regular program helps decrease LDL and raises HDL.  Helps with weight control.  Do things that increase your activity level like gardening, walking, or taking the stairs.  Medication.  May be prescribed by your caregiver to help lowering cholesterol and the risk for heart disease.  You may need medicine even if your levels are normal if you have several risk factors. HOME CARE INSTRUCTIONS   Follow your diet and exercise programs as suggested by your caregiver.  Take medications as directed.  Have blood work done when your caregiver feels it is necessary. MAKE SURE YOU:   Understand these instructions.  Will watch your condition.  Will get help right away if you are not doing well or get worse.      Vitamin D Deficiency Vitamin D is an important vitamin that your body needs. Having too little of it in your body is called a deficiency. A very bad deficiency can make your bones soft and can cause a condition called rickets.  Vitamin D is important to your body for different reasons, such as:   It helps your body absorb 2 minerals called calcium and phosphorus.  It helps make your bones healthy.  It may prevent some diseases, such as diabetes and multiple sclerosis.  It helps your muscles and heart. You can get vitamin D in several  ways. It is a natural part of some foods. The vitamin is also added to some dairy products and cereals. Some people take vitamin D supplements. Also, your body makes vitamin D when you are in the sun. It changes the sun's rays into a form of the vitamin that your body can use. CAUSES   Not eating enough foods that contain vitamin D.  Not getting enough sunlight.  Having certain digestive system diseases  that make it hard to absorb vitamin D. These diseases include Crohn's disease, chronic pancreatitis, and cystic fibrosis.  Having a surgery in which part of the stomach or small intestine is removed.  Being obese. Fat cells pull vitamin D out of your blood. That means that obese people may not have enough vitamin D left in their blood and in other body tissues.  Having chronic kidney or liver disease. RISK FACTORS Risk factors are things that make you more likely to develop a vitamin D deficiency. They include:  Being older.  Not being able to get outside very much.  Living in a nursing home.  Having had broken bones.  Having weak or thin bones (osteoporosis).  Having a disease or condition that changes how your body absorbs vitamin D.  Having dark skin.  Some medicines such as seizure medicines or steroids.  Being overweight or obese. SYMPTOMS Mild cases of vitamin D deficiency may not have any symptoms. If you have a very bad case, symptoms may include:  Bone pain.  Muscle pain.  Falling often.  Broken bones caused by a minor injury, due to osteoporosis. DIAGNOSIS A blood test is the best way to tell if you have a vitamin D deficiency. TREATMENT Vitamin D deficiency can be treated in different ways. Treatment for vitamin D deficiency depends on what is causing it. Options include:  Taking vitamin D supplements.  Taking a calcium supplement. Your caregiver will suggest what dose is best for you. HOME CARE INSTRUCTIONS  Take any supplements that your caregiver  prescribes. Follow the directions carefully. Take only the suggested amount.  Have your blood tested 2 months after you start taking supplements.  Eat foods that contain vitamin D. Healthy choices include:  Fortified dairy products, cereals, or juices. Fortified means vitamin D has been added to the food. Check the label on the package to be sure.  Fatty fish like salmon or trout.  Eggs.  Oysters.  Do not use a tanning bed.  Keep your weight at a healthy level. Lose weight if you need to.  Keep all follow-up appointments. Your caregiver will need to perform blood tests to make sure your vitamin D deficiency is going away. SEEK MEDICAL CARE IF:  You have any questions about your treatment.  You continue to have symptoms of vitamin D deficiency.  You have nausea or vomiting.  You are constipated.  You feel confused.  You have severe abdominal or back pain. MAKE SURE YOU:  Understand these instructions.  Will watch your condition.  Will get help right away if you are not doing well or get worse.  Fatty Liver Fatty liver is the accumulation of fat in liver cells. It is also called hepatosteatosis or steatohepatitis. It is normal for your liver to contain some fat. If fat is more than 5 to 10% of your liver's weight, you have fatty liver.  There are often no symptoms (problems) for years while damage is still occurring. People often learn about their fatty liver when they have medical tests for other reasons. Fat can damage your liver for years or even decades without causing problems. When it becomes severe, it can cause fatigue, weight loss, weakness, and confusion. This makes you more likely to develop more serious liver problems. The liver is the largest organ in the body. It does a lot of work and often gives no warning signs when it is sick until late in a disease. The liver has many important jobs including:  Breaking down foods.  Storing vitamins, iron, and other  minerals.  Making proteins.  Making bile for food digestion.  Breaking down many products including medications, alcohol and some poisons. CAUSES  There are a number of different conditions, medications, and poisons that can cause a fatty liver. Eating too many calories causes fat to build up in the liver. Not processing and breaking fats down normally may also cause this. Certain conditions, such as obesity, diabetes, and high triglycerides also cause this. Most fatty liver patients tend to be middle-aged and over weight.  Some causes of fatty liver are:  Alcohol over consumption.  Malnutrition.  Steroid use.  Valproic acid toxicity.  Obesity.  Cushing's syndrome.  Poisons.  Tetracycline in high dosages.  Pregnancy.  Diabetes.  Hyperlipidemia.  Rapid weight loss. Some people develop fatty liver even having none of these conditions. SYMPTOMS  Fatty liver most often causes no problems. This is called asymptomatic.  It can be diagnosed with blood tests and also by a liver biopsy.  It is one of the most common causes of minor elevations of liver enzymes on routine blood tests.  Specialized Imaging of the liver using ultrasound, CT (computed tomography) scan, or MRI (magnetic resonance imaging) can suggest a fatty liver but a biopsy is needed to confirm it.  A biopsy involves taking a small sample of liver tissue. This is done by using a needle. It is then looked at under a microscope by a specialist. TREATMENT  It is important to treat the cause. Simple fatty liver without a medical reason may not need treatment.  Weight loss, fat restriction, and exercise in overweight patients produces inconsistent results but is worth trying.  Fatty liver due to alcohol toxicity may not improve even with stopping drinking.  Good control of diabetes may reduce fatty liver.  Lower your triglycerides through diet, medication or both.  Eat a balanced, healthy diet.  Increase your  physical activity.  Get regular checkups from a liver specialist.  There are no medical or surgical treatments for a fatty liver or NASH, but improving your diet and increasing your exercise may help prevent or reverse some of the damage. PROGNOSIS  Fatty liver may cause no damage or it can lead to an inflammation of the liver. This is, called steatohepatitis. When it is linked to alcohol abuse, it is called alcoholic steatohepatitis. It often is not linked to alcohol. It is then called nonalcoholic steatohepatitis, or NASH. Over time the liver may become scarred and hardened. This condition is called cirrhosis. Cirrhosis is serious and may lead to liver failure or cancer. NASH is one of the leading causes of cirrhosis. About 10-20% of Americans have fatty liver and a smaller 2-5% has NASH. Document Released: 06/10/2005 Document Revised: 07/18/2011 Document Reviewed: 08/03/2005 [facility] Patient Information 2014 [place].

## 2013-07-22 ENCOUNTER — Ambulatory Visit (INDEPENDENT_AMBULATORY_CARE_PROVIDER_SITE_OTHER): Payer: BC Managed Care – PPO | Admitting: Internal Medicine

## 2013-07-22 ENCOUNTER — Encounter: Payer: Self-pay | Admitting: Internal Medicine

## 2013-07-22 VITALS — BP 142/86 | HR 72 | Temp 97.9°F | Resp 16 | Ht 71.5 in | Wt 261.4 lb

## 2013-07-22 DIAGNOSIS — Z125 Encounter for screening for malignant neoplasm of prostate: Secondary | ICD-10-CM

## 2013-07-22 DIAGNOSIS — Z111 Encounter for screening for respiratory tuberculosis: Secondary | ICD-10-CM

## 2013-07-22 DIAGNOSIS — Z8709 Personal history of other diseases of the respiratory system: Secondary | ICD-10-CM

## 2013-07-22 DIAGNOSIS — E559 Vitamin D deficiency, unspecified: Secondary | ICD-10-CM

## 2013-07-22 DIAGNOSIS — R7401 Elevation of levels of liver transaminase levels: Secondary | ICD-10-CM

## 2013-07-22 DIAGNOSIS — Z79899 Other long term (current) drug therapy: Secondary | ICD-10-CM

## 2013-07-22 DIAGNOSIS — R740 Nonspecific elevation of levels of transaminase and lactic acid dehydrogenase [LDH]: Secondary | ICD-10-CM

## 2013-07-22 DIAGNOSIS — R7402 Elevation of levels of lactic acid dehydrogenase (LDH): Secondary | ICD-10-CM

## 2013-07-22 DIAGNOSIS — Z0000 Encounter for general adult medical examination without abnormal findings: Secondary | ICD-10-CM

## 2013-07-22 DIAGNOSIS — I10 Essential (primary) hypertension: Secondary | ICD-10-CM

## 2013-07-22 DIAGNOSIS — Z1212 Encounter for screening for malignant neoplasm of rectum: Secondary | ICD-10-CM

## 2013-07-22 DIAGNOSIS — Z113 Encounter for screening for infections with a predominantly sexual mode of transmission: Secondary | ICD-10-CM

## 2013-07-22 DIAGNOSIS — E8881 Metabolic syndrome: Secondary | ICD-10-CM

## 2013-07-22 LAB — HEMOGLOBIN A1C
Hgb A1c MFr Bld: 6.1 % — ABNORMAL HIGH (ref <5.7)
MEAN PLASMA GLUCOSE: 128 mg/dL — AB (ref <117)

## 2013-07-22 LAB — LIPID PANEL
CHOLESTEROL: 171 mg/dL (ref 0–200)
HDL: 26 mg/dL — ABNORMAL LOW (ref >39)
LDL Cholesterol: 116 mg/dL — ABNORMAL HIGH (ref 0–99)
TRIGLYCERIDES: 143 mg/dL (ref <150)
Total CHOL/HDL Ratio: 6.6 Ratio
VLDL: 29 mg/dL (ref 0–40)

## 2013-07-22 LAB — URINALYSIS, MICROSCOPIC ONLY
BACTERIA UA: NONE SEEN
Casts: NONE SEEN
Crystals: NONE SEEN
Squamous Epithelial / LPF: NONE SEEN

## 2013-07-22 LAB — MAGNESIUM: Magnesium: 1.8 mg/dL (ref 1.5–2.5)

## 2013-07-22 LAB — HEPATITIS B CORE ANTIBODY, TOTAL: Hep B Core Total Ab: NONREACTIVE

## 2013-07-22 LAB — HEPATITIS A ANTIBODY, TOTAL: HEP A TOTAL AB: NONREACTIVE

## 2013-07-22 LAB — BASIC METABOLIC PANEL WITH GFR
BUN: 13 mg/dL (ref 6–23)
CO2: 31 mEq/L (ref 19–32)
Calcium: 9.2 mg/dL (ref 8.4–10.5)
Chloride: 99 mEq/L (ref 96–112)
Creat: 0.88 mg/dL (ref 0.50–1.35)
Glucose, Bld: 118 mg/dL — ABNORMAL HIGH (ref 70–99)
POTASSIUM: 4.3 meq/L (ref 3.5–5.3)
SODIUM: 139 meq/L (ref 135–145)

## 2013-07-22 LAB — HEPATIC FUNCTION PANEL
ALT: 80 U/L — ABNORMAL HIGH (ref 0–53)
AST: 78 U/L — ABNORMAL HIGH (ref 0–37)
Albumin: 4.3 g/dL (ref 3.5–5.2)
Alkaline Phosphatase: 51 U/L (ref 39–117)
Bilirubin, Direct: 0.2 mg/dL (ref 0.0–0.3)
Indirect Bilirubin: 0.6 mg/dL (ref 0.2–1.2)
TOTAL PROTEIN: 7 g/dL (ref 6.0–8.3)
Total Bilirubin: 0.8 mg/dL (ref 0.2–1.2)

## 2013-07-22 LAB — MICROALBUMIN / CREATININE URINE RATIO
CREATININE, URINE: 238.2 mg/dL
MICROALB UR: 3.1 mg/dL — AB (ref 0.00–1.89)
Microalb Creat Ratio: 13 mg/g (ref 0.0–30.0)

## 2013-07-22 LAB — TESTOSTERONE: TESTOSTERONE: 463 ng/dL (ref 300–890)

## 2013-07-22 LAB — CBC WITH DIFFERENTIAL/PLATELET
BASOS ABS: 0 10*3/uL (ref 0.0–0.1)
BASOS PCT: 0 % (ref 0–1)
Eosinophils Absolute: 0.2 10*3/uL (ref 0.0–0.7)
Eosinophils Relative: 3 % (ref 0–5)
HCT: 42.7 % (ref 39.0–52.0)
Hemoglobin: 15 g/dL (ref 13.0–17.0)
Lymphocytes Relative: 29 % (ref 12–46)
Lymphs Abs: 1.5 10*3/uL (ref 0.7–4.0)
MCH: 31.1 pg (ref 26.0–34.0)
MCHC: 35.1 g/dL (ref 30.0–36.0)
MCV: 88.6 fL (ref 78.0–100.0)
MONO ABS: 0.4 10*3/uL (ref 0.1–1.0)
Monocytes Relative: 8 % (ref 3–12)
NEUTROS ABS: 3.2 10*3/uL (ref 1.7–7.7)
NEUTROS PCT: 60 % (ref 43–77)
Platelets: 236 10*3/uL (ref 150–400)
RBC: 4.82 MIL/uL (ref 4.22–5.81)
RDW: 13.2 % (ref 11.5–15.5)
WBC: 5.3 10*3/uL (ref 4.0–10.5)

## 2013-07-22 LAB — VITAMIN B12: VITAMIN B 12: 440 pg/mL (ref 211–911)

## 2013-07-22 LAB — TSH: TSH: 2.05 u[IU]/mL (ref 0.350–4.500)

## 2013-07-22 LAB — HEPATITIS B SURFACE ANTIBODY,QUALITATIVE: Hep B S Ab: NEGATIVE

## 2013-07-22 LAB — PSA: PSA: 0.53 ng/mL (ref <=4.00)

## 2013-07-22 LAB — HEPATITIS C ANTIBODY: HCV Ab: NEGATIVE

## 2013-07-22 LAB — HIV ANTIBODY (ROUTINE TESTING W REFLEX): HIV: NONREACTIVE

## 2013-07-22 LAB — RPR

## 2013-07-22 MED ORDER — AZELASTINE HCL 0.1 % NA SOLN
2.0000 | Freq: Two times a day (BID) | NASAL | Status: DC
Start: 2013-07-22 — End: 2015-05-19

## 2013-07-22 MED ORDER — METFORMIN HCL ER 500 MG PO TB24: ORAL_TABLET | ORAL | Status: DC

## 2013-07-23 LAB — VITAMIN D 25 HYDROXY (VIT D DEFICIENCY, FRACTURES): VIT D 25 HYDROXY: 70 ng/mL (ref 30–89)

## 2013-07-23 LAB — INSULIN, FASTING: INSULIN FASTING, SERUM: 29 u[IU]/mL — AB (ref 3–28)

## 2013-07-24 LAB — HEPATITIS B E ANTIBODY: Hepatitis Be Antibody: NONREACTIVE

## 2013-07-25 LAB — TB SKIN TEST
Induration: 0 mm
TB Skin Test: NEGATIVE

## 2013-10-08 ENCOUNTER — Other Ambulatory Visit: Payer: Self-pay | Admitting: Internal Medicine

## 2013-10-08 ENCOUNTER — Other Ambulatory Visit: Payer: Self-pay | Admitting: Emergency Medicine

## 2013-10-23 ENCOUNTER — Ambulatory Visit: Payer: Self-pay | Admitting: Physician Assistant

## 2014-01-16 ENCOUNTER — Other Ambulatory Visit: Payer: Self-pay | Admitting: Internal Medicine

## 2014-01-24 ENCOUNTER — Ambulatory Visit: Payer: BC Managed Care – PPO | Admitting: Internal Medicine

## 2014-01-24 NOTE — Progress Notes (Signed)
Patient ID: Sean Maldonado, male   DOB: [birth year], 58 y.o.   MRN: [id number]   [name]

## 2014-02-20 ENCOUNTER — Ambulatory Visit: Payer: BC Managed Care – PPO | Admitting: Internal Medicine

## 2014-04-17 ENCOUNTER — Other Ambulatory Visit: Payer: Self-pay | Admitting: *Deleted

## 2014-04-17 MED ORDER — LISINOPRIL-HYDROCHLOROTHIAZIDE 20-12.5 MG PO TABS: ORAL_TABLET | ORAL | Status: DC

## 2014-04-17 MED ORDER — OMEPRAZOLE-SODIUM BICARBONATE 40-1100 MG PO CAPS: ORAL_CAPSULE | ORAL | Status: DC

## 2014-04-17 MED ORDER — EZETIMIBE 10 MG PO TABS: ORAL_TABLET | ORAL | Status: DC

## 2014-04-18 ENCOUNTER — Other Ambulatory Visit: Payer: Self-pay | Admitting: *Deleted

## 2014-04-18 MED ORDER — OMEPRAZOLE 40 MG PO CPDR
40.0000 mg | DELAYED_RELEASE_CAPSULE | Freq: Every day | ORAL | Status: DC
Start: 2014-04-18 — End: 2014-07-23

## 2014-07-23 ENCOUNTER — Ambulatory Visit (INDEPENDENT_AMBULATORY_CARE_PROVIDER_SITE_OTHER): Payer: BLUE CROSS/BLUE SHIELD | Admitting: Internal Medicine

## 2014-07-23 ENCOUNTER — Other Ambulatory Visit: Payer: Self-pay | Admitting: *Deleted

## 2014-07-23 ENCOUNTER — Encounter: Payer: Self-pay | Admitting: Internal Medicine

## 2014-07-23 VITALS — BP 136/90 | HR 72 | Temp 97.5°F | Resp 16 | Ht 71.5 in | Wt 242.6 lb

## 2014-07-23 DIAGNOSIS — Z125 Encounter for screening for malignant neoplasm of prostate: Secondary | ICD-10-CM

## 2014-07-23 DIAGNOSIS — E782 Mixed hyperlipidemia: Secondary | ICD-10-CM

## 2014-07-23 DIAGNOSIS — R7303 Prediabetes: Secondary | ICD-10-CM

## 2014-07-23 DIAGNOSIS — Z1212 Encounter for screening for malignant neoplasm of rectum: Secondary | ICD-10-CM

## 2014-07-23 DIAGNOSIS — K219 Gastro-esophageal reflux disease without esophagitis: Secondary | ICD-10-CM

## 2014-07-23 DIAGNOSIS — K7581 Nonalcoholic steatohepatitis (NASH): Secondary | ICD-10-CM

## 2014-07-23 DIAGNOSIS — I10 Essential (primary) hypertension: Secondary | ICD-10-CM

## 2014-07-23 DIAGNOSIS — R5383 Other fatigue: Secondary | ICD-10-CM

## 2014-07-23 DIAGNOSIS — Z79899 Other long term (current) drug therapy: Secondary | ICD-10-CM

## 2014-07-23 DIAGNOSIS — E669 Obesity, unspecified: Secondary | ICD-10-CM | POA: Insufficient documentation

## 2014-07-23 DIAGNOSIS — E559 Vitamin D deficiency, unspecified: Secondary | ICD-10-CM

## 2014-07-23 DIAGNOSIS — Z111 Encounter for screening for respiratory tuberculosis: Secondary | ICD-10-CM

## 2014-07-23 DIAGNOSIS — R7309 Other abnormal glucose: Secondary | ICD-10-CM

## 2014-07-23 MED ORDER — ATENOLOL 100 MG PO TABS: ORAL_TABLET | ORAL | Status: DC

## 2014-07-23 MED ORDER — OMEPRAZOLE 40 MG PO CPDR: 40.0000 mg | DELAYED_RELEASE_CAPSULE | Freq: Every day | ORAL | Status: DC

## 2014-07-23 NOTE — Progress Notes (Signed)
Patient ID: ANTWION CARPENTER, male   DOB: 11-05-1955, 59 y.o.   MRN: [id number] Annual Comprehensive Examination  This very nice 59 y.o. SWM presents for complete physical.  Patient has been followed for HTN, T2_NIDDM, Hyperlipidemia, and Vitamin D Deficiency.   HTN predates since 2004. Patient's BP has been controlled at home.Today's BP: 136/90 mmHg. Patient denies any cardiac symptoms as chest pain, palpitations, shortness of breath, dizziness or ankle swelling.   Patient's hyperlipidemia is controlled with diet and medications. Patient denies myalgias or other medication SE's. Last lipids were not at goal as below.   Lab Results  Component Value Date   CHOL 171 07/22/2013   HDL 26* 07/22/2013   LDLCALC 116* 07/22/2013   TRIG 143 07/22/2013   CHOLHDL 6.6 07/22/2013    Patient had been prediabetic since Feb 2011 (A1c 5.9%) and was dx'd T2_NIDDM in Oct 2014 with A1c of 6.6% and patient denies reactive hypoglycemic symptoms, visual blurring, diabetic polys or paresthesias. Last A1c was 6.1% in Mar 2015.  Patient had been started on Metformin which he stopped due to diarrhea.  Finally, patient has history of Vitamin D Deficiency of 57 inn 2008 and last vitamin D was 70 in Mar 2014.  Medication Sig  . aspirin 81 MG tablet Take 81 mg by mouth daily.  Marland Kitchen VITAMIN D 5000 UNITS CAPS Take 1 capsule by mouth daily.  Marland Kitchen ezetimibe (ZETIA) 10 MG tablet TAKE ONE TABLET DAILY FOR CHOLESTEROL.  . fexofenadine (ALLEGRA) 180 MG tablet Take 180 mg by mouth daily as needed (allergies).  Marland Kitchen lisinopril-hctz 20-12.5 MG per tablet TAKE 1 TABLET DAILY FOR BLOOD PRESSURE.  . Multiple Vitamin  Take 1 cap daily.  Marland Kitchen atenolol (TENORMIN) 100 MG tablet TAKE 1 TABLET DAILY FOR BLOOD PRESSURE.  Marland Kitchen omeprazole (PRILOSEC) 40 MG capsule Take 1 capsule (40 mg total) by mouth daily.  Marland Kitchen azelastine (ASTELIN) 137 MCG/SPRAY nasal spray Place 2 sprays into both nostrils 2 (two) times daily. Use in each nostril as directed  . metFORMIN  (GLUCOPHAGE XR) 500 MG 24 hr tablet 1 to 4 pills daily after a meal as directed for Diabetes and Insulin Resistance (Patient not taking: Reported on 07/23/2014)   Allergies  Allergen Reactions  . Lipitor [Atorvastatin]     Myalgias   . Zocor [Simvastatin]     Myalgias   Past Medical History  Diagnosis Date  . Seasonal allergies   . Cholelithiasis   . Hepatic steatosis   . Obesity   . GERD (gastroesophageal reflux disease)   . Dysrhythmia     RBBB-incomplete  . Arthritis     pt denies  . Asthma   . Hypercholesteremia   . Hypertension   . Prediabetes   . Vitamin D deficiency   . Fatty liver    Health Maintenance  Topic Date Due  . INFLUENZA VACCINE  12/07/2013  . COLONOSCOPY  07/31/2021  . TETANUS/TDAP  07/19/2022  . HIV Screening  Completed   Immunization History  Administered Date(s) Administered  . Influenza Split 02/26/2013  . PPD Test 07/22/2013, 07/23/2014  . Pneumococcal Polysaccharide-23 05/22/2008  . Tdap 07/18/2012   Past Surgical History  Procedure Laterality Date  . Tonsillectomy    . Wisdom tooth extraction    . Axillary lymph node biopsy Right 08/27/2012    Procedure: AXILLARY LYMPH NODE BIOPSY ;  Surgeon: Rolm Bookbinder, MD;  Location: [facility];  Service: General;  Laterality: Right;  . Cholecystectomy N/A 12/28/2012    Procedure: LAPAROSCOPIC CHOLECYSTECTOMY  WITH INTRAOPERATIVE CHOLANGIOGRAM;  Surgeon: Joyice Faster. Cornett, MD;  Location: [facility];  Service: General;  Laterality: N/A;   Family History  Problem Relation Age of Onset  . Diabetes Mother   . Heart disease Mother   . Colon polyps Neg Hx   . Colon cancer Neg Hx    History   Social History  . Marital Status: Single    Spouse Name: N/A  . Number of Children: 0  . Years of Education: N/A   Occupational History  . [employer] Other   Social History Main Topics  . Smoking status: Never Smoker   . Smokeless tobacco: Never Used  . Alcohol Use: Yes     Comment: occasional   . Drug Use: No  . Sexual Activity: Not on file    ROS Constitutional: Denies fever, chills, weight loss/gain, headaches, insomnia,  night sweats or change in appetite. c/o fatigue. Eyes: Denies redness, blurred vision, diplopia, discharge, itchy or watery eyes.  ENT: Denies discharge, congestion, post nasal drip, epistaxis, sore throat, earache, hearing loss, dental pain, Tinnitus, Vertigo, Sinus pain or snoring.  Cardio: Denies chest pain, palpitations, irregular heartbeat, syncope, dyspnea, diaphoresis, orthopnea, PND, claudication or edema Respiratory: denies cough, dyspnea, DOE, pleurisy, hoarseness, laryngitis or wheezing.  Gastrointestinal: Denies dysphagia, heartburn, reflux, water brash, pain, cramps, nausea, vomiting, bloating, diarrhea, constipation, hematemesis, melena, hematochezia, jaundice or hemorrhoids Genitourinary: Denies dysuria, frequency, urgency, nocturia, hesitancy, discharge, hematuria or flank pain Musculoskeletal: Denies arthralgia, myalgia, stiffness, Jt. Swelling, pain, limp or strain/sprain. Denies Falls. Skin: Denies puritis, rash, hives, warts, acne, eczema or change in skin lesion Neuro: No weakness, tremor, incoordination, spasms, paresthesia or pain Psychiatric: Denies confusion, memory loss or sensory loss. Denies Depression. Endocrine: Denies change in weight, skin, hair change, nocturia, and paresthesia, diabetic polys, visual blurring or hyper / hypo glycemic episodes.  Heme/Lymph: No excessive bleeding, bruising or enlarged lymph nodes.  Physical Exam  BP 136/90   Pulse 72  Temp 97.5 F   Resp 16  Ht 5' 11.5"   Wt 242 lb 9.6 oz    BMI 33.37   General Appearance: Well nourished, in no apparent distress. Eyes: PERRLA, EOMs, conjunctiva no swelling or erythema, normal fundi and vessels. Sinuses: No frontal/maxillary tenderness ENT/Mouth: EACs patent / TMs  nl. Nares clear without erythema, swelling, mucoid exudates. Oral hygiene is good. No  erythema, swelling, or exudate. Tongue normal, non-obstructing. Tonsils not swollen or erythematous. Hearing normal.  Neck: Supple, thyroid normal. No bruits, nodes or JVD. Respiratory: Respiratory effort normal.  BS equal and clear bilateral without rales, rhonci, wheezing or stridor. Cardio: Heart sounds are normal with regular rate and rhythm and no murmurs, rubs or gallops. Peripheral pulses are normal and equal bilaterally without edema. No aortic or femoral bruits. Chest: symmetric with normal excursions and percussion.  Abdomen: Flat, soft, with bowl sounds. Nontender, no guarding, rebound, hernias, masses, or organomegaly.  Lymphatics: Non tender without lymphadenopathy.  Genitourinary: No hernias.Testes nl. DRE - prostate nl for age - smooth & firm w/o nodules. Musculoskeletal: Full ROM all peripheral extremities, joint stability, 5/5 strength, and normal gait. Skin: Warm and dry without rashes, lesions, cyanosis, clubbing or  ecchymosis.  Neuro: Cranial nerves intact, reflexes equal bilaterally. Normal muscle tone, no cerebellar symptoms. Sensation intact.  Pysch: Awake and oriented X 3 with normal affect, insight and judgment appropriate.   Assessment and Plan  1. Essential hypertension  - Microalbumin / creatinine urine ratio - EKG 12-Lead - [place], RETROPERITNL ABD,  LTD  2. Hyperlipidemia  - Lipid panel  3. 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 Vitamin D deficiency  - Vit D  25 hydroxy   5. Gastroesophageal reflux disease   6. NASH (nonalcoholic steatohepatitis)   7. Screening for rectal cancer  - POC Hemoccult Bld/Stl   8. Prostate cancer screening  - PSA  9. Other fatigue  - Vitamin B12 - Testosterone - Iron and TIBC - TSH  10. Medication management  - Urine Microscopic - CBC with Differential/Platelet - BASIC METABOLIC PANEL WITH GFR - Hepatic function panel - Magnesium   Continue prudent diet as  discussed, weight control, BP monitoring, regular exercise, and medications as discussed.  Discussed med effects and SE's. Routine screening labs and tests as requested with regular follow-up as recommended.

## 2014-07-23 NOTE — Patient Instructions (Signed)

## 2014-07-24 LAB — URINALYSIS, MICROSCOPIC ONLY
BACTERIA UA: NONE SEEN
CASTS: NONE SEEN
CRYSTALS: NONE SEEN
Squamous Epithelial / LPF: NONE SEEN

## 2014-07-24 LAB — HEPATIC FUNCTION PANEL
ALT: 74 U/L — AB (ref 0–53)
AST: 75 U/L — ABNORMAL HIGH (ref 0–37)
Albumin: 4.6 g/dL (ref 3.5–5.2)
Alkaline Phosphatase: 55 U/L (ref 39–117)
BILIRUBIN TOTAL: 1.1 mg/dL (ref 0.2–1.2)
Bilirubin, Direct: 0.2 mg/dL (ref 0.0–0.3)
Indirect Bilirubin: 0.9 mg/dL (ref 0.2–1.2)
TOTAL PROTEIN: 7.4 g/dL (ref 6.0–8.3)

## 2014-07-24 LAB — BASIC METABOLIC PANEL WITH GFR
BUN: 12 mg/dL (ref 6–23)
CO2: 23 mEq/L (ref 19–32)
Calcium: 9.7 mg/dL (ref 8.4–10.5)
Chloride: 98 mEq/L (ref 96–112)
Creat: 0.89 mg/dL (ref 0.50–1.35)
Glucose, Bld: 105 mg/dL — ABNORMAL HIGH (ref 70–99)
POTASSIUM: 4.4 meq/L (ref 3.5–5.3)
SODIUM: 138 meq/L (ref 135–145)

## 2014-07-24 LAB — MICROALBUMIN / CREATININE URINE RATIO
CREATININE, URINE: 425.4 mg/dL
MICROALB UR: 3.7 mg/dL — AB (ref <2.0)
Microalb Creat Ratio: 8.7 mg/g (ref 0.0–30.0)

## 2014-07-24 LAB — CBC WITH DIFFERENTIAL/PLATELET
BASOS ABS: 0 10*3/uL (ref 0.0–0.1)
BASOS PCT: 0 % (ref 0–1)
EOS ABS: 0.1 10*3/uL (ref 0.0–0.7)
EOS PCT: 2 % (ref 0–5)
HEMATOCRIT: 46.3 % (ref 39.0–52.0)
HEMOGLOBIN: 15.7 g/dL (ref 13.0–17.0)
LYMPHS PCT: 31 % (ref 12–46)
Lymphs Abs: 1.9 10*3/uL (ref 0.7–4.0)
MCH: 31.3 pg (ref 26.0–34.0)
MCHC: 33.9 g/dL (ref 30.0–36.0)
MCV: 92.2 fL (ref 78.0–100.0)
MPV: 10.5 fL (ref 8.6–12.4)
Monocytes Absolute: 0.5 10*3/uL (ref 0.1–1.0)
Monocytes Relative: 9 % (ref 3–12)
Neutro Abs: 3.5 10*3/uL (ref 1.7–7.7)
Neutrophils Relative %: 58 % (ref 43–77)
PLATELETS: 239 10*3/uL (ref 150–400)
RBC: 5.02 MIL/uL (ref 4.22–5.81)
RDW: 13.8 % (ref 11.5–15.5)
WBC: 6.1 10*3/uL (ref 4.0–10.5)

## 2014-07-24 LAB — HEMOGLOBIN A1C
Hgb A1c MFr Bld: 5.9 % — ABNORMAL HIGH (ref <5.7)
Mean Plasma Glucose: 123 mg/dL — ABNORMAL HIGH (ref <117)

## 2014-07-24 LAB — LIPID PANEL
CHOLESTEROL: 178 mg/dL (ref 0–200)
HDL: 23 mg/dL — ABNORMAL LOW (ref >=40)
LDL Cholesterol: 127 mg/dL — ABNORMAL HIGH (ref 0–99)
Total CHOL/HDL Ratio: 7.7 Ratio
Triglycerides: 138 mg/dL (ref <150)
VLDL: 28 mg/dL (ref 0–40)

## 2014-07-24 LAB — VITAMIN D 25 HYDROXY (VIT D DEFICIENCY, FRACTURES): Vit D, 25-Hydroxy: 56 ng/mL (ref 30–100)

## 2014-07-24 LAB — INSULIN, RANDOM: Insulin: 19.7 u[IU]/mL — ABNORMAL HIGH (ref 2.0–19.6)

## 2014-07-24 LAB — TESTOSTERONE: Testosterone: 595 ng/dL (ref 300–890)

## 2014-07-24 LAB — IRON AND TIBC
%SAT: 47 % (ref 20–55)
Iron: 143 ug/dL (ref 42–165)
TIBC: 305 ug/dL (ref 215–435)
UIBC: 162 ug/dL (ref 125–400)

## 2014-07-24 LAB — VITAMIN B12: Vitamin B-12: 507 pg/mL (ref 211–911)

## 2014-07-24 LAB — PSA: PSA: 0.57 ng/mL (ref <=4.00)

## 2014-07-24 LAB — MAGNESIUM: MAGNESIUM: 2 mg/dL (ref 1.5–2.5)

## 2014-07-24 LAB — TSH: TSH: 1.945 u[IU]/mL (ref 0.350–4.500)

## 2014-07-30 LAB — TB SKIN TEST
Induration: 0 mm
TB Skin Test: NEGATIVE

## 2014-11-06 ENCOUNTER — Ambulatory Visit: Payer: Self-pay | Admitting: Internal Medicine

## 2015-02-03 ENCOUNTER — Other Ambulatory Visit: Payer: Self-pay | Admitting: Internal Medicine

## 2015-02-09 ENCOUNTER — Encounter: Payer: Self-pay | Admitting: Internal Medicine

## 2015-02-09 ENCOUNTER — Ambulatory Visit (INDEPENDENT_AMBULATORY_CARE_PROVIDER_SITE_OTHER): Payer: BLUE CROSS/BLUE SHIELD | Admitting: Internal Medicine

## 2015-02-09 VITALS — BP 126/84 | HR 64 | Temp 97.4°F | Resp 16 | Ht 71.5 in | Wt 230.6 lb

## 2015-02-09 DIAGNOSIS — M109 Gout, unspecified: Secondary | ICD-10-CM | POA: Insufficient documentation

## 2015-02-09 DIAGNOSIS — K219 Gastro-esophageal reflux disease without esophagitis: Secondary | ICD-10-CM

## 2015-02-09 DIAGNOSIS — E559 Vitamin D deficiency, unspecified: Secondary | ICD-10-CM | POA: Diagnosis not present

## 2015-02-09 DIAGNOSIS — J0141 Acute recurrent pansinusitis: Secondary | ICD-10-CM

## 2015-02-09 DIAGNOSIS — M1000 Idiopathic gout, unspecified site: Secondary | ICD-10-CM

## 2015-02-09 DIAGNOSIS — Z79899 Other long term (current) drug therapy: Secondary | ICD-10-CM

## 2015-02-09 DIAGNOSIS — Z23 Encounter for immunization: Secondary | ICD-10-CM

## 2015-02-09 DIAGNOSIS — R7303 Prediabetes: Secondary | ICD-10-CM | POA: Diagnosis not present

## 2015-02-09 DIAGNOSIS — I10 Essential (primary) hypertension: Secondary | ICD-10-CM | POA: Diagnosis not present

## 2015-02-09 DIAGNOSIS — Z6833 Body mass index (BMI) 33.0-33.9, adult: Secondary | ICD-10-CM

## 2015-02-09 DIAGNOSIS — E782 Mixed hyperlipidemia: Secondary | ICD-10-CM | POA: Diagnosis not present

## 2015-02-09 LAB — CBC WITH DIFFERENTIAL/PLATELET
BASOS PCT: 0 % (ref 0–1)
Basophils Absolute: 0 10*3/uL (ref 0.0–0.1)
EOS ABS: 0.4 10*3/uL (ref 0.0–0.7)
EOS PCT: 7 % — AB (ref 0–5)
HCT: 43.8 % (ref 39.0–52.0)
Hemoglobin: 14.8 g/dL (ref 13.0–17.0)
Lymphocytes Relative: 28 % (ref 12–46)
Lymphs Abs: 1.4 10*3/uL (ref 0.7–4.0)
MCH: 30.7 pg (ref 26.0–34.0)
MCHC: 33.8 g/dL (ref 30.0–36.0)
MCV: 90.9 fL (ref 78.0–100.0)
MPV: 10.5 fL (ref 8.6–12.4)
Monocytes Absolute: 0.5 10*3/uL (ref 0.1–1.0)
Monocytes Relative: 10 % (ref 3–12)
NEUTROS PCT: 55 % (ref 43–77)
Neutro Abs: 2.8 10*3/uL (ref 1.7–7.7)
PLATELETS: 203 10*3/uL (ref 150–400)
RBC: 4.82 MIL/uL (ref 4.22–5.81)
RDW: 13.1 % (ref 11.5–15.5)
WBC: 5 10*3/uL (ref 4.0–10.5)

## 2015-02-09 LAB — HEPATIC FUNCTION PANEL
ALBUMIN: 4.1 g/dL (ref 3.6–5.1)
ALT: 36 U/L (ref 9–46)
AST: 46 U/L — ABNORMAL HIGH (ref 10–35)
Alkaline Phosphatase: 47 U/L (ref 40–115)
BILIRUBIN DIRECT: 0.2 mg/dL (ref <=0.2)
BILIRUBIN TOTAL: 0.9 mg/dL (ref 0.2–1.2)
Indirect Bilirubin: 0.7 mg/dL (ref 0.2–1.2)
Total Protein: 6.9 g/dL (ref 6.1–8.1)

## 2015-02-09 LAB — BASIC METABOLIC PANEL WITH GFR
BUN: 14 mg/dL (ref 7–25)
CALCIUM: 9.5 mg/dL (ref 8.6–10.3)
CO2: 30 mmol/L (ref 20–31)
CREATININE: 0.92 mg/dL (ref 0.70–1.33)
Chloride: 99 mmol/L (ref 98–110)
GFR, Est African American: >89 mL/min (ref >=60)
GFR, Est Non African American: >89 mL/min (ref >=60)
Glucose, Bld: 111 mg/dL — ABNORMAL HIGH (ref 65–99)
Potassium: 4.6 mmol/L (ref 3.5–5.3)
SODIUM: 137 mmol/L (ref 135–146)

## 2015-02-09 LAB — LIPID PANEL
CHOL/HDL RATIO: 6.7 ratio — AB (ref <=5.0)
CHOLESTEROL: 175 mg/dL (ref 125–200)
HDL: 26 mg/dL — AB (ref >=40)
LDL Cholesterol: 128 mg/dL (ref <130)
TRIGLYCERIDES: 106 mg/dL (ref <150)
VLDL: 21 mg/dL (ref <30)

## 2015-02-09 LAB — TSH: TSH: 1.845 u[IU]/mL (ref 0.350–4.500)

## 2015-02-09 LAB — URIC ACID: URIC ACID, SERUM: 7.7 mg/dL (ref 4.0–7.8)

## 2015-02-09 LAB — HEMOGLOBIN A1C
Hgb A1c MFr Bld: 6 % — ABNORMAL HIGH (ref <5.7)
Mean Plasma Glucose: 126 mg/dL — ABNORMAL HIGH (ref <117)

## 2015-02-09 LAB — MAGNESIUM: MAGNESIUM: 1.8 mg/dL (ref 1.5–2.5)

## 2015-02-09 MED ORDER — PREDNISONE 20 MG PO TABS: ORAL_TABLET | ORAL | Status: DC

## 2015-02-09 MED ORDER — AZITHROMYCIN 250 MG PO TABS: ORAL_TABLET | ORAL | Status: DC

## 2015-02-09 NOTE — Progress Notes (Signed)
Patient ID: Sean Maldonado, male   DOB: 06-24-1955, 59 y.o.   MRN: [id number]   This very nice 59 y.o. SWM presents for 3 month follow up with Hypertension, Hyperlipidemia, Morbid Obesity, Pre-Diabetes and Vitamin D Deficiency.    Patient was initially dx'd with HTN in 2004 and was started on treatment in 2006 & BP has been controlled at home. Today's BP: 126/84 mmHg. Patient has had no complaints of any cardiac type chest pain, palpitations, dyspnea/orthopnea/PND, dizziness, claudication, or dependent edema.   Hyperlipidemia is not controlled with diet & he has  Been off of his zetia for 2-3 weeks due to co-pay recently recently going from $40 to $3000 fot ooo. Patient denies myalgias or other med SE's. Last Lipids were not at goal n Cholesterol 178; HDL 23*; LDL 127*; Triglycerides 138 on 07/23/2014.   Also, the patient has history of Morbid Obesity (BMI 33.3) and consewPreDiabetes and has had no symptoms of reactive hypoglycemia, diabetic polys, paresthesias or visual blurring.  Last A1c was  Hgb A1c MFr Bld 5.9% on 07/23/2014.    Further, the patient also has history of Vitamin D Deficiency of 42 on treatment and supplements vitamin D without any suspected side-effects. Last vitamin D was Vit D, 25-Hydroxy 56 on 07/23/2014.  Medication Sig  . aspirin 81 MG tablet Take 81 mg by mouth daily.  Marland Kitchen atenolol 100 MG tablet TAKE 1 TABLET DAILY FOR BLOOD PRESSURE.  Marland Kitchen VITAMIN D 5000 UNITS CAPS Take 1 capsule by mouth daily.  . fexofenadine (ALLEGRA) 180 MG tablet Take 180 mg by mouth daily as needed (allergies).  Marland Kitchen lisinopril-hctz 20-12.5 MG per tablet TAKE 1 TABLET DAILY FOR BLOOD PRESSURE.  . Multiple Vitamin  Take 1 capsule by mouth daily.  Marland Kitchen omeprazole  40 MG capsule Take 1 capsule (40 mg total) by mouth daily.  Marland Kitchen ZETIA 10 MG tablet TAKE ONE TABLET DAILY FOR CHOLESTEROL. - NOT TAKING  . azelastine (ASTELIN) 137  nasal spray Place 2 sprays into both nostrils 2 (two) times daily. Use in each nostril as  directed  . metFORMIN  XR  500 MG 24 hr tablet 1 to 4 pills daily after a meal as directed for Diabetes and Insulin Resistance (Patient not taking: Reported on 07/23/2014)   Allergies  Allergen Reactions  . Lipitor [Atorvastatin]     Myalgias   . Zocor [Simvastatin]     Myalgias   PMHx:   Past Medical History  Diagnosis Date  . Seasonal allergies   . Cholelithiasis   . Hepatic steatosis   . Obesity   . GERD (gastroesophageal reflux disease)   . Dysrhythmia     RBBB-incomplete  . Arthritis     pt denies  . Asthma   . Hypercholesteremia   . Hypertension   . Prediabetes   . Vitamin D deficiency   . Fatty liver    Immunization History  Administered Date(s) Administered  . Influenza Split 02/26/2013  . PPD Test 07/22/2013, 07/23/2014  . Pneumococcal Polysaccharide-23 05/22/2008  . Tdap 07/18/2012   Past Surgical History  Procedure Laterality Date  . Tonsillectomy    . Wisdom tooth extraction    . Axillary lymph node biopsy Right 08/27/2012    Procedure: AXILLARY LYMPH NODE BIOPSY ;  Surgeon: Rolm Bookbinder, MD;  Location: [facility];  Service: General;  Laterality: Right;  . Cholecystectomy N/A 12/28/2012    Procedure: LAPAROSCOPIC CHOLECYSTECTOMY WITH INTRAOPERATIVE CHOLANGIOGRAM;  Surgeon: Joyice Faster. Cornett, MD;  Location: [facility];  Service:  General;  Laterality: N/A;   FHx:    Reviewed / unchanged  SHx:    Reviewed / unchanged  Systems Review:  Constitutional: Denies fever, chills, wt changes, headaches, insomnia, fatigue, night sweats, change in appetite. Eyes: Denies redness, blurred vision, diplopia, discharge, itchy, watery eyes.  ENT: Denies epistaxis, sore throat, earache, hearing loss, dental pain, tinnitus, vertigo, (+) sinus pain, snoring.  C/o nasal discharge, congestion spost nasal drip,  CV: Denies chest pain, palpitations, irregular heartbeat, syncope, dyspnea, diaphoresis, orthopnea, PND, claudication or edema. Respiratory: denies cough, dyspnea, DOE,  pleurisy, hoarseness, laryngitis, wheezing.  Gastrointestinal: Denies dysphagia, odynophagia, heartburn, reflux, water brash, abdominal pain or cramps, nausea, vomiting, bloating, diarrhea, constipation, hematemesis, melena, hematochezia  or hemorrhoids. Genitourinary: Denies dysuria, frequency, urgency, nocturia, hesitancy, discharge, hematuria or flank pain. Musculoskeletal: Denies arthralgias, myalgias, stiffness, jt. swelling, pain, limping or strain/sprain.  Skin: Denies pruritus, rash, hives, warts, acne, eczema or change in skin lesion(s). Neuro: No weakness, tremor, incoordination, spasms, paresthesia or pain. Psychiatric: Denies confusion, memory loss or sensory loss. Endo: Denies change in weight, skin or hair change.  Heme/Lymph: No excessive bleeding, bruising or enlarged lymph nodes.  Physical Exam  BP 126/84 mmHg  Pulse 64  Temp(Src) 97.4 F (36.3 C)  Resp 16  Ht 5' 11.5" (1.816 m)  Wt 230 lb 9.6 oz (104.599 kg)  BMI 31.72 kg/m2  Appears well nourished and in no distress. Eyes: PERRLA, EOMs, conjunctiva no swelling or erythema. Sinuses: (+) frontal/maxillary tenderness ENT/Mouth: EAC's clear, TM's nl w/o erythema, bulging. Nares clear w/o erythema, swelling, exudates. Oropharynx clear without erythema or exudates. Oral hygiene is good. Tongue normal, non obstructing. Hearing intact.  Neck: Supple. Thyroid nl. Car 2+/2+ without bruits, nodes or JVD. Chest: Respirations nl with BS clear & equal w/o rales, rhonchi, wheezing or stridor.  Cor: Heart sounds normal w/ regular rate and rhythm without sig. murmurs, gallops, clicks, or rubs. Peripheral pulses normal and equal  without edema.  Abdomen: Soft & bowel sounds normal. Non-tender w/o guarding, rebound, hernias, masses, or organomegaly.  Lymphatics: Unremarkable.  Musculoskeletal: Full ROM all peripheral extremities, joint stability, 5/5 strength, and normal gait.  Skin: Warm, dry without exposed rashes, lesions or  ecchymosis apparent.  Neuro: Cranial nerves intact, reflexes equal bilaterally. Sensory-motor testing grossly intact. Tendon reflexes grossly intact.  Pysch: Alert & oriented x 3.  Insight and judgement nl & appropriate. No ideations.  Assessment and Plan:  1. Essential hypertension  - TSH  2. Hyperlipidemia  - Lipid panel  3. Prediabetes  - Hemoglobin A1c - Insulin, random  4. Vitamin D deficiency  - Vit D  25 hydroxy                      5. Gastroesophageal reflux disease, esophagitis presence not specified   6. Morbid obesity (BMI 33.37)   7. Acute idiopathic gout - Uric acid  8. Need for prophylactic vaccination and inoculation against influenza  - Flu vaccine > 3yo with preservative IM (Fluvirin Influenza Split)  9. Acute recurrent pansinusitis  - predniSONE (DELTASONE) 20 MG tablet; 1 tab 3 x day for 3 days, then 1 tab 2 x day for 3 days, then 1 tab 1 x day for 5 days  Dispense: 20 tablet; Refill: 0 - azithromycin (ZITHROMAX) 250 MG tablet; Take 2 tablets (500 mg) on  Day 1,  followed by 1 tablet (250 mg) once daily on Days 2 through 5.  Dispense: 6 each; Refill: 1  10. Medication  management  - CBC with Differential/Platelet - BASIC METABOLIC PANEL WITH GFR - Hepatic function panel - Magnesium  11. BMI 33.0-33.9,adult   Recommended regular exercise, BP monitoring, weight control, and discussed med and SE's. Recommended labs to assess and monitor clinical status. Further disposition pending results of labs. Over 30 minutes of exam, counseling, chart review was performed

## 2015-02-09 NOTE — Patient Instructions (Signed)
Recommend Adult Low dose Aspirin or   coated  Aspirin 81 mg daily   To reduce risk of Colon Cancer 20 %,   Skin Cancer 26 % ,   Melanoma 46%   and   Pancreatic cancer 60%  ++++++++++++++++++  Vitamin D goal   is between 70-100.   Please make sure that you are taking your Vitamin D as directed.   It is very important as a natural anti-inflammatory   helping hair, skin, and nails, as well as reducing stroke and heart attack risk.   It helps your bones and helps with mood.  It also decreases numerous cancer risks so please take it as directed.   Low Vit D is associated with a 200-300% higher risk for CANCER   and 200-300% higher risk for HEART   ATTACK  &  STROKE.   .....................................Marland Kitchen  It is also associated with higher death rate at younger ages,   autoimmune diseases like Rheumatoid arthritis, Lupus, Multiple Sclerosis.     Also many other serious conditions, like depression, Alzheimer's  Dementia, infertility, muscle aches, fatigue, fibromyalgia - just to name a few.  +++++++++++++++++++  Recommend the book "The END of DIETING" by Dr Excell Seltzer   & the book "The END of DIABETES " by Dr Excell Seltzer  At [email or web address] - get book & Audio CD's     Being diabetic has a  300% increased risk for heart attack, stroke, cancer, and alzheimer- type vascular dementia. It is very important that you work harder with diet by avoiding all foods that are white. Avoid white rice (brown & wild rice is OK), white potatoes (sweetpotatoes in moderation is OK), White bread or wheat bread or anything made out of white flour like bagels, donuts, rolls, buns, biscuits, cakes, pastries, cookies, pizza crust, and pasta (made from white flour & egg whites) - vegetarian pasta or spinach or wheat pasta is OK. Multigrain breads like Arnold's or Pepperidge Farm, or multigrain sandwich thins or flatbreads.  Diet, exercise and weight loss can reverse and cure diabetes in the early  stages.  Diet, exercise and weight loss is very important in the control and prevention of complications of diabetes which affects every system in your body, ie. Brain - dementia/stroke, eyes - glaucoma/blindness, heart - heart attack/heart failure, kidneys - dialysis, stomach - gastric paralysis, intestines - malabsorption, nerves - severe painful neuritis, circulation - gangrene & loss of a leg(s), and finally cancer and Alzheimers.    I recommend avoid fried & greasy foods,  sweets/candy, white rice (brown or wild rice or Quinoa is OK), white potatoes (sweet potatoes are OK) - anything made from white flour - bagels, doughnuts, rolls, buns, biscuits,white and wheat breads, pizza crust and traditional pasta made of white flour & egg white(vegetarian pasta or spinach or wheat pasta is OK).  Multi-grain bread is OK - like multi-grain flat bread or sandwich thins. Avoid alcohol in excess. Exercise is also important.    Eat all the vegetables you want - avoid meat, especially red meat and dairy - especially cheese.  Cheese is the most concentrated form of trans-fats which is the worst thing to clog up our arteries. Veggie cheese is OK which can be found in the fresh produce section at [facility] or [employer] or [employer]  ++++++++++++++++++++++++++  Fatty Liver Fatty liver is the accumulation of fat in liver cells. It is also called hepatosteatosis or steatohepatitis. It is normal for your liver to contain some fat. If  fat is more than 5 to 10% of your liver's weight, you have fatty liver.  There are often no symptoms (problems) for years while damage is still occurring. People often learn about their fatty liver when they have medical tests for other reasons. Fat can damage your liver for years or even decades without causing problems. When it becomes severe, it can cause fatigue, weight loss, weakness, and confusion. This makes you more likely to develop more serious liver problems. The liver is the  largest organ in the body. It does a lot of work and often gives no warning signs when it is sick until late in a disease. The liver has many important jobs including:  Breaking down foods.  Storing vitamins, iron, and other minerals.  Making proteins.  Making bile for food digestion.  Breaking down many products including medications, alcohol and some poisons. CAUSES  There are a number of different conditions, medications, and poisons that can cause a fatty liver. Eating too many calories causes fat to build up in the liver. Not processing and breaking fats down normally may also cause this. Certain conditions, such as obesity, diabetes, and high triglycerides also cause this. Most fatty liver patients tend to be middle-aged and over weight.  Some causes of fatty liver are:  Alcohol over consumption.  Malnutrition.  Steroid use.  Valproic acid toxicity.  Obesity.  Cushing's syndrome.  Poisons.  Tetracycline in high dosages.  Pregnancy.  Diabetes.  Hyperlipidemia.  Rapid weight loss. Some people develop fatty liver even having none of these conditions. SYMPTOMS  Fatty liver most often causes no problems. This is called asymptomatic.  It can be diagnosed with blood tests and also by a liver biopsy.  It is one of the most common causes of minor elevations of liver enzymes on routine blood tests.  Specialized Imaging of the liver using ultrasound, CT (computed tomography) scan, or MRI (magnetic resonance imaging) can suggest a fatty liver but a biopsy is needed to confirm it.  A biopsy involves taking a small sample of liver tissue. This is done by using a needle. It is then looked at under a microscope by a specialist. TREATMENT  It is important to treat the cause. Simple fatty liver without a medical reason may not need treatment.  Weight loss, fat restriction, and exercise in overweight patients produces inconsistent results but is worth trying.  Fatty liver  due to alcohol toxicity may not improve even with stopping drinking.  Good control of diabetes may reduce fatty liver.  Lower your triglycerides through diet, medication or both.  Eat a balanced, healthy diet.  Increase your physical activity.  Get regular checkups from a liver specialist.  There are no medical or surgical treatments for a fatty liver or NASH, but improving your diet and increasing your exercise may help prevent or reverse some of the damage. PROGNOSIS  Fatty liver may cause no damage or it can lead to an inflammation of the liver. This is, called steatohepatitis. When it is linked to alcohol abuse, it is called alcoholic steatohepatitis. It often is not linked to alcohol. It is then called nonalcoholic steatohepatitis, or NASH. Over time the liver may become scarred and hardened. This condition is called cirrhosis. Cirrhosis is serious and may lead to liver failure or cancer. NASH is one of the leading causes of cirrhosis. About 10-20% of Americans have fatty liver and a smaller 2-5% has NASH. Document Released: 06/10/2005 Document Revised: 07/18/2011 Document Reviewed: 09/04/2013 [employer] Patient Information  2015 [employer]. This information is not intended to replace advice given to you by your health care provider. Make sure you discuss any questions you have with your health care provider.  

## 2015-02-10 LAB — VITAMIN D 25 HYDROXY (VIT D DEFICIENCY, FRACTURES): VIT D 25 HYDROXY: 61 ng/mL (ref 30–100)

## 2015-02-10 LAB — INSULIN, RANDOM: INSULIN: 22.9 u[IU]/mL — AB (ref 2.0–19.6)

## 2015-04-27 ENCOUNTER — Other Ambulatory Visit: Payer: Self-pay | Admitting: Internal Medicine

## 2015-05-19 ENCOUNTER — Ambulatory Visit (INDEPENDENT_AMBULATORY_CARE_PROVIDER_SITE_OTHER): Payer: BLUE CROSS/BLUE SHIELD | Admitting: Internal Medicine

## 2015-05-19 VITALS — BP 130/80 | HR 74 | Temp 98.6°F | Resp 18 | Ht 71.5 in | Wt 240.0 lb

## 2015-05-19 DIAGNOSIS — R21 Rash and other nonspecific skin eruption: Secondary | ICD-10-CM | POA: Diagnosis not present

## 2015-05-19 MED ORDER — PREDNISONE 20 MG PO TABS: ORAL_TABLET | ORAL | Status: DC

## 2015-05-19 MED ORDER — TRIAMCINOLONE ACETONIDE 0.1 % EX CREA: 1.0000 "application " | TOPICAL_CREAM | Freq: Three times a day (TID) | CUTANEOUS | Status: DC

## 2015-05-19 MED ORDER — DOXYCYCLINE HYCLATE 100 MG PO CAPS: 100.0000 mg | ORAL_CAPSULE | Freq: Two times a day (BID) | ORAL | Status: DC

## 2015-05-19 NOTE — Progress Notes (Signed)
   Subjective:    Patient ID: Sean Maldonado, male    DOB: [birth year], 60 y.o.   MRN: [id number]  Rash This is a new problem. The current episode started more than 1 month ago. Pertinent negatives include no fatigue, fever or vomiting.   Patient reports that for the past month he has had a worsening rash on the top of his head, his hands, and his chest.  He reports that he has tried hydrocortisone, oil of oregano, tried special eczema cream, also tried using a humidifier.  He has a history of eczema.  He reports that he is now been using a natural soap now.  He feels like he recently had work done to his heat and this is what is causing his skin to get dried out.  He reports now the rash is itchy.  It is now mildly painful on his chest.    Review of Systems  Constitutional: Negative for fever, chills and fatigue.  Gastrointestinal: Negative for nausea and vomiting.  Skin: Positive for rash.       Objective:   Physical Exam  Constitutional: He is oriented to person, place, and time. He appears well-developed and well-nourished. No distress.  HENT:  Head: Normocephalic.    Mouth/Throat: Oropharynx is clear and moist. No oropharyngeal exudate.  Maculopapular rash to the bilateral hands, feet and to the chest.  There is extensive excoriation, scabbing and redness to the chest.  Non-tender to palpation.  Without drainage or discharge.    Eyes: Conjunctivae are normal. No scleral icterus.  Neck: Normal range of motion. Neck supple. No JVD present. No thyromegaly present.  Cardiovascular: Normal rate, regular rhythm, normal heart sounds and intact distal pulses.  Exam reveals no gallop and no friction rub.   No murmur heard. Pulmonary/Chest: Effort normal and breath sounds normal. No respiratory distress. He has no wheezes. He has no rales. He exhibits no tenderness.  Abdominal: Soft. Bowel sounds are normal. He exhibits no distension and no mass. There is no tenderness. There is no rebound and  no guarding.  Musculoskeletal: Normal range of motion.  Lymphadenopathy:    He has no cervical adenopathy.  Neurological: He is alert and oriented to person, place, and time.  Skin: Skin is warm and dry. Rash noted. He is not diaphoretic.  Psychiatric: He has a normal mood and affect. His behavior is normal. Judgment and thought content normal.  Nursing note and vitals reviewed.   Filed Vitals:   05/19/15 1354  BP: 130/80  Pulse: 74  Temp: 98.6 F (37 C)  Resp: 18         Assessment & Plan:    1. Rash and nonspecific skin eruption -doxycycline -prednisone -kenalog

## 2015-07-07 ENCOUNTER — Ambulatory Visit (INDEPENDENT_AMBULATORY_CARE_PROVIDER_SITE_OTHER): Payer: BLUE CROSS/BLUE SHIELD | Admitting: Internal Medicine

## 2015-07-07 ENCOUNTER — Encounter: Payer: Self-pay | Admitting: Internal Medicine

## 2015-07-07 VITALS — BP 118/80 | HR 60 | Temp 97.7°F | Resp 16 | Ht 71.5 in | Wt 222.4 lb

## 2015-07-07 DIAGNOSIS — J029 Acute pharyngitis, unspecified: Secondary | ICD-10-CM | POA: Diagnosis not present

## 2015-07-07 DIAGNOSIS — L259 Unspecified contact dermatitis, unspecified cause: Secondary | ICD-10-CM

## 2015-07-07 MED ORDER — PROMETHAZINE-DM 6.25-15 MG/5ML PO SYRP: ORAL_SOLUTION | ORAL | Status: DC

## 2015-07-07 MED ORDER — PREDNISONE 20 MG PO TABS: ORAL_TABLET | ORAL | Status: DC

## 2015-07-07 MED ORDER — AZITHROMYCIN 250 MG PO TABS: ORAL_TABLET | ORAL | Status: DC

## 2015-07-07 NOTE — Progress Notes (Signed)
Subjective:    Patient ID: Sean Maldonado, male    DOB: 13-Dec-1955, 60 y.o.   MRN: [id number]  HPI   Patient was treated ~ 1 month ago with a vague eczematoid type rash with a pulse prednisone taper which improved and now returns with c/o of a severe sore throat and reports rash of trunk and extreme ties was exacerbated. C/o pain swallowing, but denies fever, chills, sweats or congestion otherwise.   Medication Sig  . aspirin 81 MG tablet Take 81 mg by mouth daily.  Marland Kitchen atenolol (TENORMIN) 100 MG tablet TAKE 1 TABLET DAILY FOR BLOOD PRESSURE.  Marland Kitchen Cholecalciferol (VITAMIN D3) 5000 UNITS  Take 1 capsule by mouth daily.  . fexofenadine (ALLEGRA) 180 MG tablet Take 180 mg by mouth daily as needed (allergies).  Marland Kitchen lisinopril-hydrochlorothiazide (PRINZIDE,ZESTORETIC) 20-12.5 MG tablet TAKE 1 TABLET DAILY FOR BLOOD PRESSURE.  . Multiple Vitamin (MULTIVITAMIN) capsule Take 1 capsule by mouth daily.  Marland Kitchen omeprazole (PRILOSEC) 40 MG capsule TAKE 1 CAPSULE DAILY.  Marland Kitchen triamcinolone cream (KENALOG) 0.1 % Apply 1 application topically 3 (three) times daily.  Marland Kitchen doxycycline (VIBRAMYCIN) 100 MG capsule Take 1 capsule (100 mg total) by mouth 2 (two) times daily. One po bid x 7 days  . predniSONE (DELTASONE) 20 MG tablet 3 tabs po daily x 3 days, then 2 tabs x 3 days, then 1.5 tabs x 3 days, then 1 tab x 3 days, then 0.5 tabs x 3 days   Allergies  Allergen Reactions  . Lipitor [Atorvastatin]     Myalgias   . Zocor [Simvastatin]     Myalgias   Past Medical History  Diagnosis Date  . Seasonal allergies   . Cholelithiasis   . Hepatic steatosis   . Obesity   . GERD (gastroesophageal reflux disease)   . Dysrhythmia     RBBB-incomplete  . Arthritis     pt denies  . Asthma   . Hypercholesteremia   . Hypertension   . Prediabetes   . Vitamin D deficiency   . Fatty liver    Past Surgical History  Procedure Laterality Date  . Tonsillectomy    . Wisdom tooth extraction    . Axillary lymph node biopsy Right  08/27/2012    Procedure: AXILLARY LYMPH NODE BIOPSY ;  Surgeon: Rolm Bookbinder, MD;  Location: [facility];  Service: General;  Laterality: Right;  . Cholecystectomy N/A 12/28/2012    Procedure: LAPAROSCOPIC CHOLECYSTECTOMY WITH INTRAOPERATIVE CHOLANGIOGRAM;  Surgeon: Joyice Faster. Cornett, MD;  Location: [facility];  Service: General;  Laterality: N/A;   Review of Systems  10 point systems review negative except as above.    Objective:   Physical Exam  BP 118/80 mmHg  Pulse 60  Temp(Src) 97.7 F (36.5 C)  Resp 16  Ht 5' 11.5" (1.816 m)  Wt 222 lb 6.4 oz (100.88 kg)  BMI 30.59 kg/m2  In no respiratory distress.   HEENT - Eac's patent. TM's Nl. EOM's full. PERRLA. NasoOroPharynx clear 3 (+) injected with scattered areas of "raw" appearing mucositis over the hard & soft palate and buccal mucosa. No exudates are evident. Tonsils are absent or atrophic.  Neck - supple. Nl Thyroid. Carotids 2+ & No bruits or JVD, but there are tender anterior Cx LN bilaterally. Chest - Clear equal BS w/o Rales, rhonchi, wheezes. Cor - Nl HS. RRR w/o sig MGR. PP 1(+). No edema. Abd - No palpable organomegaly, masses or tenderness. BS nl. MS- FROM w/o deformities. Muscle power, tone and bulk  Nl. Gait Nl. Neuro - No obvious Cr N abnormalities. Sensory, motor and Cerebellar functions appear Nl w/o focal abnormalities. Psyche - Mental status normal & appropriate.  No delusions, ideations or obvious mood abnormalities. Skin - scattered patches of dry scaly and pink eczematoid type rash over the trunk and extremities.     Assessment & Plan:   1. Viral pharyngitis  - azithromycin (ZITHROMAX) 250 MG tablet; Take 2 tablets (500 mg) on  Day 1,  followed by 1 tablet (250 mg) once daily on Days 2 through 5.  Dispense: 6 each; Refill: 1 - promethazine-dextromethorphan (PROMETHAZINE-DM) 6.25-15 MG/5ML syrup; Take 1 to 2 tsp enery 4 hours if needed for cough  Dispense: 360 mL; Refill: 1  2. Contact dermatitis and eczema  -  Patient was advised to stop using Tide detergent and to re-wash all clothes and bed linens & only use [employer] or [employer] and to double rinse laundry. Also advised to use Dove, Tone or Caress hand soaps.   - predniSONE (DELTASONE) 20 MG tablet; 1 tab 3 x day for 3 days,    then 1 tab 2 x day for 3 days,    then 1 tab 1 x day for 5 days,    then 1 tablet every other day  Dispense: 30 tablet; Refill: 0

## 2015-07-07 NOTE — Patient Instructions (Signed)

## 2015-07-24 ENCOUNTER — Ambulatory Visit (INDEPENDENT_AMBULATORY_CARE_PROVIDER_SITE_OTHER): Payer: BLUE CROSS/BLUE SHIELD | Admitting: Internal Medicine

## 2015-07-24 ENCOUNTER — Encounter: Payer: Self-pay | Admitting: Internal Medicine

## 2015-07-24 VITALS — BP 122/78 | HR 72 | Temp 97.7°F | Resp 16 | Ht 71.75 in | Wt 224.6 lb

## 2015-07-24 DIAGNOSIS — Z79899 Other long term (current) drug therapy: Secondary | ICD-10-CM | POA: Diagnosis not present

## 2015-07-24 DIAGNOSIS — Z125 Encounter for screening for malignant neoplasm of prostate: Secondary | ICD-10-CM | POA: Diagnosis not present

## 2015-07-24 DIAGNOSIS — Z111 Encounter for screening for respiratory tuberculosis: Secondary | ICD-10-CM | POA: Diagnosis not present

## 2015-07-24 DIAGNOSIS — R7303 Prediabetes: Secondary | ICD-10-CM

## 2015-07-24 DIAGNOSIS — K219 Gastro-esophageal reflux disease without esophagitis: Secondary | ICD-10-CM

## 2015-07-24 DIAGNOSIS — Z0001 Encounter for general adult medical examination with abnormal findings: Secondary | ICD-10-CM

## 2015-07-24 DIAGNOSIS — I10 Essential (primary) hypertension: Secondary | ICD-10-CM | POA: Diagnosis not present

## 2015-07-24 DIAGNOSIS — K7581 Nonalcoholic steatohepatitis (NASH): Secondary | ICD-10-CM

## 2015-07-24 DIAGNOSIS — Z1212 Encounter for screening for malignant neoplasm of rectum: Secondary | ICD-10-CM

## 2015-07-24 DIAGNOSIS — Z0000 Encounter for general adult medical examination without abnormal findings: Secondary | ICD-10-CM

## 2015-07-24 DIAGNOSIS — J029 Acute pharyngitis, unspecified: Secondary | ICD-10-CM

## 2015-07-24 DIAGNOSIS — R5383 Other fatigue: Secondary | ICD-10-CM

## 2015-07-24 DIAGNOSIS — E559 Vitamin D deficiency, unspecified: Secondary | ICD-10-CM | POA: Diagnosis not present

## 2015-07-24 DIAGNOSIS — E782 Mixed hyperlipidemia: Secondary | ICD-10-CM

## 2015-07-24 LAB — BASIC METABOLIC PANEL WITH GFR
BUN: 12 mg/dL (ref 7–25)
CHLORIDE: 98 mmol/L (ref 98–110)
CO2: 31 mmol/L (ref 20–31)
CREATININE: 0.88 mg/dL (ref 0.70–1.33)
Calcium: 8.8 mg/dL (ref 8.6–10.3)
GFR, Est African American: >89 mL/min (ref >=60)
GFR, Est Non African American: >89 mL/min (ref >=60)
Glucose, Bld: 96 mg/dL (ref 65–99)
POTASSIUM: 4.1 mmol/L (ref 3.5–5.3)
SODIUM: 137 mmol/L (ref 135–146)

## 2015-07-24 LAB — LIPID PANEL
CHOL/HDL RATIO: 7.4 ratio — AB (ref <=5.0)
Cholesterol: 192 mg/dL (ref 125–200)
HDL: 26 mg/dL — AB (ref >=40)
LDL CALC: 135 mg/dL — AB (ref <130)
Triglycerides: 155 mg/dL — ABNORMAL HIGH (ref <150)
VLDL: 31 mg/dL — AB (ref <30)

## 2015-07-24 LAB — CBC WITH DIFFERENTIAL/PLATELET
BASOS ABS: 0 10*3/uL (ref 0.0–0.1)
BASOS PCT: 0 % (ref 0–1)
Eosinophils Absolute: 0 10*3/uL (ref 0.0–0.7)
Eosinophils Relative: 1 % (ref 0–5)
HCT: 38.7 % — ABNORMAL LOW (ref 39.0–52.0)
HEMOGLOBIN: 12.9 g/dL — AB (ref 13.0–17.0)
LYMPHS ABS: 0.8 10*3/uL (ref 0.7–4.0)
Lymphocytes Relative: 18 % (ref 12–46)
MCH: 29.9 pg (ref 26.0–34.0)
MCHC: 33.3 g/dL (ref 30.0–36.0)
MCV: 89.6 fL (ref 78.0–100.0)
MONOS PCT: 9 % (ref 3–12)
MPV: 10.4 fL (ref 8.6–12.4)
Monocytes Absolute: 0.4 10*3/uL (ref 0.1–1.0)
NEUTROS ABS: 3.4 10*3/uL (ref 1.7–7.7)
NEUTROS PCT: 72 % (ref 43–77)
PLATELETS: 170 10*3/uL (ref 150–400)
RBC: 4.32 MIL/uL (ref 4.22–5.81)
RDW: 14.3 % (ref 11.5–15.5)
WBC: 4.7 10*3/uL (ref 4.0–10.5)

## 2015-07-24 LAB — HEPATIC FUNCTION PANEL
ALK PHOS: 58 U/L (ref 40–115)
ALT: 48 U/L — AB (ref 9–46)
AST: 54 U/L — AB (ref 10–35)
Albumin: 3.6 g/dL (ref 3.6–5.1)
BILIRUBIN DIRECT: 0.2 mg/dL (ref <=0.2)
BILIRUBIN INDIRECT: 0.6 mg/dL (ref 0.2–1.2)
BILIRUBIN TOTAL: 0.8 mg/dL (ref 0.2–1.2)
Total Protein: 7.2 g/dL (ref 6.1–8.1)

## 2015-07-24 LAB — IRON AND TIBC
%SAT: 42 % (ref 15–60)
IRON: 103 ug/dL (ref 50–180)
TIBC: 245 ug/dL — ABNORMAL LOW (ref 250–425)
UIBC: 142 ug/dL (ref 125–400)

## 2015-07-24 LAB — HEMOGLOBIN A1C
HEMOGLOBIN A1C: 6 % — AB (ref <5.7)
Mean Plasma Glucose: 126 mg/dL — ABNORMAL HIGH (ref <117)

## 2015-07-24 LAB — TSH: TSH: 1.6 m[IU]/L (ref 0.40–4.50)

## 2015-07-24 LAB — TESTOSTERONE: Testosterone: 411 ng/dL (ref 250–827)

## 2015-07-24 LAB — VITAMIN B12: VITAMIN B 12: 405 pg/mL (ref 200–1100)

## 2015-07-24 LAB — MAGNESIUM: MAGNESIUM: 1.8 mg/dL (ref 1.5–2.5)

## 2015-07-24 MED ORDER — TRIAMCINOLONE ACETONIDE 0.1 % MT PSTE: 1.0000 "application " | PASTE | Freq: Two times a day (BID) | OROMUCOSAL | Status: DC

## 2015-07-24 MED ORDER — VALACYCLOVIR HCL 1 G PO TABS: ORAL_TABLET | ORAL | Status: AC

## 2015-07-24 NOTE — Progress Notes (Signed)
Patient ID: Sean Maldonado, male   DOB: 1956-02-12, 60 y.o.   MRN: [id number]  Annual  Screening/Preventative Visit And Comprehensive Evaluation & Examination  This very nice 60 y.o. SWM presents for a Wellness/Preventative Visit & comprehensive evaluation and management of multiple medical co-morbidities.  Patient has been followed for HTN, Prediabetes, Hyperlipidemia and Vitamin D Deficiency. Other problems include NASH. Has GERD controlled with meds. Patient recently treated for a viral pharyngitis and still has sequelae of oral stomatitis.    HTN predates since 2004 altho treatment wasn't started until 2008. Patient's BP has been controlled at home.Today's BP: 122/78 mmHg. Patient denies any cardiac symptoms as chest pain, palpitations, shortness of breath, dizziness or ankle swelling.   Patient's hyperlipidemia is not controlled with diet and medications (lipitor intolerant). Patient denies myalgias or other medication SE's. Last lipids were not at goal with Cholesterol 175; HDL 26*; LDL 128; and Triglycerides 106 on 02/09/2015.    Patient has Morbid Obesity (BMI 30+) and consequent prediabetes since 2011 with A1c 5.9% & 6.0% in 2012 and patient denies reactive hypoglycemic symptoms, visual blurring, diabetic polys or paresthesias. Last A1c was 6.0% on10/07/2014.   Finally, patient has history of Vitamin D Deficiency of of "38" on treatment in 2008 and last vitamin D was 61 on 02/09/2015.  Medication Sig  . aspirin 81 MG tablet Take 81 mg by mouth daily.  Marland Kitchen atenolol 100 MG tablet TAKE 1 TABLET DAILY FOR BLOOD PRESSURE.  Marland Kitchen VITAMIN D 5000 UNITS  Take 1 capsule by mouth daily.  . fexofenadine  180 MG tablet Take 180 mg by mouth daily as needed (allergies).  Marland Kitchen lisinopril-hctz  20-12.5  TAKE 1 TABLET DAILY FOR BLOOD PRESSURE.  . Multiple Vitamin  Take 1 capsule by mouth daily.  Marland Kitchen omeprazole  40 MG capsule TAKE 1 CAPSULE DAILY.  Marland Kitchen PROMETHAZINE-DM  Take 1 to 2 tsp enery 4 hours if needed for cough  .  triamcinolone cream  0.1 % Apply 1 application topically 3 (three) times daily.   Allergies  Allergen Reactions  . Lipitor [Atorvastatin]     Myalgias   . Zocor [Simvastatin]     Myalgias   Past Medical History  Diagnosis Date  . Seasonal allergies   . Cholelithiasis   . Hepatic steatosis   . Obesity   . GERD (gastroesophageal reflux disease)   . Dysrhythmia     RBBB-incomplete  . Arthritis     pt denies  . Asthma   . Hypercholesteremia   . Hypertension   . Prediabetes   . Vitamin D deficiency   . Fatty liver    Health Maintenance  Topic Date Due  . INFLUENZA VACCINE  12/08/2015  . COLONOSCOPY  07/31/2021  . TETANUS/TDAP  07/19/2022  . Hepatitis C Screening  Completed  . HIV Screening  Completed   Immunization History  Administered Date(s) Administered  . Influenza Split 02/26/2013, 02/09/2015  . PPD Test 07/22/2013, 07/23/2014, 07/24/2015  . Pneumococcal Polysaccharide-23 05/22/2008  . Tdap 07/18/2012   Past Surgical History  Procedure Laterality Date  . Tonsillectomy    . Wisdom tooth extraction    . Axillary lymph node biopsy Right 08/27/2012    Procedure: AXILLARY LYMPH NODE BIOPSY ;  Surgeon: Rolm Bookbinder, MD;  Location: [facility];  Service: General;  Laterality: Right;  . Cholecystectomy N/A 12/28/2012    Procedure: LAPAROSCOPIC CHOLECYSTECTOMY WITH INTRAOPERATIVE CHOLANGIOGRAM;  Surgeon: Joyice Faster. Cornett, MD;  Location: [facility];  Service: General;  Laterality: N/A;  Family History  Problem Relation Age of Onset  . Diabetes Mother   . Heart disease Mother   . Colon polyps Neg Hx   . Colon cancer Neg Hx    Social History   Social History  . Marital Status: Single    Spouse Name: N/A  . Number of Children: 0  . Years of Education: N/A   Occupational History  . [employer]- Education officer, community Other   Social History Main Topics  . Smoking status: Never Smoker   . Smokeless tobacco: Never Used  . Alcohol Use: Yes     Comment:  occasional  . Drug Use: No  . Sexual Activity: Active    ROS Constitutional: Denies fever, chills, weight loss/gain, headaches, insomnia,  night sweats or change in appetite. Does c/o fatigue. Eyes: Denies redness, blurred vision, diplopia, discharge, itchy or watery eyes.  ENT: Denies discharge, congestion, post nasal drip, epistaxis, sore throat, earache, hearing loss, dental pain, Tinnitus, Vertigo, Sinus pain or snoring.  Cardio: Denies chest pain, palpitations, irregular heartbeat, syncope, dyspnea, diaphoresis, orthopnea, PND, claudication or edema Respiratory: denies cough, dyspnea, DOE, pleurisy, hoarseness, laryngitis or wheezing.  Gastrointestinal: Denies dysphagia, heartburn, reflux, water brash, pain, cramps, nausea, vomiting, bloating, diarrhea, constipation, hematemesis, melena, hematochezia, jaundice or hemorrhoids Genitourinary: Denies dysuria, frequency, urgency, nocturia, hesitancy, discharge, hematuria or flank pain Musculoskeletal: Denies arthralgia, myalgia, stiffness, Jt. Swelling, pain, limp or strain/sprain. Denies Falls. Skin: Denies puritis, rash, hives, warts, acne, eczema or change in skin lesion Neuro: No weakness, tremor, incoordination, spasms, paresthesia or pain Psychiatric: Denies confusion, memory loss or sensory loss. Denies Depression. Endocrine: Denies change in weight, skin, hair change, nocturia, and paresthesia, diabetic polys, visual blurring or hyper / hypo glycemic episodes.  Heme/Lymph: No excessive bleeding, bruising or enlarged lymph nodes.  Physical Exam  BP 122/78 mmHg  Pulse 72  Temp(Src) 97.7 F (36.5 C)  Resp 16  Ht 5' 11.75" (1.822 m)  Wt 224 lb 9.6 oz (101.878 kg)  BMI 30.69 kg/m2  General Appearance: Well nourished, in no apparent distress. Eyes: PERRLA, EOMs, conjunctiva no swelling or erythema, normal fundi and vessels. Sinuses: No frontal/maxillary tenderness ENT/Mouth: EACs patent / TMs  nl. Nares clear without erythema,  swelling, mucoid exudates. Oral hygiene is good. No erythema, swelling, or exudate. Tongue normal, non-obstructing. Tonsils not swollen or erythematous. Hearing normal. 2 dry healing ulcerations of the lower lip. Neck: Supple, thyroid normal. No bruits, nodes or JVD. Respiratory: Respiratory effort normal.  BS equal and clear bilateral without rales, rhonci, wheezing or stridor. Cardio: Heart sounds are normal with regular rate and rhythm and no murmurs, rubs or gallops. Peripheral pulses are normal and equal bilaterally without edema. No aortic or femoral bruits. Chest: symmetric with normal excursions and percussion.  Abdomen: Soft, with Nl bowel sounds. Nontender, no guarding, rebound, hernias, masses, or organomegaly.  Lymphatics: Non tender without lymphadenopathy.  Genitourinary: No hernias.Testes nl. DRE - prostate nl for age - smooth & firm w/o nodules. Musculoskeletal: Full ROM all peripheral extremities, joint stability, 5/5 strength, and normal gait. Skin: Warm and dry without rashes, lesions, cyanosis, clubbing or  ecchymosis.  Neuro: Cranial nerves intact, reflexes equal bilaterally. Normal muscle tone, no cerebellar symptoms. Sensation intact.  Pysch: Alert and oriented X 3 with normal affect, insight and judgment appropriate.   Assessment and Plan  1. Annual Preventative/Screening Exam    2. Essential hypertension  - Microalbumin / creatinine urine ratio - EKG 12-Lead - [place], RETROPERITNL ABD,  LTD - TSH  3. Hyperlipidemia  - Lipid panel - TSH  4. Prediabetes  - Hemoglobin A1c - Insulin, random  5. Vitamin D deficiency  - VITAMIN D 25 Hydroxy   6. Gastroesophageal reflux disease   7. NASH (nonalcoholic steatohepatitis)   8. Morbid obesity ([place])   9. Screening for rectal cancer  - POC Hemoccult Bld/Stl   10. Prostate cancer screening  - PSA  11. Other fatigue  - Vitamin B12 - Iron and TIBC - Testosterone - CBC with Differential/Platelet -  TSH  12. Viral pharyngitis  - valACYclovir (VALTREX) 1000 MG tablet; Take 1 tablet 3 x day for 10 days  Dispense: 30 tablet; Refill: 0 - triamcinolone (KENALOG) 0.1 % paste; Use as directed 1 application in the mouth or throat 2 (two) times daily. Apply to mouth/lip ulcers 4 x day  Dispense: 5 g; Refill: 3  13. Screening examination for pulmonary tuberculosis  - PPD  14. Medication management  - Urinalysis, Routine w reflex microscopic  - CBC with Differential/Platelet - BASIC METABOLIC PANEL WITH GFR - Hepatic function panel - Magnesium    Continue prudent diet as discussed, weight control, BP monitoring, regular exercise, and medications as discussed.  Discussed med effects and SE's. Routine screening labs and tests as requested with regular follow-up as recommended. Over 40 minutes of exam, counseling, chart review and high complex critical decision making was performed

## 2015-07-24 NOTE — Patient Instructions (Signed)

## 2015-07-25 LAB — URINALYSIS, ROUTINE W REFLEX MICROSCOPIC
BILIRUBIN URINE: NEGATIVE
Glucose, UA: NEGATIVE
HGB URINE DIPSTICK: NEGATIVE
KETONES UR: NEGATIVE
Leukocytes, UA: NEGATIVE
NITRITE: NEGATIVE
PROTEIN: NEGATIVE
SPECIFIC GRAVITY, URINE: 1.017 (ref 1.001–1.035)
pH: 7 (ref 5.0–8.0)

## 2015-07-25 LAB — PSA: PSA: 0.53 ng/mL (ref <=4.00)

## 2015-07-25 LAB — MICROALBUMIN / CREATININE URINE RATIO
CREATININE, URINE: 171 mg/dL (ref 20–370)
MICROALB UR: 1.4 mg/dL
MICROALB/CREAT RATIO: 8 ug/mg{creat} (ref <30)

## 2015-07-25 LAB — VITAMIN D 25 HYDROXY (VIT D DEFICIENCY, FRACTURES): VIT D 25 HYDROXY: 70 ng/mL (ref 30–100)

## 2015-07-26 ENCOUNTER — Other Ambulatory Visit: Payer: Self-pay | Admitting: Internal Medicine

## 2015-07-26 DIAGNOSIS — K760 Fatty (change of) liver, not elsewhere classified: Secondary | ICD-10-CM

## 2015-07-26 DIAGNOSIS — R945 Abnormal results of liver function studies: Secondary | ICD-10-CM

## 2015-07-26 DIAGNOSIS — R7989 Other specified abnormal findings of blood chemistry: Secondary | ICD-10-CM

## 2015-07-26 DIAGNOSIS — E785 Hyperlipidemia, unspecified: Secondary | ICD-10-CM

## 2015-07-26 MED ORDER — EZETIMIBE 10 MG PO TABS: ORAL_TABLET | ORAL | Status: DC

## 2015-07-27 LAB — INSULIN, RANDOM: Insulin: 14.6 u[IU]/mL (ref 2.0–19.6)

## 2015-07-30 ENCOUNTER — Telehealth: Payer: Self-pay | Admitting: *Deleted

## 2015-07-30 ENCOUNTER — Other Ambulatory Visit: Payer: Self-pay | Admitting: Internal Medicine

## 2015-07-30 DIAGNOSIS — R21 Rash and other nonspecific skin eruption: Secondary | ICD-10-CM

## 2015-07-30 MED ORDER — PREDNISONE 20 MG PO TABS: ORAL_TABLET | ORAL | Status: DC

## 2015-07-30 NOTE — Telephone Encounter (Signed)
Patient called and requested a med for his rash.  Per Dr Jones Broom, he sent in an RX for Prednisone and if it dose not clear up, he needs to see his dermatologist.  Patient states he is going to call [facility] to schedule an appointment.

## 2015-08-03 ENCOUNTER — Other Ambulatory Visit: Payer: Self-pay

## 2015-08-05 ENCOUNTER — Ambulatory Visit
Admission: RE | Admit: 2015-08-05 | Discharge: 2015-08-05 | Disposition: A | Discharge status: Home / Self Care | Payer: BLUE CROSS/BLUE SHIELD | Source: Ambulatory Visit | Attending: Internal Medicine | Admitting: Internal Medicine

## 2015-08-05 DIAGNOSIS — R7989 Other specified abnormal findings of blood chemistry: Secondary | ICD-10-CM

## 2015-08-05 DIAGNOSIS — K760 Fatty (change of) liver, not elsewhere classified: Secondary | ICD-10-CM

## 2015-08-05 DIAGNOSIS — R945 Abnormal results of liver function studies: Secondary | ICD-10-CM

## 2015-08-07 ENCOUNTER — Encounter: Payer: Self-pay | Admitting: Internal Medicine

## 2015-08-10 DIAGNOSIS — L300 Nummular dermatitis: Secondary | ICD-10-CM | POA: Diagnosis not present

## 2015-08-10 DIAGNOSIS — Z79899 Other long term (current) drug therapy: Secondary | ICD-10-CM | POA: Diagnosis not present

## 2015-08-10 DIAGNOSIS — L2089 Other atopic dermatitis: Secondary | ICD-10-CM | POA: Diagnosis not present

## 2015-08-13 DIAGNOSIS — Z79899 Other long term (current) drug therapy: Secondary | ICD-10-CM | POA: Diagnosis not present

## 2015-08-27 ENCOUNTER — Other Ambulatory Visit: Payer: Self-pay | Admitting: Internal Medicine

## 2015-08-31 ENCOUNTER — Other Ambulatory Visit: Payer: Self-pay | Admitting: Internal Medicine

## 2015-08-31 DIAGNOSIS — L931 Subacute cutaneous lupus erythematosus: Secondary | ICD-10-CM | POA: Diagnosis not present

## 2015-09-04 ENCOUNTER — Other Ambulatory Visit: Payer: Self-pay | Admitting: Internal Medicine

## 2015-09-14 DIAGNOSIS — R682 Dry mouth, unspecified: Secondary | ICD-10-CM | POA: Diagnosis not present

## 2015-09-14 DIAGNOSIS — L932 Other local lupus erythematosus: Secondary | ICD-10-CM | POA: Diagnosis not present

## 2015-09-14 DIAGNOSIS — R945 Abnormal results of liver function studies: Secondary | ICD-10-CM | POA: Diagnosis not present

## 2015-09-27 ENCOUNTER — Encounter: Payer: Self-pay | Admitting: *Deleted

## 2015-10-01 ENCOUNTER — Ambulatory Visit (INDEPENDENT_AMBULATORY_CARE_PROVIDER_SITE_OTHER): Payer: BLUE CROSS/BLUE SHIELD | Admitting: Internal Medicine

## 2015-10-01 ENCOUNTER — Encounter: Payer: Self-pay | Admitting: Internal Medicine

## 2015-10-01 VITALS — BP 106/62 | HR 90 | Temp 98.2°F | Resp 18 | Ht 71.75 in | Wt 204.0 lb

## 2015-10-01 DIAGNOSIS — K121 Other forms of stomatitis: Secondary | ICD-10-CM

## 2015-10-01 DIAGNOSIS — I10 Essential (primary) hypertension: Secondary | ICD-10-CM | POA: Diagnosis not present

## 2015-10-01 DIAGNOSIS — L932 Other local lupus erythematosus: Secondary | ICD-10-CM | POA: Diagnosis not present

## 2015-10-01 MED ORDER — TRIAMCINOLONE ACETONIDE 0.1 % EX CREA: 1.0000 "application " | TOPICAL_CREAM | Freq: Three times a day (TID) | CUTANEOUS | Status: AC

## 2015-10-01 NOTE — Progress Notes (Signed)
   Subjective:    Patient ID: Sean Maldonado, male    DOB: 07-16-1955, 60 y.o.   MRN: [id number]  HPI  Patient presents to office for evaluation of wounds on his feet.  He reports that he has some areas at the back of his heels.  He cannot tolerate wearing sneakers or shoes with a back on them.  He has not had any drainage, redness, or swelling.  He is back on plaquenil.  He reports that he has only been taking this for 10 days again.  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  He also has a lot of sores in his mouth.  He reports that he went to his dentist who didn't know what to do for him.   Review of Systems  Constitutional: Positive for chills. Negative for fever and fatigue.  HENT: Positive for sore throat.   Gastrointestinal: Negative for nausea and vomiting.  Skin: Positive for rash.       Objective:   Physical Exam  Constitutional: He is oriented to person, place, and time. He appears well-developed and well-nourished. No distress.  HENT:  Head: Normocephalic.  Mouth/Throat: Oral lesions present.  Ulcers throughout the mouth  Eyes: Conjunctivae are normal. No scleral icterus.  Neck: Normal range of motion. Neck supple.  Cardiovascular: Normal rate, regular rhythm, normal heart sounds and intact distal pulses.  Exam reveals no gallop and no friction rub.   No murmur heard. Pulmonary/Chest: Effort normal and breath sounds normal. No respiratory distress. He has no wheezes. He has no rales. He exhibits no tenderness.  Musculoskeletal: Normal range of motion.  Neurological: He is alert and oriented to person, place, and time.  Skin: Rash noted. He is not diaphoretic.  Erythematous macular scaling rash to the bilateral hands and feet.  There are 1-2 cm cracks to the bilateral posterior heels with some surrounding rash.  No palpable warmth, redness, or drainage.  Tender to palpation.    Psychiatric:  He has a normal mood and affect. His behavior is normal. Judgment and thought content normal.  Nursing note and vitals reviewed.   Filed Vitals:   10/01/15 1119  BP: 106/62  Pulse: 90  Temp: 98.2 F (36.8 C)  Resp: 18         Assessment & Plan:    1. Cutaneous lupus erythematosus -cont plaquenil -kenalog cream -vaseline with occlusive dressing multiple times per day   2. Mouth ulcers -magic mouth wash  3.  HTN -stop atenolol -stop Lisinopril HCTZ

## 2015-10-01 NOTE — Patient Instructions (Signed)
Please stop blood pressure medications.  Check your blood pressure one to two times daily at different times and call me in a week.  Then we will decide if everything is okay to come off the blood pressure medications completely.  Please put kenalog on the wounds on your feet 2-3 times daily.  Very thin layer.  Please use vaseline on top of the kenalog.  You can place a bandage on this to help it absorb.  Please use magic mouth wash up to 4 times daily to help with the mouth sores.    Please use a pillow under the feet when you are sitting in a recliner.  If wounds aren't significantly better in 1 week call the office and we will get you into podiatry.

## 2015-10-12 ENCOUNTER — Telehealth: Payer: Self-pay | Admitting: *Deleted

## 2015-10-12 NOTE — Telephone Encounter (Signed)
Patient called and states he has sores in his mouth and a white film.  Per Dr Melford Aase, continue using his Magic Mouthwash and add a rinse of 1 teaspoon of vinegar in a glass of water, hold the solution in his mouth for 60 seconds 4 times a day.  Patient advised.

## 2015-10-27 ENCOUNTER — Other Ambulatory Visit: Payer: Self-pay | Admitting: *Deleted

## 2015-10-27 MED ORDER — VALACYCLOVIR HCL 1 G PO TABS: ORAL_TABLET | ORAL | Status: DC

## 2015-11-02 ENCOUNTER — Other Ambulatory Visit: Payer: Self-pay | Admitting: Internal Medicine

## 2015-11-02 MED ORDER — PREDNISONE 20 MG PO TABS: ORAL_TABLET | ORAL | Status: DC

## 2015-11-03 ENCOUNTER — Emergency Department (HOSPITAL_COMMUNITY)
Admission: EM | Admit: 2015-11-03 | Discharge: 2015-11-03 | Disposition: A | Discharge status: Home / Self Care | Payer: BLUE CROSS/BLUE SHIELD | Attending: Emergency Medicine | Admitting: Emergency Medicine

## 2015-11-03 ENCOUNTER — Encounter (HOSPITAL_COMMUNITY): Payer: Self-pay | Admitting: *Deleted

## 2015-11-03 DIAGNOSIS — Z79899 Other long term (current) drug therapy: Secondary | ICD-10-CM | POA: Diagnosis not present

## 2015-11-03 DIAGNOSIS — I10 Essential (primary) hypertension: Secondary | ICD-10-CM | POA: Diagnosis not present

## 2015-11-03 DIAGNOSIS — J45909 Unspecified asthma, uncomplicated: Secondary | ICD-10-CM | POA: Insufficient documentation

## 2015-11-03 DIAGNOSIS — Z7982 Long term (current) use of aspirin: Secondary | ICD-10-CM | POA: Insufficient documentation

## 2015-11-03 DIAGNOSIS — R55 Syncope and collapse: Secondary | ICD-10-CM | POA: Diagnosis present

## 2015-11-03 DIAGNOSIS — I951 Orthostatic hypotension: Secondary | ICD-10-CM | POA: Diagnosis not present

## 2015-11-03 DIAGNOSIS — I959 Hypotension, unspecified: Secondary | ICD-10-CM | POA: Diagnosis not present

## 2015-11-03 DIAGNOSIS — M1990 Unspecified osteoarthritis, unspecified site: Secondary | ICD-10-CM | POA: Diagnosis not present

## 2015-11-03 LAB — I-STAT TROPONIN, ED: Troponin i, poc: 0.01 ng/mL (ref 0.00–0.08)

## 2015-11-03 LAB — CBC WITH DIFFERENTIAL/PLATELET
Basophils Absolute: 0 10*3/uL (ref 0.0–0.1)
Basophils Relative: 0 %
Eosinophils Absolute: 0 10*3/uL (ref 0.0–0.7)
Eosinophils Relative: 0 %
HCT: 29.7 % — ABNORMAL LOW (ref 39.0–52.0)
Hemoglobin: 10.4 g/dL — ABNORMAL LOW (ref 13.0–17.0)
Lymphocytes Relative: 12 %
Lymphs Abs: 0.6 10*3/uL — ABNORMAL LOW (ref 0.7–4.0)
MCH: 31.3 pg (ref 26.0–34.0)
MCHC: 35 g/dL (ref 30.0–36.0)
MCV: 89.5 fL (ref 78.0–100.0)
Monocytes Absolute: 0.5 10*3/uL (ref 0.1–1.0)
Monocytes Relative: 8 %
Neutro Abs: 4.4 10*3/uL (ref 1.7–7.7)
Neutrophils Relative %: 80 %
Platelets: 128 10*3/uL — ABNORMAL LOW (ref 150–400)
RBC: 3.32 MIL/uL — ABNORMAL LOW (ref 4.22–5.81)
RDW: 14.1 % (ref 11.5–15.5)
WBC: 5.5 10*3/uL (ref 4.0–10.5)

## 2015-11-03 LAB — BASIC METABOLIC PANEL
Anion gap: 8 (ref 5–15)
BUN: 19 mg/dL (ref 6–20)
CO2: 22 mmol/L (ref 22–32)
Calcium: 8.3 mg/dL — ABNORMAL LOW (ref 8.9–10.3)
Chloride: 107 mmol/L (ref 101–111)
Creatinine, Ser: 1.2 mg/dL (ref 0.61–1.24)
GFR calc Af Amer: >60 mL/min (ref >60)
GFR calc non Af Amer: >60 mL/min (ref >60)
Glucose, Bld: 125 mg/dL — ABNORMAL HIGH (ref 65–99)
Potassium: 3.7 mmol/L (ref 3.5–5.1)
Sodium: 137 mmol/L (ref 135–145)

## 2015-11-03 MED ORDER — SODIUM CHLORIDE 0.9 % IV BOLUS (SEPSIS)
1000.0000 mL | Freq: Once | INTRAVENOUS | Status: AC
  Administered 2015-11-03: 1000 mL via INTRAVENOUS

## 2015-11-03 NOTE — ED Notes (Signed)
Provided patient lotion, warm blanket, and dimmed lights for comfort.

## 2015-11-03 NOTE — ED Notes (Signed)
Dr. Windy Carina is aware of blood pressure and improved orthostatic.

## 2015-11-03 NOTE — ED Provider Notes (Signed)
CSN: [id number]     Arrival date & time 11/03/15  1327 History   First MD Initiated Contact with Patient 11/03/15 1341     Chief Complaint  Patient presents with  . Near Syncope     Patient is a 60 y.o. male presenting with near-syncope. The history is provided by the patient. No language interpreter was used.  Near Syncope   [place] is a 60 y.o. male who presents to the Emergency Department complaining of near syncope. He has a history of discoid lupus and was just started on prednisone 40 mg daily for a flare 3 days ago. Today he was in the pharmacy when he developed lightheadedness and fell. Per report he did not have a complete syncopal episode the patient does not recall all events. He states he is drinking plenty of fluids but still feels dehydrated. He feels improved compared to earlier but he does endorse generalized weakness and feeling poorly. He denies any fever, chest pain, cough, shortness of breath, abdominal pain, vomiting, diarrhea, dysuria.  Past Medical History  Diagnosis Date  . Seasonal allergies   . Cholelithiasis   . Hepatic steatosis   . Obesity   . GERD (gastroesophageal reflux disease)   . Dysrhythmia     RBBB-incomplete  . Arthritis     pt denies  . Asthma   . Hypercholesteremia   . Hypertension   . Prediabetes   . Vitamin D deficiency   . Fatty liver    Past Surgical History  Procedure Laterality Date  . Tonsillectomy    . Wisdom tooth extraction    . Axillary lymph node biopsy Right 08/27/2012    Procedure: AXILLARY LYMPH NODE BIOPSY ;  Surgeon: Rolm Bookbinder, MD;  Location: [facility];  Service: General;  Laterality: Right;  . Cholecystectomy N/A 12/28/2012    Procedure: LAPAROSCOPIC CHOLECYSTECTOMY WITH INTRAOPERATIVE CHOLANGIOGRAM;  Surgeon: Joyice Faster. Cornett, MD;  Location: [facility];  Service: General;  Laterality: N/A;   Family History  Problem Relation Age of Onset  . Diabetes Mother   . Heart disease Mother   . Colon polyps Neg Hx   .  Colon cancer Neg Hx    Social History  Substance Use Topics  . Smoking status: Never Smoker   . Smokeless tobacco: Never Used  . Alcohol Use: Yes     Comment: occasional    Review of Systems  Cardiovascular: Positive for near-syncope.  All other systems reviewed and are negative.     Allergies  Plaquenil; Lipitor; and Zocor  Home Medications   Prior to Admission medications   Medication Sig Start Date End Date Taking? Authorizing Provider  aspirin 81 MG tablet Take 81 mg by mouth daily.   Yes Historical Provider, MD  atenolol (TENORMIN) 100 MG tablet TAKE 1 TABLET DAILY FOR BLOOD PRESSURE. 04/27/15  Yes [name], MD  Cholecalciferol (VITAMIN D3) 5000 UNITS CAPS Take 1 capsule by mouth daily.   Yes Historical Provider, MD  Coenzyme Q10 (CO Q 10 PO) Take 1 tablet by mouth daily.   Yes Historical Provider, MD  fexofenadine (ALLEGRA) 180 MG tablet Take 180 mg by mouth daily as needed (allergies).   Yes Historical Provider, MD  MILK THISTLE PO Take 1 tablet by mouth daily.   Yes Historical Provider, MD  Multiple Vitamin (MULTIVITAMIN) capsule Take 1 capsule by mouth daily.   Yes Historical Provider, MD  Omega-3 Fatty Acids (FISH OIL PO) Take 1 capsule by mouth daily.   Yes Historical  Provider, MD  predniSONE (DELTASONE) 20 MG tablet 3 tabs po daily x 3 days, then 2 tabs x 3 days, then 1.5 tabs x 3 days, then 1 tab x 3 days, then 0.5 tabs x 3 days Patient taking differently: 3 tabs po daily x 3 days, then 2 tabs x 3 days, then 1.5 tabs x 3 days, then 1 tab x 3 days, then 0.5 tabs x 3 days Started 06/25 11/02/15  Yes Courtney Forcucci, PA-C  Probiotic Product (PROBIOTIC DAILY) CAPS Take 1 capsule by mouth daily.   Yes Historical Provider, MD  triamcinolone cream (KENALOG) 0.1 % Apply 1 application topically 3 (three) times daily. 10/01/15  Yes Courtney Forcucci, PA-C  Turmeric 500 MG TABS Take 1 tablet by mouth daily.   Yes Historical Provider, MD  valACYclovir (VALTREX) 1000 MG  tablet TAKE 1 TABLET THREE TIMES DAILY FOR 10 DAYS. Patient taking differently: Take 1,000 mg by mouth 3 (three) times daily. TAKE 1 TABLET THREE TIMES DAILY FOR 10 DAYS. Started 06/20 10/27/15  Yes [name], MD  ezetimibe (ZETIA) 10 MG tablet Take 1 tablet daily for Cholesterol Patient not taking: Reported on 11/03/2015 07/26/15 01/26/16  [name], MD  lisinopril-hydrochlorothiazide (PRINZIDE,ZESTORETIC) 20-12.5 MG tablet TAKE 1 TABLET DAILY FOR BLOOD PRESSURE. Patient not taking: Reported on 10/01/2015 09/04/15   [name], MD  omeprazole (PRILOSEC) 40 MG capsule TAKE 1 CAPSULE DAILY. Patient not taking: Reported on 11/03/2015 04/27/15   [name], MD   BP 139/95 mmHg  Pulse 75  Temp(Src) 97.7 F (36.5 C) (Oral)  Resp 17  SpO2 99% Physical Exam  Constitutional: He is oriented to person, place, and time. He appears well-developed and well-nourished.  HENT:  Head: Normocephalic and atraumatic.  Dry mucous membranes  Cardiovascular: Normal rate and regular rhythm.   No murmur heard. Pulmonary/Chest: Effort normal and breath sounds normal. No respiratory distress.  Abdominal: Soft. There is no tenderness. There is no rebound and no guarding.  Genitourinary:  Nontender rectal exam with brown stool, no gross blood.  Musculoskeletal: He exhibits no edema or tenderness.  Neurological: He is alert and oriented to person, place, and time.  Skin: Skin is warm and dry.  Multiple crusted lesions to chest, back, upper extremities  Psychiatric: He has a normal mood and affect. His behavior is normal.  Nursing note and vitals reviewed.   ED Course  Procedures (including critical care time) Labs Review Labs Reviewed  BASIC METABOLIC PANEL - Abnormal; Notable for the following:    Glucose, Bld 125 (*)    Calcium 8.3 (*)    All other components within normal limits  CBC WITH DIFFERENTIAL/PLATELET - Abnormal; Notable for the following:    RBC 3.32 (*)    Hemoglobin 10.4  (*)    HCT 29.7 (*)    Platelets 128 (*)    Lymphs Abs 0.6 (*)    All other components within normal limits  I-STAT TROPOININ, ED    Imaging Review No results found. I have personally reviewed and evaluated these images and lab results as part of my medical decision-making.   EKG Interpretation   Date/Time:  Tuesday November 03 2015 13:55:27 EDT Ventricular Rate:  71 PR Interval:    QRS Duration: 152 QT Interval:  441 QTC Calculation: 480 R Axis:   41 Text Interpretation:  Sinus rhythm Right bundle branch block Confirmed by  Hazle Coca [phone]) on 11/03/2015 2:51:01 PM      MDM   Final diagnoses:  Orthostatic hypotension  Patient here for evaluation of near syncopal episode while at the pharmacy, recently started on prednisone for discoid lupus. He is significantly orthostatic in the department. Will provide IV fluids. He is mildly anemic with no evidence of major GI bleed and no history of recent bleeding. Electrolytes are within normal limits. Plan to recheck following IV fluids. If his orthostasis resolved he will be able to go home and follow up with PCP.   Presentation is not consistent with ACS, PE, cardiogenic syncope.  Quintella Reichert, MD 11/03/15 [phone]

## 2015-11-03 NOTE — ED Notes (Signed)
Per EMS - patient comes from home where he lives alone via a local pharmacy where he was shopping.  Patient had a near syncopal episode in pharmacy.  Pharmacists confirmed that patient did not lose consciousness.  Patient states he is dehydrated because he is taking prednisone and he isn't drinking or eating a lot.  Patient takes prednisone for Lupus.  Patient's vitals 110/72, 98% RA, HR 72, CBG 100.  Patient denies pain, but did hit back of his head during near syncopal episode.

## 2015-11-03 NOTE — Discharge Instructions (Signed)
Orthostatic Hypotension Orthostatic hypotension is a sudden drop in blood pressure. It happens when you quickly stand up from a seated or lying position. You may feel dizzy or light-headed. This can last for just a few seconds or for up to a few minutes. It is usually not a serious problem. However, if this happens frequently or gets worse, it can be a sign of something more serious. CAUSES  Different things can cause orthostatic hypotension, including:   Loss of body fluids (dehydration).  Medicines that lower blood pressure.  Sudden changes in posture, such as standing up quickly after you have been sitting or lying down.  Taking too much of your medicine. SIGNS AND SYMPTOMS   Light-headedness or dizziness.   Fainting or near-fainting.   A fast heart rate.   Weakness.   Feeling tired (fatigue).  DIAGNOSIS  Your health care provider may do several things to help diagnose your condition and identify the cause. These may include:   Taking a medical history and doing a physical exam.  Checking your blood pressure. Your health care provider will check your blood pressure when you are:  Lying down.  Sitting.  Standing.  Using tilt table testing. In this test, you lie down on a table that moves from a lying position to a standing position. You will be strapped onto the table. This test monitors your blood pressure and heart rate when you are in different positions. TREATMENT  Treatment will vary depending on the cause. Possible treatments include:   Changing the dosage of your medicines.  Wearing compression stockings on your lower legs.  Standing up slowly after sitting or lying down.  Eating more salt.  Eating frequent, small meals.  In some cases, getting IV fluids.  Taking medicine to enhance fluid retention. HOME CARE INSTRUCTIONS  Only take over-the-counter or prescription medicines as directed by your health care provider.  Follow your health care  provider's instructions for changing the dosage of your current medicines.  Do not stop or adjust your medicine on your own.  Stand up slowly after sitting or lying down. This allows your body to adjust to the different position.  Wear compression stockings as directed.  Eat extra salt as directed.  Do not add extra salt to your diet unless directed to by your health care provider.  Eat frequent, small meals.  Avoid standing suddenly after eating.  Avoid hot showers or excessive heat as directed by your health care provider.  Keep all follow-up appointments. SEEK MEDICAL CARE IF:  You continue to feel dizzy or light-headed after standing.  You feel groggy or confused.  You feel cold, clammy, or sick to your stomach (nauseous).  You have blurred vision.  You feel short of breath. SEEK IMMEDIATE MEDICAL CARE IF:   You faint after standing.  You have chest pain.  You have difficulty breathing.   You lose feeling or movement in your arms or legs.   You have slurred speech or difficulty talking, or you are unable to talk.  MAKE SURE YOU:   Understand these instructions.  Will watch your condition.  Will get help right away if you are not doing well or get worse.   This information is not intended to replace advice given to you by your health care provider. Make sure you discuss any questions you have with your health care provider.   Document Released: 04/15/2002 Document Revised: 04/30/2013 Document Reviewed: 02/15/2013 Elsevier Interactive Patient Education [employer].

## 2015-11-03 NOTE — ED Provider Notes (Signed)
After fluids orthostasis significantly improved and pt d/ced home.  Blanchie Dessert, MD 11/03/15 1714

## 2015-11-03 NOTE — ED Notes (Signed)
Bed: [facility] Expected date:  Expected time:  Means of arrival:  Comments: EMS- syncopal

## 2015-11-12 DIAGNOSIS — L931 Subacute cutaneous lupus erythematosus: Secondary | ICD-10-CM | POA: Diagnosis not present

## 2015-11-12 DIAGNOSIS — L930 Discoid lupus erythematosus: Secondary | ICD-10-CM | POA: Diagnosis not present

## 2015-11-13 ENCOUNTER — Other Ambulatory Visit: Payer: Self-pay | Admitting: Internal Medicine

## 2015-11-16 ENCOUNTER — Inpatient Hospital Stay (HOSPITAL_COMMUNITY)
Admission: EM | Admit: 2015-11-16 | Discharge: 2015-11-17 | DRG: 596 | Disposition: A | Discharge status: Other Acute Inpatient Hospital | Payer: BLUE CROSS/BLUE SHIELD | Attending: Internal Medicine | Admitting: Internal Medicine

## 2015-11-16 ENCOUNTER — Emergency Department (HOSPITAL_COMMUNITY): Payer: BLUE CROSS/BLUE SHIELD

## 2015-11-16 ENCOUNTER — Encounter (HOSPITAL_COMMUNITY): Payer: Self-pay | Admitting: Emergency Medicine

## 2015-11-16 DIAGNOSIS — N179 Acute kidney failure, unspecified: Secondary | ICD-10-CM | POA: Diagnosis not present

## 2015-11-16 DIAGNOSIS — M659 Synovitis and tenosynovitis, unspecified: Secondary | ICD-10-CM | POA: Diagnosis not present

## 2015-11-16 DIAGNOSIS — Z79899 Other long term (current) drug therapy: Secondary | ICD-10-CM | POA: Diagnosis not present

## 2015-11-16 DIAGNOSIS — Z888 Allergy status to other drugs, medicaments and biological substances status: Secondary | ICD-10-CM

## 2015-11-16 DIAGNOSIS — E871 Hypo-osmolality and hyponatremia: Secondary | ICD-10-CM | POA: Diagnosis not present

## 2015-11-16 DIAGNOSIS — Z833 Family history of diabetes mellitus: Secondary | ICD-10-CM | POA: Diagnosis not present

## 2015-11-16 DIAGNOSIS — E7800 Pure hypercholesterolemia, unspecified: Secondary | ICD-10-CM | POA: Diagnosis not present

## 2015-11-16 DIAGNOSIS — E559 Vitamin D deficiency, unspecified: Secondary | ICD-10-CM | POA: Diagnosis present

## 2015-11-16 DIAGNOSIS — L959 Vasculitis limited to the skin, unspecified: Secondary | ICD-10-CM | POA: Diagnosis present

## 2015-11-16 DIAGNOSIS — J40 Bronchitis, not specified as acute or chronic: Secondary | ICD-10-CM | POA: Diagnosis not present

## 2015-11-16 DIAGNOSIS — R339 Retention of urine, unspecified: Secondary | ICD-10-CM | POA: Diagnosis not present

## 2015-11-16 DIAGNOSIS — Z7982 Long term (current) use of aspirin: Secondary | ICD-10-CM

## 2015-11-16 DIAGNOSIS — R627 Adult failure to thrive: Secondary | ICD-10-CM | POA: Diagnosis not present

## 2015-11-16 DIAGNOSIS — K219 Gastro-esophageal reflux disease without esophagitis: Secondary | ICD-10-CM | POA: Diagnosis not present

## 2015-11-16 DIAGNOSIS — L930 Discoid lupus erythematosus: Secondary | ICD-10-CM

## 2015-11-16 DIAGNOSIS — I10 Essential (primary) hypertension: Secondary | ICD-10-CM | POA: Diagnosis present

## 2015-11-16 DIAGNOSIS — L309 Dermatitis, unspecified: Secondary | ICD-10-CM | POA: Diagnosis not present

## 2015-11-16 DIAGNOSIS — M329 Systemic lupus erythematosus, unspecified: Secondary | ICD-10-CM | POA: Diagnosis present

## 2015-11-16 DIAGNOSIS — R55 Syncope and collapse: Secondary | ICD-10-CM | POA: Diagnosis not present

## 2015-11-16 DIAGNOSIS — J9601 Acute respiratory failure with hypoxia: Secondary | ICD-10-CM | POA: Diagnosis not present

## 2015-11-16 DIAGNOSIS — K760 Fatty (change of) liver, not elsewhere classified: Secondary | ICD-10-CM | POA: Diagnosis present

## 2015-11-16 DIAGNOSIS — J45909 Unspecified asthma, uncomplicated: Secondary | ICD-10-CM | POA: Diagnosis not present

## 2015-11-16 DIAGNOSIS — Z8249 Family history of ischemic heart disease and other diseases of the circulatory system: Secondary | ICD-10-CM | POA: Diagnosis not present

## 2015-11-16 DIAGNOSIS — N170 Acute kidney failure with tubular necrosis: Secondary | ICD-10-CM | POA: Diagnosis not present

## 2015-11-16 DIAGNOSIS — M3219 Other organ or system involvement in systemic lupus erythematosus: Secondary | ICD-10-CM | POA: Diagnosis not present

## 2015-11-16 DIAGNOSIS — I4510 Unspecified right bundle-branch block: Secondary | ICD-10-CM | POA: Diagnosis present

## 2015-11-16 DIAGNOSIS — R509 Fever, unspecified: Secondary | ICD-10-CM

## 2015-11-16 DIAGNOSIS — E860 Dehydration: Secondary | ICD-10-CM | POA: Diagnosis present

## 2015-11-16 DIAGNOSIS — E861 Hypovolemia: Secondary | ICD-10-CM | POA: Diagnosis not present

## 2015-11-16 DIAGNOSIS — L989 Disorder of the skin and subcutaneous tissue, unspecified: Secondary | ICD-10-CM | POA: Diagnosis not present

## 2015-11-16 DIAGNOSIS — A419 Sepsis, unspecified organism: Secondary | ICD-10-CM | POA: Diagnosis not present

## 2015-11-16 LAB — COMPREHENSIVE METABOLIC PANEL
ALK PHOS: 66 U/L (ref 38–126)
ALT: 31 U/L (ref 17–63)
ANION GAP: 12 (ref 5–15)
AST: 95 U/L — ABNORMAL HIGH (ref 15–41)
Albumin: 2.8 g/dL — ABNORMAL LOW (ref 3.5–5.0)
BUN: 20 mg/dL (ref 6–20)
CALCIUM: 7.8 mg/dL — AB (ref 8.9–10.3)
CO2: 20 mmol/L — AB (ref 22–32)
Chloride: 101 mmol/L (ref 101–111)
Creatinine, Ser: 1.32 mg/dL — ABNORMAL HIGH (ref 0.61–1.24)
GFR calc non Af Amer: 58 mL/min — ABNORMAL LOW (ref >60)
Glucose, Bld: 85 mg/dL (ref 65–99)
Potassium: 3.9 mmol/L (ref 3.5–5.1)
SODIUM: 133 mmol/L — AB (ref 135–145)
Total Bilirubin: 1.1 mg/dL (ref 0.3–1.2)
Total Protein: 6.8 g/dL (ref 6.5–8.1)

## 2015-11-16 LAB — URINE MICROSCOPIC-ADD ON

## 2015-11-16 LAB — CBC WITH DIFFERENTIAL/PLATELET
BASOS ABS: 0 10*3/uL (ref 0.0–0.1)
Basophils Relative: 0 %
EOS ABS: 0 10*3/uL (ref 0.0–0.7)
EOS PCT: 0 %
HCT: 30.7 % — ABNORMAL LOW (ref 39.0–52.0)
Hemoglobin: 10.4 g/dL — ABNORMAL LOW (ref 13.0–17.0)
Lymphocytes Relative: 10 %
Lymphs Abs: 0.8 10*3/uL (ref 0.7–4.0)
MCH: 30.4 pg (ref 26.0–34.0)
MCHC: 33.9 g/dL (ref 30.0–36.0)
MCV: 89.8 fL (ref 78.0–100.0)
MONO ABS: 0.7 10*3/uL (ref 0.1–1.0)
Monocytes Relative: 8 %
NEUTROS PCT: 82 %
Neutro Abs: 6.8 10*3/uL (ref 1.7–7.7)
PLATELETS: 103 10*3/uL — AB (ref 150–400)
RBC: 3.42 MIL/uL — AB (ref 4.22–5.81)
RDW: 14.9 % (ref 11.5–15.5)
WBC: 8.3 10*3/uL (ref 4.0–10.5)

## 2015-11-16 LAB — I-STAT CG4 LACTIC ACID, ED
LACTIC ACID, VENOUS: 1.17 mmol/L (ref 0.5–1.9)
Lactic Acid, Venous: 1.94 mmol/L (ref 0.5–1.9)

## 2015-11-16 LAB — URINALYSIS, ROUTINE W REFLEX MICROSCOPIC
GLUCOSE, UA: NEGATIVE mg/dL
HGB URINE DIPSTICK: NEGATIVE
Ketones, ur: 40 mg/dL — AB
Leukocytes, UA: NEGATIVE
Nitrite: NEGATIVE
Protein, ur: 30 mg/dL — AB
SPECIFIC GRAVITY, URINE: 1.021 (ref 1.005–1.030)
pH: 5.5 (ref 5.0–8.0)

## 2015-11-16 MED ORDER — DEXTROSE-NACL 5-0.9 % IV SOLN
INTRAVENOUS | Status: DC
  Administered 2015-11-16 – 2015-11-17 (×4): via INTRAVENOUS

## 2015-11-16 MED ORDER — ACETAMINOPHEN 500 MG PO TABS
500.0000 mg | ORAL_TABLET | Freq: Four times a day (QID) | ORAL | Status: DC | PRN
  Filled 2015-11-16: qty 1

## 2015-11-16 MED ORDER — SODIUM CHLORIDE 0.9 % IV BOLUS (SEPSIS)
1000.0000 mL | Freq: Once | INTRAVENOUS | Status: AC
  Administered 2015-11-16: 1000 mL via INTRAVENOUS

## 2015-11-16 MED ORDER — DEXTROSE-NACL 5-0.9 % IV SOLN: INTRAVENOUS | Status: DC

## 2015-11-16 MED ORDER — MORPHINE SULFATE (PF) 2 MG/ML IV SOLN
1.0000 mg | INTRAVENOUS | Status: DC | PRN
  Administered 2015-11-16 – 2015-11-17 (×5): 1 mg via INTRAVENOUS
  Filled 2015-11-16 (×6): qty 1

## 2015-11-16 MED ORDER — MORPHINE SULFATE (PF) 2 MG/ML IV SOLN: 2.0000 mg | INTRAVENOUS | Status: DC | PRN

## 2015-11-16 MED ORDER — MORPHINE SULFATE (PF) 4 MG/ML IV SOLN
4.0000 mg | Freq: Once | INTRAVENOUS | Status: AC
  Administered 2015-11-16: 4 mg via INTRAVENOUS
  Filled 2015-11-16: qty 1

## 2015-11-16 MED ORDER — SODIUM CHLORIDE 0.9% FLUSH: 3.0000 mL | Freq: Two times a day (BID) | INTRAVENOUS | Status: DC

## 2015-11-16 MED ORDER — ENOXAPARIN SODIUM 40 MG/0.4ML ~~LOC~~ SOLN
40.0000 mg | SUBCUTANEOUS | Status: DC
  Administered 2015-11-16: 40 mg via SUBCUTANEOUS
  Filled 2015-11-16: qty 0.4

## 2015-11-16 MED ORDER — FAMOTIDINE IN NACL 20-0.9 MG/50ML-% IV SOLN
20.0000 mg | Freq: Two times a day (BID) | INTRAVENOUS | Status: DC
  Filled 2015-11-16 (×3): qty 50

## 2015-11-16 MED ORDER — ONDANSETRON HCL 4 MG/2ML IJ SOLN
4.0000 mg | Freq: Four times a day (QID) | INTRAMUSCULAR | Status: DC
  Administered 2015-11-17: 4 mg via INTRAVENOUS
  Filled 2015-11-16 (×2): qty 2

## 2015-11-16 MED ORDER — CLINDAMYCIN PHOSPHATE 600 MG/50ML IV SOLN
600.0000 mg | Freq: Once | INTRAVENOUS | Status: AC
  Administered 2015-11-16: 600 mg via INTRAVENOUS
  Filled 2015-11-16: qty 50

## 2015-11-16 MED ORDER — ACETAMINOPHEN 325 MG PO TABS
650.0000 mg | ORAL_TABLET | Freq: Once | ORAL | Status: AC | PRN
  Administered 2015-11-16: 650 mg via ORAL
  Filled 2015-11-16: qty 2

## 2015-11-16 NOTE — ED Notes (Signed)
Pt has hx of discoid lupus. Pt presents to ED with worsening sore over generalized body. Visitor speaking for patient as patient is too uncomfortable to speak. Pt has open wounds all over body especially on back that they are worried are infected. Pt no longer eating or drinking. Pt having difficulty speaking due to sores on his mouth. A&Ox4 and ambulatory. Pt has had fever for a few days. Currently 100.5 with HR in the low 100s.

## 2015-11-16 NOTE — ED Provider Notes (Signed)
Emergency Department Provider Note  Time seen: Approximately 3:28 PM  I have reviewed the triage vital signs and the nursing notes.   HISTORY  Chief Complaint Dehydration and Failure To Thrive   HPI Sean Maldonado is a 60 y.o. male with PMH of discoid lupus presents to the emergency department for evaluation of worsening lightheadedness, fever, poor PO intake for the last 4 days. The patient is currently off of all lupus medications in preparation for a clinical trial. He stopped his Plaquenil two months prior and is currently not taking steroids. He denies any associated chest pain, difficulty breathing, productive cough. Family friend at bedside states that he feels he may have had a near syncope event today. Patient does not recall this well. He has had several near syncope events in the recent past. He has not been eating at all in the last 4 days. He is drinking very little. He notes worsening skin vasculitis and is having some drainage from the wounds.    Past Medical History  Diagnosis Date  . Seasonal allergies   . Cholelithiasis   . Hepatic steatosis   . Obesity   . GERD (gastroesophageal reflux disease)   . Dysrhythmia     RBBB-incomplete  . Arthritis     pt denies  . Asthma   . Hypercholesteremia   . Hypertension   . Prediabetes   . Vitamin D deficiency   . Fatty liver   . Discoid lupus     Patient Active Problem List   Diagnosis Date Noted  . Gout 02/09/2015  . BMI 33.0-33.9,adult 02/09/2015  . Medication management 07/23/2014  . Morbid obesity (BMI 33.37) 07/23/2014  . Asthma   . Seasonal allergies   . Hyperlipidemia   . Hypertension   . Prediabetes   . Vitamin D deficiency   . Gastritis, acute with hemorrhage 08/01/2011  . GERD (gastroesophageal reflux disease) 07/28/2011  . NASH (nonalcoholic steatohepatitis) 07/28/2011    Past Surgical History  Procedure Laterality Date  . Tonsillectomy    . Wisdom tooth extraction    . Axillary lymph node  biopsy Right 08/27/2012    Procedure: AXILLARY LYMPH NODE BIOPSY ;  Surgeon: Rolm Bookbinder, MD;  Location: [facility];  Service: General;  Laterality: Right;  . Cholecystectomy N/A 12/28/2012    Procedure: LAPAROSCOPIC CHOLECYSTECTOMY WITH INTRAOPERATIVE CHOLANGIOGRAM;  Surgeon: Joyice Faster. Cornett, MD;  Location: [facility];  Service: General;  Laterality: N/A;    Current Outpatient Rx  Name  Route  Sig  Dispense  Refill  . aspirin 81 MG tablet   Oral   Take 81 mg by mouth daily.         Marland Kitchen atenolol (TENORMIN) 100 MG tablet      TAKE 1 TABLET DAILY FOR BLOOD PRESSURE.   90 tablet   0   . Cholecalciferol (VITAMIN D3) 5000 UNITS CAPS   Oral   Take 1 capsule by mouth daily.         . Coenzyme Q10 (CO Q 10 PO)   Oral   Take 1 tablet by mouth daily.         Marland Kitchen ezetimibe (ZETIA) 10 MG tablet      Take 1 tablet daily for Cholesterol Patient not taking: Reported on 11/03/2015   90 tablet   1   . fexofenadine (ALLEGRA) 180 MG tablet   Oral   Take 180 mg by mouth daily as needed (allergies).         Marland Kitchen  lisinopril-hydrochlorothiazide (PRINZIDE,ZESTORETIC) 20-12.5 MG tablet      TAKE 1 TABLET DAILY FOR BLOOD PRESSURE. Patient not taking: Reported on 10/01/2015   90 tablet   1   . MILK THISTLE PO   Oral   Take 1 tablet by mouth daily.         . Multiple Vitamin (MULTIVITAMIN) capsule   Oral   Take 1 capsule by mouth daily.         . Omega-3 Fatty Acids (FISH OIL PO)   Oral   Take 1 capsule by mouth daily.         Marland Kitchen omeprazole (PRILOSEC) 40 MG capsule      TAKE 1 CAPSULE DAILY. Patient not taking: Reported on 11/03/2015   90 capsule   0   . predniSONE (DELTASONE) 20 MG tablet      3 tabs po daily x 3 days, then 2 tabs x 3 days, then 1.5 tabs x 3 days, then 1 tab x 3 days, then 0.5 tabs x 3 days Patient taking differently: 3 tabs po daily x 3 days, then 2 tabs x 3 days, then 1.5 tabs x 3 days, then 1 tab x 3 days, then 0.5 tabs x 3 days Started 06/25   27  tablet   0   . Probiotic Product (PROBIOTIC DAILY) CAPS   Oral   Take 1 capsule by mouth daily.         Marland Kitchen triamcinolone cream (KENALOG) 0.1 %   Topical   Apply 1 application topically 3 (three) times daily.   30 g   0   . Turmeric 500 MG TABS   Oral   Take 1 tablet by mouth daily.         . valACYclovir (VALTREX) 1000 MG tablet      TAKE 1 TABLET THREE TIMES DAILY FOR 10 DAYS. Patient taking differently: Take 1,000 mg by mouth 3 (three) times daily. TAKE 1 TABLET THREE TIMES DAILY FOR 10 DAYS. Started 06/20   30 tablet   0     Allergies Plaquenil; Lipitor; and Zocor  Family History  Problem Relation Age of Onset  . Diabetes Mother   . Heart disease Mother   . Colon polyps Neg Hx   . Colon cancer Neg Hx     Social History Social History  Substance Use Topics  . Smoking status: Never Smoker   . Smokeless tobacco: Never Used  . Alcohol Use: Yes     Comment: occasional    Review of Systems  Constitutional: Positive fever/chills Eyes: No visual changes. ENT: No sore throat. Cardiovascular: Denies chest pain. Respiratory: Denies shortness of breath. Gastrointestinal: No abdominal pain.  No nausea, no vomiting.  No diarrhea.  No constipation. Genitourinary: Negative for dysuria. Musculoskeletal: Positive lower back pain.  Skin: Diffuse worsening rash with drainage.  Neurological: Negative for headaches, focal weakness or numbness.  10-point ROS otherwise negative.  ____________________________________________   PHYSICAL EXAM:  VITAL SIGNS: ED Triage Vitals  Enc Vitals Group     BP 11/16/15 1346 108/69 mmHg     Pulse Rate 11/16/15 1346 101     Resp 11/16/15 1346 19     Temp 11/16/15 1346 100.5 F (38.1 C)     Temp Source 11/16/15 1346 Oral     SpO2 11/16/15 1346 100 %     Weight 11/16/15 1346 200 lb (90.719 kg)     Height 11/16/15 1346 6' (1.829 m)     Pain Score 11/16/15 1415  10   Constitutional: Alert and oriented. Well appearing and in  no acute distress. Eyes: Conjunctivae are normal. PERRL.  Head: Atraumatic. Mouth/Throat: Mucous membranes are dry.  Oropharynx non-erythematous. Neck: No stridor.  Cardiovascular: Tachycardia. Good peripheral circulation. Grossly normal heart sounds.   Respiratory: Normal respiratory effort.  No retractions. Lungs CTAB. Gastrointestinal: Soft and nontender. No distention.  Musculoskeletal: No lower extremity tenderness nor edema. No gross deformities of extremities. Neurologic:  Normal speech and language. No gross focal neurologic deficits are appreciated.  Skin:  Skin is warm. Diffuse vasculitis with draining component over back. Patient also with lesions in the mouth.  Psychiatric: Mood and affect are normal. Speech and behavior are normal.  ____________________________________________   LABS (all labs ordered are listed, but only abnormal results are displayed)  Labs Reviewed  COMPREHENSIVE METABOLIC PANEL - Abnormal; Notable for the following:    Sodium 133 (*)    CO2 20 (*)    Creatinine, Ser 1.32 (*)    Calcium 7.8 (*)    Albumin 2.8 (*)    AST 95 (*)    GFR calc non Af Amer 58 (*)    All other components within normal limits  CBC WITH DIFFERENTIAL/PLATELET - Abnormal; Notable for the following:    RBC 3.42 (*)    Hemoglobin 10.4 (*)    HCT 30.7 (*)    Platelets 103 (*)    All other components within normal limits  URINALYSIS, ROUTINE W REFLEX MICROSCOPIC (NOT AT [facility]) - Abnormal; Notable for the following:    Color, Urine AMBER (*)    APPearance CLOUDY (*)    Bilirubin Urine SMALL (*)    Ketones, ur 40 (*)    Protein, ur 30 (*)    All other components within normal limits  URINE MICROSCOPIC-ADD ON - Abnormal; Notable for the following:    Squamous Epithelial / LPF 0-5 (*)    Bacteria, UA MANY (*)    Casts HYALINE CASTS (*)    All other components within normal limits  I-STAT CG4 LACTIC ACID, ED - Abnormal; Notable for the following:    Lactic Acid, Venous 1.94  (*)    All other components within normal limits  CULTURE, BLOOD (ROUTINE X 2)  CULTURE, BLOOD (ROUTINE X 2)  I-STAT CG4 LACTIC ACID, ED   ____________________________________________  RADIOLOGY  Dg Chest 2 View  11/16/2015  CLINICAL DATA:  Fever. Sores generalized over the body and in the mouth. Lupus. EXAM: CHEST  2 VIEW COMPARISON:  Chest CT dated 07/25/2012. FINDINGS: Normal sized heart. Clear lungs. Mild central peribronchial thickening. Lower thoracic spine degenerative changes. Cholecystectomy clips. IMPRESSION: Mild bronchitic changes. Electronically Signed   By: Claudie Revering M.D.   On: 11/16/2015 15:54    ____________________________________________   PROCEDURES  Procedure(s) performed:   Procedures  None ____________________________________________   INITIAL IMPRESSION / ASSESSMENT AND PLAN / ED COURSE  Pertinent labs & imaging results that were available during my care of the patient were reviewed by me and considered in my medical decision making (see chart for details).  Patient has medical history of discoid lupus presents to the emergency department for evaluation of lightheadedness, near syncope, fever, in the setting of decreased PO intake. Patient has diffuse rash over his entire body. Rash over his right knee and back is unroofed and draining. He has a fever and tachycardia in the ED. Patient not on steroids or other immune suppressant currently. No clear source of infection at this time. Exam, with exception  of skin, is nonspecific. Plan for cultures, lactate, labs, CXR. Will give IVF and pain medication.   04:08 PM  Slight lactate elevation noted. No leukocytosis. IVF given. Will hold abx at this time given unclear source and relative hemodynamic stability.   04:39 PM Paged hospitalist for admission. Ordered Clindamycin for possible skin source of fever. Continue IVF.   04:50 PM Discussed patient's case with Hospitalist, Dr. Cathlean Sauer.  Recommend admission  to inpatient, remote telemetry bed.  I will place holding orders per their request. Patient and family (if present) updated with plan. Care transferred to hospitalist service.  I reviewed all nursing notes, vitals, pertinent old records, EKGs, labs, imaging (as available).  ____________________________________________  FINAL CLINICAL IMPRESSION(S) / ED DIAGNOSES  Final diagnoses:  Fever, unspecified fever cause  Near syncope  Discoid lupus     MEDICATIONS GIVEN DURING THIS VISIT:  Medications  clindamycin (CLEOCIN) IVPB 600 mg (600 mg Intravenous New Bag/Given 11/16/15 1652)  acetaminophen (TYLENOL) tablet 650 mg (650 mg Oral Given 11/16/15 1425)  sodium chloride 0.9 % bolus 1,000 mL (0 mLs Intravenous Stopped 11/16/15 1657)  morphine 4 MG/ML injection 4 mg (4 mg Intravenous Given 11/16/15 1618)     NEW OUTPATIENT MEDICATIONS STARTED DURING THIS VISIT:  None   Note:  This document was prepared using Dragon voice recognition software and may include unintentional dictation errors.  Nanda Quinton, MD Emergency Medicine  Margette Fast, MD 11/16/15 1700

## 2015-11-16 NOTE — H&P (Addendum)
History and Physical    ALYN JURNEY HWE:[id number] DOB: [birth year] DOA: 11/16/2015  PCP: Alesia Richards, MD   Patient coming from: Home  Chief Complaint: Skin lesions  HPI: Sean Maldonado is a 60 y.o. male who presents to the hospital chief complaint of generalized skin lesions. Over last 3 weeks patient has been developing worsening diffuse skin lesions that have been generalized, with no improving or worsening factors, associated with significant pain and serous drainage. Over last 3 days his symptoms have been worsening including face edema, dry oral mucosa, generalized weakness. He has been unable to eat or drink for last 3 days due to his significant symptoms.  Patient was diagnosed in March 2017 with lupus. She did not tolerate Plaquenil therapy, he has been off immunosuppressive therapy.  ED Course: IV fluids, morphine, clindamycin.  Review of Systems: Gen. No fevers or chills positive generalized malaise and generalized weakness Cardiovascular. No angina, claudication, PND orthopnea Pulmonary. No dyspnea, cough or hemoptysis Gastrointestinal, no nausea, vomiting or diarrhea. Musculoskeletal. No significant joint pain Skin. Lesions as mentioned in history present illness Endocrine no tremors, heat or cold intolerance Urology no dysuria or increased urinary frequency Neurology no seizures or paresthesias Hematology no easy bruisability or frequent infections  Past Medical History  Diagnosis Date  . Seasonal allergies   . Cholelithiasis   . Hepatic steatosis   . Obesity   . GERD (gastroesophageal reflux disease)   . Dysrhythmia     RBBB-incomplete  . Arthritis     pt denies  . Asthma   . Hypercholesteremia   . Hypertension   . Prediabetes   . Vitamin D deficiency   . Fatty liver   . Discoid lupus     Past Surgical History  Procedure Laterality Date  . Tonsillectomy    . Wisdom tooth extraction    . Axillary lymph node biopsy Right 08/27/2012   Procedure: AXILLARY LYMPH NODE BIOPSY ;  Surgeon: Rolm Bookbinder, MD;  Location: [facility];  Service: General;  Laterality: Right;  . Cholecystectomy N/A 12/28/2012    Procedure: LAPAROSCOPIC CHOLECYSTECTOMY WITH INTRAOPERATIVE CHOLANGIOGRAM;  Surgeon: Joyice Faster. Cornett, MD;  Location: [facility];  Service: General;  Laterality: N/A;     reports that he has never smoked. He has never used smokeless tobacco. He reports that he drinks alcohol. He reports that he does not use illicit drugs.  Allergies  Allergen Reactions  . Plaquenil [Hydroxychloroquine] Other (See Comments)    Skin lupas   . Lipitor [Atorvastatin]     Myalgias   . Zocor [Simvastatin]     Myalgias    Family History  Problem Relation Age of Onset  . Diabetes Mother   . Heart disease Mother   . Colon polyps Neg Hx   . Colon cancer Neg Hx    Family history positive for cardiovascular disease.  Prior to Admission medications   Medication Sig Start Date End Date Taking? Authorizing Provider  aspirin 81 MG tablet Take 81 mg by mouth daily.   Yes Historical Provider, MD  atenolol (TENORMIN) 100 MG tablet TAKE 1 TABLET DAILY FOR BLOOD PRESSURE. 04/27/15  Yes [name], MD  Cholecalciferol (VITAMIN D3) 5000 UNITS CAPS Take 1 capsule by mouth daily.   Yes Historical Provider, MD  Coenzyme Q10 (CO Q 10 PO) Take 1 tablet by mouth daily.   Yes Historical Provider, MD  MILK THISTLE PO Take 1 tablet by mouth daily.   Yes Historical Provider, MD  Multiple Vitamin (MULTIVITAMIN)  capsule Take 1 capsule by mouth daily.   Yes Historical Provider, MD  Omega-3 Fatty Acids (FISH OIL PO) Take 1 capsule by mouth daily.   Yes Historical Provider, MD  Probiotic Product (PROBIOTIC DAILY) CAPS Take 1 capsule by mouth daily.   Yes Historical Provider, MD  triamcinolone cream (KENALOG) 0.1 % Apply 1 application topically 3 (three) times daily. 10/01/15  Yes Courtney Forcucci, PA-C  Turmeric 500 MG TABS Take 1 tablet by mouth daily.   Yes  Historical Provider, MD    Physical Exam: Filed Vitals:   11/16/15 1346 11/16/15 1647  BP: 108/69 121/66  Pulse: 101 86  Temp: 100.5 F (38.1 C)   TempSrc: Oral   Resp: 19 18  Height: 6' (1.829 m)   Weight: 90.719 kg (200 lb)   SpO2: 100% 100%      Constitutional: NAD, calm, comfortable Filed Vitals:   11/16/15 1346 11/16/15 1647  BP: 108/69 121/66  Pulse: 101 86  Temp: 100.5 F (38.1 C)   TempSrc: Oral   Resp: 19 18  Height: 6' (1.829 m)   Weight: 90.719 kg (200 lb)   SpO2: 100% 100%   Eyes: PERRL, lids and conjunctivae pallor ENMT: Mucous membranes are very dry. Posterior pharynx clear of any exudate or lesions.Normal dentition. Gum hyperplasia, no evidence of thrush or ulcers, positive lip edema. Neck: normal, supple, no masses, no thyromegaly Respiratory: clear to auscultation bilaterally, no wheezing, no crackles. Normal respiratory effort. No accessory muscle use. Decreased breath sounds due to poor inspiratory effort. Cardiovascular: Regular rate and rhythm, no murmurs / rubs / gallops. No extremity edema. 2+ pedal pulses. No carotid bruits.  Abdomen: no tenderness, no masses palpated. No hepatosplenomegaly. Bowel sounds positive.  Musculoskeletal: no clubbing / cyanosis. No joint deformity upper and lower extremities. Good ROM, no contractures. Normal muscle tone.  Skin: He has diffuse ulcerated lesions in both abdomen and torso 4 extremities, they're different sizes in different stages, some of them with serous segments drainage, no pus. 10 to be tender to palpation Neurologic: CN 2-12 grossly intact. Sensation intact, DTR normal. Strength 5/5 in all 4.  Psychiatric: Normal judgment and insight. Alert and oriented x 3. Normal mood.    Labs on Admission: I have personally reviewed following labs and imaging studies  CBC:  Recent Labs Lab 11/16/15 1510  WBC 8.3  NEUTROABS 6.8  HGB 10.4*  HCT 30.7*  MCV 89.8  PLT 630*   Basic Metabolic Panel:  Recent  Labs Lab 11/16/15 1510  NA 133*  K 3.9  CL 101  CO2 20*  GLUCOSE 85  BUN 20  CREATININE 1.32*  CALCIUM 7.8*   GFR: Estimated Creatinine Clearance: 67 mL/min (by C-G formula based on Cr of 1.32). Liver Function Tests:  Recent Labs Lab 11/16/15 1510  AST 95*  ALT 31  ALKPHOS 66  BILITOT 1.1  PROT 6.8  ALBUMIN 2.8*   No results for input(s): LIPASE, AMYLASE in the last 168 hours. No results for input(s): AMMONIA in the last 168 hours. Coagulation Profile: No results for input(s): INR, PROTIME in the last 168 hours. Cardiac Enzymes: No results for input(s): CKTOTAL, CKMB, CKMBINDEX, TROPONINI in the last 168 hours. BNP (last 3 results) No results for input(s): PROBNP in the last 8760 hours. HbA1C: No results for input(s): HGBA1C in the last 72 hours. CBG: No results for input(s): GLUCAP in the last 168 hours. Lipid Profile: No results for input(s): CHOL, HDL, LDLCALC, TRIG, CHOLHDL, LDLDIRECT in the last 72  hours. Thyroid Function Tests: No results for input(s): TSH, T4TOTAL, FREET4, T3FREE, THYROIDAB in the last 72 hours. Anemia Panel: No results for input(s): VITAMINB12, FOLATE, FERRITIN, TIBC, IRON, RETICCTPCT in the last 72 hours. Urine analysis:    Component Value Date/Time   COLORURINE AMBER* 11/16/2015 1600   APPEARANCEUR CLOUDY* 11/16/2015 1600   LABSPEC 1.021 11/16/2015 1600   PHURINE 5.5 11/16/2015 1600   GLUCOSEU NEGATIVE 11/16/2015 1600   HGBUR NEGATIVE 11/16/2015 1600   BILIRUBINUR SMALL* 11/16/2015 1600   KETONESUR 40* 11/16/2015 1600   PROTEINUR 30* 11/16/2015 1600   UROBILINOGEN 0.2 12/26/2012 2015   NITRITE NEGATIVE 11/16/2015 1600   LEUKOCYTESUR NEGATIVE 11/16/2015 1600   Sepsis Labs: !!!!!!!!!!!!!!!!!!!!!!!!!!!!!!!!!!!!!!!!!!!! @LABRCNTIP (procalcitonin:4,lacticidven:4) )No results found for this or any previous visit (from the past 240 hour(s)).   Radiological Exams on Admission: Dg Chest 2 View  11/16/2015  CLINICAL DATA:  Fever. Sores  generalized over the body and in the mouth. Lupus. EXAM: CHEST  2 VIEW COMPARISON:  Chest CT dated 07/25/2012. FINDINGS: Normal sized heart. Clear lungs. Mild central peribronchial thickening. Lower thoracic spine degenerative changes. Cholecystectomy clips. IMPRESSION: Mild bronchitic changes. Electronically Signed   By: Claudie Revering M.D.   On: 11/16/2015 15:54    EKG:   Assessment/Plan Active Problems:   Near syncope   Lupus (systemic lupus erythematosus) ([place])   This is a 59 year old male who has been recently diagnosed with cutaneous lupus erythematous. Patient has been basically without treatment, patient has developed significant skin lesions associated with the generalized weakness, and poor oral intake. On the physical examination his blood pressure is 202 and 334 systolic, his heart rate 35-686, he has diffuse skin lesions that have different sizes in different stages. His serum  sodium is 133 with potassium 3.4, the creatinine 1.32 with the BUN of 20, his white cell count is 8.3 with the hemoglobin 10.4 and platelet count 103.   His working diagnosis acute cutaneos lupus flare, complciated by dehydration, hyponatremia and acute kidney injury.  1. Cardiovascular. Hypovolemia. Patient will be hydrated with a normal saline at 100 ml per hour. Patient with significant volume loss probably is due to insensible losses related to his diffuse skin lesions, that clinically behaves like a third-degree burn.  2. Pulmonary. Patient is oxygen well, continue pulse oximetry and submental oxygen. There is no evidence of pulmonary lupus involvement. Will check a baseline chest film.  3. Nephrology. Acute kidney injury with hyponatremia. Probably related to hypovolemia due to insensible volume loss through his skin lesions. Patient will be placed on normal saline at 100 ml per hour. Will follow kidney function in the morning, avoid hypotension or nephrotoxic agents. Certainly the differential has to be  considered lupus nephritis, will check complements C3 and C4 and urinary electrolytes.  4. Rheumatology. Acute cutaneous lupus flare. [facility] consult rheumatology for further recommendations, probably patient will need high-dose systemic steroids and further immunosuppressive therapy.  Patient is a high risk of developing worsening lupus flare, acute kidney injury and electrolyte abnormalities.  DVT prophylaxis: Low molecular weight heparin  Code Status: Full Family Communication:  I spoke with patient's family at the bedside and all questions were addressed. Key permission for patient's care was obtained. Disposition Plan: Consults called:  Admission status: inpatient.  Mauricio Gerome Apley MD [facility] Pager [phone]  If 7PM-7AM, please contact night-coverage [email or web address] Password [email or web address]  11/16/2015, 6:15 PM

## 2015-11-17 DIAGNOSIS — R918 Other nonspecific abnormal finding of lung field: Secondary | ICD-10-CM | POA: Diagnosis not present

## 2015-11-17 DIAGNOSIS — R634 Abnormal weight loss: Secondary | ICD-10-CM | POA: Diagnosis not present

## 2015-11-17 DIAGNOSIS — N170 Acute kidney failure with tubular necrosis: Secondary | ICD-10-CM | POA: Diagnosis not present

## 2015-11-17 DIAGNOSIS — L309 Dermatitis, unspecified: Secondary | ICD-10-CM | POA: Diagnosis not present

## 2015-11-17 DIAGNOSIS — J9691 Respiratory failure, unspecified with hypoxia: Secondary | ICD-10-CM | POA: Diagnosis not present

## 2015-11-17 DIAGNOSIS — I10 Essential (primary) hypertension: Secondary | ICD-10-CM | POA: Diagnosis not present

## 2015-11-17 DIAGNOSIS — L930 Discoid lupus erythematosus: Secondary | ICD-10-CM | POA: Diagnosis not present

## 2015-11-17 DIAGNOSIS — R760 Raised antibody titer: Secondary | ICD-10-CM | POA: Diagnosis not present

## 2015-11-17 DIAGNOSIS — D638 Anemia in other chronic diseases classified elsewhere: Secondary | ICD-10-CM | POA: Diagnosis not present

## 2015-11-17 DIAGNOSIS — D696 Thrombocytopenia, unspecified: Secondary | ICD-10-CM | POA: Diagnosis not present

## 2015-11-17 DIAGNOSIS — L988 Other specified disorders of the skin and subcutaneous tissue: Secondary | ICD-10-CM | POA: Diagnosis not present

## 2015-11-17 DIAGNOSIS — M659 Synovitis and tenosynovitis, unspecified: Secondary | ICD-10-CM | POA: Diagnosis not present

## 2015-11-17 DIAGNOSIS — R21 Rash and other nonspecific skin eruption: Secondary | ICD-10-CM | POA: Diagnosis not present

## 2015-11-17 DIAGNOSIS — N179 Acute kidney failure, unspecified: Secondary | ICD-10-CM | POA: Diagnosis not present

## 2015-11-17 DIAGNOSIS — R590 Localized enlarged lymph nodes: Secondary | ICD-10-CM | POA: Diagnosis not present

## 2015-11-17 DIAGNOSIS — R945 Abnormal results of liver function studies: Secondary | ICD-10-CM | POA: Diagnosis not present

## 2015-11-17 DIAGNOSIS — R7989 Other specified abnormal findings of blood chemistry: Secondary | ICD-10-CM | POA: Diagnosis not present

## 2015-11-17 DIAGNOSIS — M329 Systemic lupus erythematosus, unspecified: Secondary | ICD-10-CM | POA: Diagnosis not present

## 2015-11-17 DIAGNOSIS — J9601 Acute respiratory failure with hypoxia: Secondary | ICD-10-CM | POA: Diagnosis not present

## 2015-11-17 DIAGNOSIS — M3219 Other organ or system involvement in systemic lupus erythematosus: Secondary | ICD-10-CM | POA: Diagnosis not present

## 2015-11-17 DIAGNOSIS — R400 Somnolence: Secondary | ICD-10-CM | POA: Diagnosis not present

## 2015-11-17 DIAGNOSIS — Z7952 Long term (current) use of systemic steroids: Secondary | ICD-10-CM | POA: Diagnosis not present

## 2015-11-17 DIAGNOSIS — D649 Anemia, unspecified: Secondary | ICD-10-CM | POA: Diagnosis not present

## 2015-11-17 DIAGNOSIS — I4510 Unspecified right bundle-branch block: Secondary | ICD-10-CM | POA: Diagnosis not present

## 2015-11-17 DIAGNOSIS — R740 Nonspecific elevation of levels of transaminase and lactic acid dehydrogenase [LDH]: Secondary | ICD-10-CM | POA: Diagnosis not present

## 2015-11-17 DIAGNOSIS — R339 Retention of urine, unspecified: Secondary | ICD-10-CM | POA: Diagnosis not present

## 2015-11-17 DIAGNOSIS — K598 Other specified functional intestinal disorders: Secondary | ICD-10-CM | POA: Diagnosis not present

## 2015-11-17 DIAGNOSIS — A419 Sepsis, unspecified organism: Secondary | ICD-10-CM | POA: Diagnosis not present

## 2015-11-17 DIAGNOSIS — I714 Abdominal aortic aneurysm, without rupture: Secondary | ICD-10-CM | POA: Diagnosis not present

## 2015-11-17 DIAGNOSIS — H01129 Discoid lupus erythematosus of unspecified eye, unspecified eyelid: Secondary | ICD-10-CM | POA: Diagnosis not present

## 2015-11-17 LAB — COMPREHENSIVE METABOLIC PANEL
ALK PHOS: 126 U/L (ref 38–126)
ALT: 42 U/L (ref 17–63)
ANION GAP: 7 (ref 5–15)
AST: 143 U/L — ABNORMAL HIGH (ref 15–41)
Albumin: 2.5 g/dL — ABNORMAL LOW (ref 3.5–5.0)
BILIRUBIN TOTAL: 1.2 mg/dL (ref 0.3–1.2)
BUN: 17 mg/dL (ref 6–20)
CALCIUM: 7.4 mg/dL — AB (ref 8.9–10.3)
CO2: 22 mmol/L (ref 22–32)
CREATININE: 1.06 mg/dL (ref 0.61–1.24)
Chloride: 105 mmol/L (ref 101–111)
GFR calc non Af Amer: >60 mL/min (ref >60)
GLUCOSE: 97 mg/dL (ref 65–99)
Potassium: 3.5 mmol/L (ref 3.5–5.1)
Sodium: 134 mmol/L — ABNORMAL LOW (ref 135–145)
TOTAL PROTEIN: 6.5 g/dL (ref 6.5–8.1)

## 2015-11-17 LAB — CBC WITH DIFFERENTIAL/PLATELET
Basophils Absolute: 0 10*3/uL (ref 0.0–0.1)
Basophils Relative: 0 %
EOS ABS: 0 10*3/uL (ref 0.0–0.7)
Eosinophils Relative: 0 %
HCT: 28.4 % — ABNORMAL LOW (ref 39.0–52.0)
Hemoglobin: 9.6 g/dL — ABNORMAL LOW (ref 13.0–17.0)
LYMPHS ABS: 1 10*3/uL (ref 0.7–4.0)
LYMPHS PCT: 16 %
MCH: 30.2 pg (ref 26.0–34.0)
MCHC: 33.8 g/dL (ref 30.0–36.0)
MCV: 89.3 fL (ref 78.0–100.0)
MONO ABS: 0.5 10*3/uL (ref 0.1–1.0)
Monocytes Relative: 7 %
NEUTROS PCT: 77 %
Neutro Abs: 5 10*3/uL (ref 1.7–7.7)
PLATELETS: 100 10*3/uL — AB (ref 150–400)
RBC: 3.18 MIL/uL — ABNORMAL LOW (ref 4.22–5.81)
RDW: 14.7 % (ref 11.5–15.5)
WBC: 6.5 10*3/uL (ref 4.0–10.5)

## 2015-11-17 MED ORDER — ACETAMINOPHEN 650 MG RE SUPP
650.0000 mg | RECTAL | Status: DC | PRN
  Administered 2015-11-17: 650 mg via RECTAL
  Filled 2015-11-17: qty 1

## 2015-11-17 MED ORDER — SODIUM CHLORIDE 0.9 % IV BOLUS (SEPSIS)
1000.0000 mL | Freq: Once | INTRAVENOUS | Status: AC
Start: 2015-11-17 — End: 2015-11-17
  Administered 2015-11-17: 1000 mL via INTRAVENOUS

## 2015-11-17 MED ORDER — MORPHINE SULFATE (PF) 2 MG/ML IV SOLN
1.0000 mg | Freq: Once | INTRAVENOUS | Status: AC
  Administered 2015-11-17: 1 mg via INTRAVENOUS
  Filled 2015-11-17: qty 1

## 2015-11-17 MED ORDER — HYDROCERIN EX CREA
TOPICAL_CREAM | Freq: Two times a day (BID) | CUTANEOUS | Status: DC
  Filled 2015-11-17: qty 113

## 2015-11-17 MED ORDER — MAGIC MOUTHWASH
5.0000 mL | Freq: Four times a day (QID) | ORAL | Status: DC | PRN
  Filled 2015-11-17: qty 5

## 2015-11-17 MED ORDER — VITAMINS A & D EX OINT
TOPICAL_OINTMENT | CUTANEOUS | Status: AC
  Administered 2015-11-17: 17:00:00
  Filled 2015-11-17: qty 5

## 2015-11-17 NOTE — Progress Notes (Signed)
Was notified by CM that friends of the pt in 1428 needed to be called immediately regarding his care. Leadership had rounded in the patient room earlier in the day and had expressed no concerns regarding care. Went to the patient room and spoke with him. The patient is alert and oriented times 3. Patient was asked about concerns over his care and he voiced he none. He was told about the phone call from his friend. His friend at that time called and wanted to talk with leadership. Patient was asked at that point if it was okay to share information with this friend. , Joanie Coddington. Permission was granted. Mr Stephanie Coup was put on speaker phone. He expressed concern that the pts shirt had not been removed. We explained that the pt had refused to have his shirt removed earlier and now that he had agreed to have it removed we were waiting for a speciality bed and cream for his back. The friend kept insisting that we not listen to the pt when he refused something. We explained that if the pt is axox3 then the pt has the right to refuse care. When asked if he was the pts HCPOA he said he did not know. We reassured him that we would care for the pt and that the pt was being cared for and we were respecting his patient rights.

## 2015-11-17 NOTE — Discharge Summary (Addendum)
[address] SHAHMEER BUNN, is a 60 y.o. male  DOB 1955/12/14  MRN [id number].  Admission date:  11/16/2015  Admitting Physician  Mauricio Gerome Apley, MD  Discharge Date:  11/17/2015   Primary MD  Alesia Richards, MD  Recommendations for primary care physician for things to follow:   Patient is been discharged to [facility] for further management. Patient required immunosuppressive therapy for his uncontrolled lupus.   Admission Diagnosis  Discoid lupus [L93.0] Near syncope [R55] Fever, unspecified fever cause [R50.9]   Discharge Diagnosis  Discoid lupus [L93.0] Near syncope [R55] Fever, unspecified fever cause [R50.9]    Active Problems:   Near syncope   Cutaneous Lupus      Past Medical History  Diagnosis Date  . Seasonal allergies   . Cholelithiasis   . Hepatic steatosis   . Obesity   . GERD (gastroesophageal reflux disease)   . Dysrhythmia     RBBB-incomplete  . Arthritis     pt denies  . Asthma   . Hypercholesteremia   . Hypertension   . Prediabetes   . Vitamin D deficiency   . Fatty liver   . Discoid lupus     Past Surgical History  Procedure Laterality Date  . Tonsillectomy    . Wisdom tooth extraction    . Axillary lymph node biopsy Right 08/27/2012    Procedure: AXILLARY LYMPH NODE BIOPSY ;  Surgeon: Rolm Bookbinder, MD;  Location: [facility];  Service: General;  Laterality: Right;  . Cholecystectomy N/A 12/28/2012    Procedure: LAPAROSCOPIC CHOLECYSTECTOMY WITH INTRAOPERATIVE CHOLANGIOGRAM;  Surgeon: Joyice Faster. Cornett, MD;  Location: [facility];  Service: General;  Laterality: N/A;       HPI  from the history and physical done on the day of admission:    This is a 60 year old gentleman who presents to hospital with the chief complaint of severe cutaneous lesions diffusely spread and tender, associate with weakness and decreased oral intake. Patient has been diagnosed with  cutaneos lupus in March 2017. Apparently he did not tolerate plaquenil.  On physical examination he was dehydrated, his blood pressure is 841 systolic, his creatinine is 1.3 with BUN 20, his white count 8.3. He had diffuse ulcerated lesions throughout his body, painful to palpation, his oral mucosa was free of ulcers but he has significant swelling including his gums.  Patient was admitted to hospital working diagnosis of uncontrolled cutaneos lupus complicated by dehydration and acute kidney injury.     Hospital Course:   1. Cardiovascular. Patient remained hemodynamic stable patient tolerated well IV fluids. Patient had  fever 100.1. There is no evidence of systemic infection. Patient did receive clindamycin in the emergency department on 11/16/2015.  2. Pulmonary. Patient has been oxygen well, his oximetry has been remained 97-98% on room air  3. Nephrology. Acute kidney injury His kidney function improved cr went down to 1.06 with a BUN of the 17. C3 and C4 were sent results still pending, no clinical evidence of lupus nephritis.  4. Skin. Cutaneus lupus,  diffuse and severe. I have contacted Dr. Amil Amen, rheumatologist, who has seen patient 1 time in the outpatient setting. Recommendation for aggressive immunosuppressive therapy due to his severe symptoms. [facility] called [facility] who kindly accepted patient for further management.   5. GU. Patient developed urinary retention about 600 mL and Foley catheter has been placed.  Discharge Condition: Stable  Follow UP     Consults obtained -   Diet and Activity recommendation: See Discharge Instructions below  Discharge Instructions         Discharge Medications       Medication List    ASK your doctor about these medications        aspirin 81 MG tablet  Take 81 mg by mouth daily.     atenolol 100 MG tablet  Commonly known as:  TENORMIN  TAKE 1 TABLET DAILY FOR BLOOD PRESSURE.     CO Q 10 PO    Take 1 tablet by mouth daily.     FISH OIL PO  Take 1 capsule by mouth daily.     MILK THISTLE PO  Take 1 tablet by mouth daily.     multivitamin capsule  Take 1 capsule by mouth daily.     PROBIOTIC DAILY Caps  Take 1 capsule by mouth daily.     triamcinolone cream 0.1 %  Commonly known as:  KENALOG  Apply 1 application topically 3 (three) times daily.     Turmeric 500 MG Tabs  Take 1 tablet by mouth daily.     Vitamin D3 5000 units Caps  Take 1 capsule by mouth daily.        Major procedures and Radiology Reports - PLEASE review detailed and final reports for all details, in brief -      Dg Chest 2 View  11/16/2015  CLINICAL DATA:  Fever. Sores generalized over the body and in the mouth. Lupus. EXAM: CHEST  2 VIEW COMPARISON:  Chest CT dated 07/25/2012. FINDINGS: Normal sized heart. Clear lungs. Mild central peribronchial thickening. Lower thoracic spine degenerative changes. Cholecystectomy clips. IMPRESSION: Mild bronchitic changes. Electronically Signed   By: Claudie Revering M.D.   On: 11/16/2015 15:54    Micro Results    Recent Results (from the past 240 hour(s))  Culture, blood (routine x 2)     Status: None (Preliminary result)   Collection Time: 11/16/15  4:04 PM  Result Value Ref Range Status   Specimen Description BLOOD RIGHT ANTECUBITAL  Final   Special Requests BOTTLES DRAWN AEROBIC AND ANAEROBIC 5CC  Final   Culture   Final    NO GROWTH < 24 HOURS Performed at [facility]    Report Status PENDING  Incomplete  Culture, blood (routine x 2)     Status: None (Preliminary result)   Collection Time: 11/16/15  4:04 PM  Result Value Ref Range Status   Specimen Description BLOOD RIGHT ANTECUBITAL  Final   Special Requests BOTTLES DRAWN AEROBIC AND ANAEROBIC 5CC  Final   Culture   Final    NO GROWTH < 24 HOURS Performed at [facility]    Report Status PENDING  Incomplete       Today   Subjective    [facility] patient  generalized pain, denies any shortness of breath or angina. He had developed a urinary retention and Foley catheter has been placed.  Objective   Blood pressure 120/65, pulse 93, temperature 99.2 F (37.3 C), temperature source Oral, resp.  rate 20, height 6' (1.829 m), weight 89.994 kg (198 lb 6.4 oz), SpO2 97 %.   Intake/Output Summary (Last 24 hours) at 11/17/15 1916 Last data filed at 11/17/15 1800  Gross per 24 hour  Intake   1065 ml  Output    750 ml  Net    315 ml    Exam Gen. Deconditioning ill-looking appearing Oral mucosa moist with swollen gums Chest. Lungs clear to auscultation bilaterally, heart S1-S2 present rhythmic Abdomen soft nontender Extremity no edema Skin. Diffuse ulcerative lesions in different stages with serous drainage, painful to palpation.   Data Review   CBC w Diff: Lab Results  Component Value Date   WBC 6.5 11/17/2015   HGB 9.6* 11/17/2015   HCT 28.4* 11/17/2015   PLT 100* 11/17/2015   LYMPHOPCT 16 11/17/2015   MONOPCT 7 11/17/2015   EOSPCT 0 11/17/2015   BASOPCT 0 11/17/2015    CMP: Lab Results  Component Value Date   NA 134* 11/17/2015   K 3.5 11/17/2015   CL 105 11/17/2015   CO2 22 11/17/2015   BUN 17 11/17/2015   CREATININE 1.06 11/17/2015   CREATININE 0.88 07/24/2015   PROT 6.5 11/17/2015   ALBUMIN 2.5* 11/17/2015   BILITOT 1.2 11/17/2015   ALKPHOS 126 11/17/2015   AST 143* 11/17/2015   ALT 42 11/17/2015  .   Total Time in preparing paper work, data evaluation and todays exam - 45 minutes  Tawni Millers M.D on 11/17/2015 at 7:16 PM  [facility]   Office  [phone]

## 2015-11-17 NOTE — Progress Notes (Signed)
1420  I was sitting at the computer next to 1422, when I saw a young male visitor looking around as if something was wrong.  I asked if I could help her.  She asked, are you a nurse? I said yes, I am a RN case manager may I help you or get someone to help you.  She was anxious and talking on her cell phone telling the person on the phone who I was.  She then said yes, she seemed afraid, can you talk to my father, pushing her cell phone towards me.  The person on the on the phone said, "I am Joanie Coddington, I am a councilman."  I asked which room and patient he was concerned about.  He and his daughter said [place], [place].  If you don't get someone in that room immediately, I will have so many people up there.  You will have more problems than you know.  Stephanie Coup was demanding to be called back in 30 mins by the director.  He spoke in a threatening manner, I have your my name.  I explained to Stephanie Coup that it will not be immediately,  I will pass this information on to the Unit Director.  His daughter was standing, shaking and very apologetic for father.

## 2015-11-17 NOTE — Consult Note (Signed)
WOC wound consult note Reason for Consult:Systemic lupus flare, multiple skin lesions the greatest of which is on the right knee, but which are over face/mouth, trunk and extremities, including feet. Petra Kuba of this consult exceeds the scope of [place] prectice. Recommend consultation with Dermatology.  If you agree, please consult. Patient is followed by Dr. Harriett Sine, Dermatology in the community. Wound type: Dermatological exacerbation of systemic autoimmune disease (lupus). Pressure Ulcer POA:No Measurement: Largest lesion is on the right knee, measures 5cm x 3.5cm x 0.1cm Wound bed:red, dry wound bed. Diffuse macules and ruptured blisters over most of body. Drainage (amount, consistency, odor) None at this time, although patient is relctant to remove his T-shirt as he believes dried serum (scabs) are attached to shirt. Periwound: There are few unaffected areas, but those are clean and dry.. Dressing procedure/placement/frequency: I have asked Nursing to bathe/cleanse the skin using either NS or our house pH balanced no rinse skin cleanser (EasiCleanse) and to inquire of the hospitalist if patient may shower as this may be the only way to remove the patient's T-Shirt. I will provide a therapeutic mattress with low air loss feature for comfort and to aid in moisture management.  Patient is taught about antimicrobial linen (DermaTherpay) and agrees to wear a therapeutic linen gown and use therapeutic linen bed linens. I will provide Eucerin Cream with ceramides until Dermatology can recommend/prescribe another topical intervention. [facility] nursing team will not follow, but will remain available to this patient, the nursing and medical teams.  Please re-consult if needed. Thanks, Maudie Flakes, MSN, RN, [place], Arther Abbott  Pager# [phone]

## 2015-11-17 NOTE — Progress Notes (Signed)
Pt has not voided since start of shift. Attempted to use urinal twice with not success. Bladder scan showed 600cc. MD made aware. Order to insert Foley. Callie Fielding RN

## 2015-11-17 NOTE — Progress Notes (Signed)
Office records received via fax [place] [facility]. Located on the front of chart.

## 2015-11-17 NOTE — Progress Notes (Addendum)
PROGRESS NOTE    Sean Maldonado  NOI:[id number] DOB: [birth year] DOA: 11/16/2015 PCP: Alesia Richards, MD   Brief Narrative:  Acute flare of cutaneous lupus. 60 yo male with diffuse skin lesions. Continue supportive care. May need immunosupressive therapy.  Assessment & Plan:   Active Problems:   Near syncope   Cutaneous Lupus   1. Cardiovascular. Patient hemodynamic stable, will continue to IV fluids, cell count at 6,5 with no signs of systemic infection.  2, Pulmonary. No signs of volume overload, patient oxygenating well, continue oxymetry monitor and supplemental 02 per [place] to target 02 sat above 92%  3, Nephrology. Cr at 1,05 with Na at 134, no clinical signs of lupus nephritis, will continue supportive care and IV fluids.  4. Skin. Severe cutaneous lupus, will continue local wound care, and supportive care. I spoke with Dr Amil Amen, rheumatology, patient may need pulse dose steroids, will plan to transfer patient to tertiary care for immunosupressive therapy. Patient has been accepted to [facility].   DVT prophylaxis: lovenox Code Status: full Family Communication: I have talk to patient's family at the bedside and all questions were addressed, key information for patient's care was obtained.  Disposition Plan:   Consultants:     Procedures:   Antimicrobials:    Subjective: Patient feeling better, pain improved control with morphine, no nausea or vomiting, no fever or chills.  Objective: Filed Vitals:   11/16/15 1851 11/16/15 2243 11/17/15 0526 11/17/15 1500  BP: 124/68 109/63 108/69 120/65  Pulse: 87 81 93   Temp: 99.4 F (37.4 C) 99.1 F (37.3 C) 100.1 F (37.8 C) 99.2 F (37.3 C)  TempSrc: Oral Oral Oral Oral  Resp: 18 18 20 20   Height: 6' (1.829 m)     Weight: 89.994 kg (198 lb 6.4 oz)     SpO2: 100% 97% 98% 97%    Intake/Output Summary (Last 24 hours) at 11/17/15 1737 Last data filed at 11/17/15 1501  Gross per 24 hour  Intake    1065 ml  Output    250 ml  Net    815 ml   Filed Weights   11/16/15 1346 11/16/15 1851  Weight: 90.719 kg (200 lb) 89.994 kg (198 lb 6.4 oz)    Examination:  General exam: ill looking appearing and deconditioned E ENT: oral mucosa with dry, no ulcers or rash Respiratory system: Clear to auscultation. Respiratory effort normal. Cardiovascular system: S1 & S2 heard, RRR. No JVD, murmurs, rubs, gallops or clicks. No pedal edema. Gastrointestinal system: Abdomen is nondistended, soft and nontender. No organomegaly or masses felt. Normal bowel sounds heard. Central nervous system: Alert and oriented. No focal neurological deficits. Extremities: Symmetric 5 x 5 power. Skin: multiple skin lesions diffuse at different stages, positive serous discharge, no pus noted. Psychiatry: Judgement and insight appear normal. Mood & affect appropriate.     Data Reviewed: I have personally reviewed following labs and imaging studies  CBC:  Recent Labs Lab 11/16/15 1510 11/17/15 0451  WBC 8.3 6.5  NEUTROABS 6.8 5.0  HGB 10.4* 9.6*  HCT 30.7* 28.4*  MCV 89.8 89.3  PLT 103* 694*   Basic Metabolic Panel:  Recent Labs Lab 11/16/15 1510 11/17/15 0451  NA 133* 134*  K 3.9 3.5  CL 101 105  CO2 20* 22  GLUCOSE 85 97  BUN 20 17  CREATININE 1.32* 1.06  CALCIUM 7.8* 7.4*   GFR: Estimated Creatinine Clearance: 83.4 mL/min (by C-G formula based on Cr of 1.06). Liver Function  Tests:  Recent Labs Lab 11/16/15 1510 11/17/15 0451  AST 95* 143*  ALT 31 42  ALKPHOS 66 126  BILITOT 1.1 1.2  PROT 6.8 6.5  ALBUMIN 2.8* 2.5*   No results for input(s): LIPASE, AMYLASE in the last 168 hours. No results for input(s): AMMONIA in the last 168 hours. Coagulation Profile: No results for input(s): INR, PROTIME in the last 168 hours. Cardiac Enzymes: No results for input(s): CKTOTAL, CKMB, CKMBINDEX, TROPONINI in the last 168 hours. BNP (last 3 results) No results for input(s): PROBNP in the  last 8760 hours. HbA1C: No results for input(s): HGBA1C in the last 72 hours. CBG: No results for input(s): GLUCAP in the last 168 hours. Lipid Profile: No results for input(s): CHOL, HDL, LDLCALC, TRIG, CHOLHDL, LDLDIRECT in the last 72 hours. Thyroid Function Tests: No results for input(s): TSH, T4TOTAL, FREET4, T3FREE, THYROIDAB in the last 72 hours. Anemia Panel: No results for input(s): VITAMINB12, FOLATE, FERRITIN, TIBC, IRON, RETICCTPCT in the last 72 hours. Sepsis Labs:  Recent Labs Lab 11/16/15 1533 11/16/15 1710  LATICACIDVEN 1.94* 1.17    Recent Results (from the past 240 hour(s))  Culture, blood (routine x 2)     Status: None (Preliminary result)   Collection Time: 11/16/15  4:04 PM  Result Value Ref Range Status   Specimen Description BLOOD RIGHT ANTECUBITAL  Final   Special Requests BOTTLES DRAWN AEROBIC AND ANAEROBIC 5CC  Final   Culture   Final    NO GROWTH < 24 HOURS Performed at [facility]    Report Status PENDING  Incomplete  Culture, blood (routine x 2)     Status: None (Preliminary result)   Collection Time: 11/16/15  4:04 PM  Result Value Ref Range Status   Specimen Description BLOOD RIGHT ANTECUBITAL  Final   Special Requests BOTTLES DRAWN AEROBIC AND ANAEROBIC 5CC  Final   Culture   Final    NO GROWTH < 24 HOURS Performed at [facility]    Report Status PENDING  Incomplete         Radiology Studies: Dg Chest 2 View  11/16/2015  CLINICAL DATA:  Fever. Sores generalized over the body and in the mouth. Lupus. EXAM: CHEST  2 VIEW COMPARISON:  Chest CT dated 07/25/2012. FINDINGS: Normal sized heart. Clear lungs. Mild central peribronchial thickening. Lower thoracic spine degenerative changes. Cholecystectomy clips. IMPRESSION: Mild bronchitic changes. Electronically Signed   By: Claudie Revering M.D.   On: 11/16/2015 15:54        Scheduled Meds: . enoxaparin (LOVENOX) injection  40 mg Subcutaneous Q24H  . famotidine (PEPCID) IV   20 mg Intravenous Q12H  . hydrocerin   Topical BID  . ondansetron (ZOFRAN) IV  4 mg Intravenous Q6H  . sodium chloride flush  3 mL Intravenous Q12H  . vitamin A & D       Continuous Infusions: . dextrose 5 % and 0.9% NaCl 100 mL/hr at 11/17/15 1501     LOS: 1 day       Sean Maldonado Gerome Apley, MD [facility] Pager [phone]  If 7PM-7AM, please contact night-coverage [email or web address] Password [facility] 11/17/2015, 5:37 PM

## 2015-11-17 NOTE — Progress Notes (Signed)
Foley placed early this afternoon. Pt is not agreeable to leg strap for FC. Attempt made to apply Eucerin cream to ulcerated areas on back and chest. Pt declined. Education provided regarding the benefits of theses interventions. Will continue to monitor

## 2015-11-17 NOTE — Progress Notes (Signed)
Pt had on a soiled tshirt under his hospital gown. Has wounds that are weeping. When pt was asked about removing the shirt and putting on a new gown, he refused to have the shirt removed. WOC RN came to see pt and finally convinced him to allow [place] to remove it and apply cream to the wounds. Callie Fielding RN

## 2015-11-18 DIAGNOSIS — R945 Abnormal results of liver function studies: Secondary | ICD-10-CM | POA: Diagnosis not present

## 2015-11-18 DIAGNOSIS — R918 Other nonspecific abnormal finding of lung field: Secondary | ICD-10-CM | POA: Diagnosis not present

## 2015-11-18 DIAGNOSIS — M3219 Other organ or system involvement in systemic lupus erythematosus: Secondary | ICD-10-CM | POA: Diagnosis not present

## 2015-11-18 DIAGNOSIS — N179 Acute kidney failure, unspecified: Secondary | ICD-10-CM | POA: Diagnosis not present

## 2015-11-18 DIAGNOSIS — D638 Anemia in other chronic diseases classified elsewhere: Secondary | ICD-10-CM | POA: Diagnosis not present

## 2015-11-18 DIAGNOSIS — R740 Nonspecific elevation of levels of transaminase and lactic acid dehydrogenase [LDH]: Secondary | ICD-10-CM | POA: Diagnosis not present

## 2015-11-18 DIAGNOSIS — M329 Systemic lupus erythematosus, unspecified: Secondary | ICD-10-CM | POA: Diagnosis not present

## 2015-11-18 DIAGNOSIS — I714 Abdominal aortic aneurysm, without rupture: Secondary | ICD-10-CM | POA: Diagnosis not present

## 2015-11-18 DIAGNOSIS — I10 Essential (primary) hypertension: Secondary | ICD-10-CM | POA: Diagnosis not present

## 2015-11-18 DIAGNOSIS — R7989 Other specified abnormal findings of blood chemistry: Secondary | ICD-10-CM | POA: Diagnosis not present

## 2015-11-18 DIAGNOSIS — J9601 Acute respiratory failure with hypoxia: Secondary | ICD-10-CM | POA: Diagnosis not present

## 2015-11-18 DIAGNOSIS — L988 Other specified disorders of the skin and subcutaneous tissue: Secondary | ICD-10-CM | POA: Diagnosis not present

## 2015-11-18 DIAGNOSIS — R400 Somnolence: Secondary | ICD-10-CM | POA: Diagnosis not present

## 2015-11-18 DIAGNOSIS — H01129 Discoid lupus erythematosus of unspecified eye, unspecified eyelid: Secondary | ICD-10-CM | POA: Diagnosis not present

## 2015-11-18 DIAGNOSIS — D649 Anemia, unspecified: Secondary | ICD-10-CM | POA: Diagnosis not present

## 2015-11-18 DIAGNOSIS — L930 Discoid lupus erythematosus: Secondary | ICD-10-CM | POA: Diagnosis not present

## 2015-11-18 LAB — EXTRACTABLE NUCLEAR ANTIGEN ANTIBODY
RIBONUCLEIC PROTEIN: 3.4 AI — AB (ref 0.0–0.9)
Scleroderma (Scl-70) (ENA) Antibody, IgG: <0.2 AI (ref 0.0–0.9)

## 2015-11-18 LAB — C4 COMPLEMENT: Complement C4, Body Fluid: <2 mg/dL — ABNORMAL LOW (ref 14–44)

## 2015-11-18 LAB — ANTI-JO 1 ANTIBODY, IGG: Anti JO-1: <0.2 AI (ref 0.0–0.9)

## 2015-11-18 LAB — C3 COMPLEMENT: C3 COMPLEMENT: 17 mg/dL — AB (ref 82–167)

## 2015-11-18 MED FILL — Fentanyl Citrate Preservative Free (PF) Inj 100 MCG/2ML: INTRAMUSCULAR | Qty: 2 | Status: AC

## 2015-11-18 MED FILL — Ondansetron HCl Inj 4 MG/2ML (2 MG/ML): INTRAMUSCULAR | Qty: 2 | Status: AC

## 2015-11-18 NOTE — Progress Notes (Signed)
Writer received call from [place] at [facility] saying patient had a bed and could be transferred tonight, and requested a set of VS. VS obtained and Tye [place] recalled and given patient's current VS, Cathy told Probation officer patient's bed information and phone number to call report to on the receiving unit. NP on call notified of patients current VS and new orders given and followed. [facility] called and transportation arranged. Report given to [facility], RN, the receiving units charge nurse. [facility] called and given report. [facility] arrived and patient transported in no distress to [facility]. Joanie Coddington, patient's HCPOA called and updated as requested.

## 2015-11-19 DIAGNOSIS — I4510 Unspecified right bundle-branch block: Secondary | ICD-10-CM | POA: Diagnosis not present

## 2015-11-19 DIAGNOSIS — M329 Systemic lupus erythematosus, unspecified: Secondary | ICD-10-CM | POA: Diagnosis not present

## 2015-11-19 DIAGNOSIS — A419 Sepsis, unspecified organism: Secondary | ICD-10-CM | POA: Diagnosis not present

## 2015-11-19 DIAGNOSIS — N179 Acute kidney failure, unspecified: Secondary | ICD-10-CM | POA: Diagnosis not present

## 2015-11-19 DIAGNOSIS — R7989 Other specified abnormal findings of blood chemistry: Secondary | ICD-10-CM | POA: Diagnosis not present

## 2015-11-19 DIAGNOSIS — L930 Discoid lupus erythematosus: Secondary | ICD-10-CM | POA: Diagnosis not present

## 2015-11-19 DIAGNOSIS — J9601 Acute respiratory failure with hypoxia: Secondary | ICD-10-CM | POA: Diagnosis not present

## 2015-11-20 DIAGNOSIS — D696 Thrombocytopenia, unspecified: Secondary | ICD-10-CM | POA: Diagnosis not present

## 2015-11-20 DIAGNOSIS — R21 Rash and other nonspecific skin eruption: Secondary | ICD-10-CM | POA: Diagnosis not present

## 2015-11-20 DIAGNOSIS — J9691 Respiratory failure, unspecified with hypoxia: Secondary | ICD-10-CM | POA: Diagnosis not present

## 2015-11-20 DIAGNOSIS — M329 Systemic lupus erythematosus, unspecified: Secondary | ICD-10-CM | POA: Diagnosis not present

## 2015-11-20 DIAGNOSIS — R7989 Other specified abnormal findings of blood chemistry: Secondary | ICD-10-CM | POA: Diagnosis not present

## 2015-11-20 DIAGNOSIS — N179 Acute kidney failure, unspecified: Secondary | ICD-10-CM | POA: Diagnosis not present

## 2015-11-20 DIAGNOSIS — R760 Raised antibody titer: Secondary | ICD-10-CM | POA: Diagnosis not present

## 2015-11-21 DIAGNOSIS — D638 Anemia in other chronic diseases classified elsewhere: Secondary | ICD-10-CM | POA: Diagnosis not present

## 2015-11-21 DIAGNOSIS — M329 Systemic lupus erythematosus, unspecified: Secondary | ICD-10-CM | POA: Diagnosis not present

## 2015-11-21 DIAGNOSIS — L988 Other specified disorders of the skin and subcutaneous tissue: Secondary | ICD-10-CM | POA: Diagnosis not present

## 2015-11-21 DIAGNOSIS — K598 Other specified functional intestinal disorders: Secondary | ICD-10-CM | POA: Diagnosis not present

## 2015-11-21 DIAGNOSIS — I10 Essential (primary) hypertension: Secondary | ICD-10-CM | POA: Diagnosis not present

## 2015-11-21 DIAGNOSIS — R590 Localized enlarged lymph nodes: Secondary | ICD-10-CM | POA: Diagnosis not present

## 2015-11-21 DIAGNOSIS — L930 Discoid lupus erythematosus: Secondary | ICD-10-CM | POA: Diagnosis not present

## 2015-11-21 DIAGNOSIS — R918 Other nonspecific abnormal finding of lung field: Secondary | ICD-10-CM | POA: Diagnosis not present

## 2015-11-21 DIAGNOSIS — M3219 Other organ or system involvement in systemic lupus erythematosus: Secondary | ICD-10-CM | POA: Diagnosis not present

## 2015-11-21 LAB — CULTURE, BLOOD (ROUTINE X 2)
Culture: NO GROWTH
Culture: NO GROWTH

## 2015-11-22 DIAGNOSIS — L988 Other specified disorders of the skin and subcutaneous tissue: Secondary | ICD-10-CM | POA: Diagnosis not present

## 2015-11-22 DIAGNOSIS — D638 Anemia in other chronic diseases classified elsewhere: Secondary | ICD-10-CM | POA: Diagnosis not present

## 2015-11-22 DIAGNOSIS — I10 Essential (primary) hypertension: Secondary | ICD-10-CM | POA: Diagnosis not present

## 2015-11-22 DIAGNOSIS — M329 Systemic lupus erythematosus, unspecified: Secondary | ICD-10-CM | POA: Diagnosis not present

## 2015-11-22 DIAGNOSIS — D696 Thrombocytopenia, unspecified: Secondary | ICD-10-CM | POA: Diagnosis not present

## 2015-11-22 DIAGNOSIS — R760 Raised antibody titer: Secondary | ICD-10-CM | POA: Diagnosis not present

## 2015-11-22 DIAGNOSIS — R7989 Other specified abnormal findings of blood chemistry: Secondary | ICD-10-CM | POA: Diagnosis not present

## 2015-11-22 DIAGNOSIS — R21 Rash and other nonspecific skin eruption: Secondary | ICD-10-CM | POA: Diagnosis not present

## 2015-11-23 DIAGNOSIS — R634 Abnormal weight loss: Secondary | ICD-10-CM | POA: Diagnosis not present

## 2015-11-23 DIAGNOSIS — L930 Discoid lupus erythematosus: Secondary | ICD-10-CM | POA: Diagnosis not present

## 2015-11-23 DIAGNOSIS — D638 Anemia in other chronic diseases classified elsewhere: Secondary | ICD-10-CM | POA: Diagnosis not present

## 2015-11-23 DIAGNOSIS — L988 Other specified disorders of the skin and subcutaneous tissue: Secondary | ICD-10-CM | POA: Diagnosis not present

## 2015-11-23 DIAGNOSIS — M3219 Other organ or system involvement in systemic lupus erythematosus: Secondary | ICD-10-CM | POA: Diagnosis not present

## 2015-11-23 DIAGNOSIS — M329 Systemic lupus erythematosus, unspecified: Secondary | ICD-10-CM | POA: Diagnosis not present

## 2015-11-23 DIAGNOSIS — I10 Essential (primary) hypertension: Secondary | ICD-10-CM | POA: Diagnosis not present

## 2015-11-23 DIAGNOSIS — D696 Thrombocytopenia, unspecified: Secondary | ICD-10-CM | POA: Diagnosis not present

## 2015-11-24 DIAGNOSIS — M329 Systemic lupus erythematosus, unspecified: Secondary | ICD-10-CM | POA: Diagnosis not present

## 2015-11-24 DIAGNOSIS — L988 Other specified disorders of the skin and subcutaneous tissue: Secondary | ICD-10-CM | POA: Diagnosis not present

## 2015-11-24 DIAGNOSIS — D638 Anemia in other chronic diseases classified elsewhere: Secondary | ICD-10-CM | POA: Diagnosis not present

## 2015-11-24 DIAGNOSIS — I10 Essential (primary) hypertension: Secondary | ICD-10-CM | POA: Diagnosis not present

## 2015-11-24 DIAGNOSIS — Z7952 Long term (current) use of systemic steroids: Secondary | ICD-10-CM | POA: Diagnosis not present

## 2015-11-24 DIAGNOSIS — M3219 Other organ or system involvement in systemic lupus erythematosus: Secondary | ICD-10-CM | POA: Diagnosis not present

## 2015-12-01 ENCOUNTER — Ambulatory Visit: Payer: Self-pay | Admitting: Internal Medicine

## 2015-12-10 ENCOUNTER — Other Ambulatory Visit: Payer: Self-pay | Admitting: *Deleted

## 2015-12-10 MED ORDER — CITALOPRAM HYDROBROMIDE 40 MG PO TABS: 40.0000 mg | ORAL_TABLET | Freq: Every day | ORAL | 1 refills | Status: DC

## 2015-12-10 MED ORDER — ALPRAZOLAM 0.5 MG PO TABS: 0.5000 mg | ORAL_TABLET | Freq: Every evening | ORAL | 0 refills | Status: DC | PRN

## 2015-12-21 DIAGNOSIS — R590 Localized enlarged lymph nodes: Secondary | ICD-10-CM | POA: Diagnosis not present

## 2015-12-21 DIAGNOSIS — Z79899 Other long term (current) drug therapy: Secondary | ICD-10-CM | POA: Diagnosis not present

## 2015-12-21 DIAGNOSIS — M659 Synovitis and tenosynovitis, unspecified: Secondary | ICD-10-CM | POA: Diagnosis not present

## 2015-12-21 DIAGNOSIS — Z7982 Long term (current) use of aspirin: Secondary | ICD-10-CM | POA: Diagnosis not present

## 2015-12-21 DIAGNOSIS — M329 Systemic lupus erythematosus, unspecified: Secondary | ICD-10-CM | POA: Diagnosis not present

## 2015-12-21 DIAGNOSIS — L930 Discoid lupus erythematosus: Secondary | ICD-10-CM | POA: Diagnosis not present

## 2015-12-21 DIAGNOSIS — D696 Thrombocytopenia, unspecified: Secondary | ICD-10-CM | POA: Diagnosis not present

## 2016-01-08 ENCOUNTER — Ambulatory Visit (INDEPENDENT_AMBULATORY_CARE_PROVIDER_SITE_OTHER): Payer: BLUE CROSS/BLUE SHIELD | Admitting: Physician Assistant

## 2016-01-08 ENCOUNTER — Encounter: Payer: Self-pay | Admitting: Physician Assistant

## 2016-01-08 VITALS — BP 122/82 | HR 68 | Temp 98.1°F | Resp 16 | Ht 71.75 in | Wt 206.2 lb

## 2016-01-08 DIAGNOSIS — E782 Mixed hyperlipidemia: Secondary | ICD-10-CM | POA: Diagnosis not present

## 2016-01-08 DIAGNOSIS — R7303 Prediabetes: Secondary | ICD-10-CM

## 2016-01-08 DIAGNOSIS — I10 Essential (primary) hypertension: Secondary | ICD-10-CM | POA: Diagnosis not present

## 2016-01-08 DIAGNOSIS — L509 Urticaria, unspecified: Secondary | ICD-10-CM | POA: Diagnosis not present

## 2016-01-08 DIAGNOSIS — Z79899 Other long term (current) drug therapy: Secondary | ICD-10-CM | POA: Diagnosis not present

## 2016-01-08 DIAGNOSIS — E559 Vitamin D deficiency, unspecified: Secondary | ICD-10-CM

## 2016-01-08 LAB — CBC WITH DIFFERENTIAL/PLATELET
Basophils Absolute: 0 cells/uL (ref 0–200)
Basophils Relative: 0 %
EOS PCT: 2 %
Eosinophils Absolute: 96 cells/uL (ref 15–500)
HCT: 35.3 % — ABNORMAL LOW (ref 38.5–50.0)
HEMOGLOBIN: 11.6 g/dL — AB (ref 13.2–17.1)
LYMPHS ABS: 1056 {cells}/uL (ref 850–3900)
Lymphocytes Relative: 22 %
MCH: 30.3 pg (ref 27.0–33.0)
MCHC: 32.9 g/dL (ref 32.0–36.0)
MCV: 92.2 fL (ref 80.0–100.0)
MONO ABS: 624 {cells}/uL (ref 200–950)
MPV: 9.3 fL (ref 7.5–12.5)
Monocytes Relative: 13 %
NEUTROS ABS: 3024 {cells}/uL (ref 1500–7800)
Neutrophils Relative %: 63 %
Platelets: 324 10*3/uL (ref 140–400)
RBC: 3.83 MIL/uL — AB (ref 4.20–5.80)
RDW: 15.9 % — ABNORMAL HIGH (ref 11.0–15.0)
WBC: 4.8 10*3/uL (ref 3.8–10.8)

## 2016-01-08 LAB — BASIC METABOLIC PANEL WITH GFR
BUN: 10 mg/dL (ref 7–25)
CALCIUM: 8.4 mg/dL — AB (ref 8.6–10.3)
CHLORIDE: 104 mmol/L (ref 98–110)
CO2: 29 mmol/L (ref 20–31)
Creat: 0.94 mg/dL (ref 0.70–1.33)
GFR, EST NON AFRICAN AMERICAN: 88 mL/min (ref >=60)
GFR, Est African American: >89 mL/min (ref >=60)
Glucose, Bld: 84 mg/dL (ref 65–99)
POTASSIUM: 4.4 mmol/L (ref 3.5–5.3)
SODIUM: 141 mmol/L (ref 135–146)

## 2016-01-08 LAB — HEPATIC FUNCTION PANEL
ALT: 17 U/L (ref 9–46)
AST: 33 U/L (ref 10–35)
Albumin: 3.6 g/dL (ref 3.6–5.1)
Alkaline Phosphatase: 62 U/L (ref 40–115)
BILIRUBIN DIRECT: 0.1 mg/dL (ref <=0.2)
Indirect Bilirubin: 0.2 mg/dL (ref 0.2–1.2)
Total Bilirubin: 0.3 mg/dL (ref 0.2–1.2)
Total Protein: 6.7 g/dL (ref 6.1–8.1)

## 2016-01-08 LAB — TSH: TSH: 2.1 mIU/L (ref 0.40–4.50)

## 2016-01-08 LAB — LIPID PANEL
CHOL/HDL RATIO: 4.8 ratio (ref <=5.0)
CHOLESTEROL: 201 mg/dL — AB (ref 125–200)
HDL: 42 mg/dL (ref >=40)
LDL Cholesterol: 143 mg/dL — ABNORMAL HIGH (ref <130)
Triglycerides: 82 mg/dL (ref <150)
VLDL: 16 mg/dL (ref <30)

## 2016-01-08 LAB — MAGNESIUM: Magnesium: 1.8 mg/dL (ref 1.5–2.5)

## 2016-01-08 MED ORDER — PREDNISONE 20 MG PO TABS: ORAL_TABLET | ORAL | 0 refills | Status: AC

## 2016-01-08 MED ORDER — HYDROXYZINE HCL 25 MG PO TABS: 50.0000 mg | ORAL_TABLET | Freq: Three times a day (TID) | ORAL | 0 refills | Status: DC | PRN

## 2016-01-08 NOTE — Progress Notes (Signed)
Subjective:    Patient ID: Sean Maldonado, male    DOB: 10-Dec-1955, 60 y.o.   MRN: [id number]  HPI 60 y.o. WM presents after follow up from the hospital for fever and discoid lupus, he was transferred from [facility] to [facility]. He is currently following with Dr. Veneta Penton at [facility] and he is tapering off prednisone and on plaquenil 23m BID. He started to get erythematous papules very pruritic on bilateral hand/arms, now starting on AB and legs. His CBC was better from 7.7 to 10.4, platelets went from 156 to 362.   Blood pressure 122/82, pulse 68, temperature 98.1 F (36.7 C), resp. rate 16, height 5' 11.75" (1.822 m), weight 206 lb 3.2 oz (93.5 kg), SpO2 99 %.  Medications Current Outpatient Prescriptions on File Prior to Visit  Medication Sig  . ALPRAZolam (XANAX) 0.5 MG tablet Take 1 tablet (0.5 mg total) by mouth at bedtime as needed for anxiety.  .Marland Kitchenaspirin 81 MG tablet Take 81 mg by mouth daily.  .Marland Kitchenatenolol (TENORMIN) 100 MG tablet TAKE 1 TABLET DAILY FOR BLOOD PRESSURE.  .Marland KitchenCholecalciferol (VITAMIN D3) 5000 UNITS CAPS Take 1 capsule by mouth daily.  . citalopram (CELEXA) 40 MG tablet Take 1 tablet (40 mg total) by mouth daily.  . Coenzyme Q10 (CO Q 10 PO) Take 1 tablet by mouth daily.  .Marland KitchenMILK THISTLE PO Take 1 tablet by mouth daily.  . Multiple Vitamin (MULTIVITAMIN) capsule Take 1 capsule by mouth daily.  . Omega-3 Fatty Acids (FISH OIL PO) Take 1 capsule by mouth daily.  . Probiotic Product (PROBIOTIC DAILY) CAPS Take 1 capsule by mouth daily.  .Marland Kitchentriamcinolone cream (KENALOG) 0.1 % Apply 1 application topically 3 (three) times daily.  . Turmeric 500 MG TABS Take 1 tablet by mouth daily.  . [DISCONTINUED] fenofibrate micronized (LOFIBRA) 134 MG capsule Take 134 mg by mouth daily.   No current facility-administered medications on file prior to visit.     Problem list He has GERD (gastroesophageal reflux disease); NASH (nonalcoholic steatohepatitis); Gastritis, acute with hemorrhage;  Asthma; Seasonal allergies; Hyperlipidemia; Hypertension; Prediabetes; Vitamin D deficiency; Medication management; Morbid obesity (BMI 33.37); Gout; BMI 33.0-33.9,adult; Near syncope; and Lupus (systemic lupus erythematosus) ([place] problem [place].  Review of Systems  Constitutional: Negative.  Negative for chills and fever.  HENT: Positive for congestion and rhinorrhea. Negative for sore throat and trouble swallowing.   Eyes: Negative.   Respiratory: Negative.   Cardiovascular: Negative.   Gastrointestinal: Negative.   Genitourinary: Negative.   Musculoskeletal: Negative.   Skin: Positive for rash (on bilateral hands/arms/knees/AB).  Neurological: Negative.   Psychiatric/Behavioral: Negative.        Objective:   Physical Exam  Constitutional: He is oriented to person, place, and time. He appears well-developed and well-nourished. No distress.  HENT:  Head: Normocephalic.  Right Ear: External ear normal.  Left Ear: External ear normal.  Nose: Nose normal.  Mouth/Throat: Oropharynx is clear and moist. No oropharyngeal exudate (no ulcers).  Eyes: Conjunctivae are normal. Pupils are equal, round, and reactive to light.  Neck: Normal range of motion. Neck supple.  Cardiovascular: Normal rate and regular rhythm.   Pulmonary/Chest: Effort normal and breath sounds normal.  Abdominal: Soft. Bowel sounds are normal. There is no tenderness.  Musculoskeletal: Normal range of motion. He exhibits no tenderness.  Lymphadenopathy:    He has no cervical adenopathy.  Neurological: He is alert and oriented to person, place, and time. No cranial nerve deficit.  Skin: Rash (  bilateral hands to elbow with erythematous maculopapular rash with some swelling and erythema bilateral hands, bilateral  knees with macularpapular rash and AB with the same, except some larger plaques with ulceration on chest) noted. There is erythema.       Assessment & Plan:  Maculopapular rash with pred taper ? Drug  eruption since coming off prednisone, will increase prednisone taper over the weekend, call Dr. Faythe Ghee appointment, may need to switch plaquenil, continue 48m dose for now, atrax for itching, check CBC, etc kidney, liver

## 2016-01-09 LAB — HEMOGLOBIN A1C
Hgb A1c MFr Bld: 5 % (ref <5.7)
MEAN PLASMA GLUCOSE: 97 mg/dL

## 2016-01-09 LAB — VITAMIN D 25 HYDROXY (VIT D DEFICIENCY, FRACTURES): Vit D, 25-Hydroxy: 63 ng/mL (ref 30–100)

## 2016-01-22 ENCOUNTER — Ambulatory Visit: Payer: Self-pay | Admitting: Internal Medicine

## 2016-01-29 ENCOUNTER — Ambulatory Visit: Payer: Self-pay | Admitting: Internal Medicine

## 2016-03-14 ENCOUNTER — Other Ambulatory Visit: Payer: Self-pay | Admitting: Internal Medicine

## 2016-03-14 DIAGNOSIS — Z79899 Other long term (current) drug therapy: Secondary | ICD-10-CM | POA: Diagnosis not present

## 2016-03-14 DIAGNOSIS — J45909 Unspecified asthma, uncomplicated: Secondary | ICD-10-CM | POA: Diagnosis not present

## 2016-03-14 DIAGNOSIS — I10 Essential (primary) hypertension: Secondary | ICD-10-CM | POA: Diagnosis not present

## 2016-03-14 DIAGNOSIS — M329 Systemic lupus erythematosus, unspecified: Secondary | ICD-10-CM | POA: Diagnosis not present

## 2016-03-14 DIAGNOSIS — Z23 Encounter for immunization: Secondary | ICD-10-CM | POA: Diagnosis not present

## 2016-03-14 DIAGNOSIS — R590 Localized enlarged lymph nodes: Secondary | ICD-10-CM | POA: Diagnosis not present

## 2016-03-14 DIAGNOSIS — E7800 Pure hypercholesterolemia, unspecified: Secondary | ICD-10-CM | POA: Diagnosis not present

## 2016-05-13 ENCOUNTER — Encounter: Payer: Self-pay | Admitting: Internal Medicine

## 2016-05-13 ENCOUNTER — Ambulatory Visit (INDEPENDENT_AMBULATORY_CARE_PROVIDER_SITE_OTHER): Payer: BLUE CROSS/BLUE SHIELD | Admitting: Internal Medicine

## 2016-05-13 VITALS — BP 100/60 | HR 72 | Temp 98.2°F | Resp 16 | Ht 71.75 in | Wt 230.0 lb

## 2016-05-13 DIAGNOSIS — R7303 Prediabetes: Secondary | ICD-10-CM

## 2016-05-13 DIAGNOSIS — L930 Discoid lupus erythematosus: Secondary | ICD-10-CM | POA: Diagnosis not present

## 2016-05-13 DIAGNOSIS — E782 Mixed hyperlipidemia: Secondary | ICD-10-CM

## 2016-05-13 DIAGNOSIS — E559 Vitamin D deficiency, unspecified: Secondary | ICD-10-CM | POA: Diagnosis not present

## 2016-05-13 DIAGNOSIS — Z79899 Other long term (current) drug therapy: Secondary | ICD-10-CM | POA: Diagnosis not present

## 2016-05-13 DIAGNOSIS — I10 Essential (primary) hypertension: Secondary | ICD-10-CM

## 2016-05-13 LAB — LIPID PANEL
CHOLESTEROL: 187 mg/dL (ref <200)
HDL: 28 mg/dL — ABNORMAL LOW (ref >40)
LDL Cholesterol: 142 mg/dL — ABNORMAL HIGH (ref <100)
Total CHOL/HDL Ratio: 6.7 Ratio — ABNORMAL HIGH (ref <5.0)
Triglycerides: 86 mg/dL (ref <150)
VLDL: 17 mg/dL (ref <30)

## 2016-05-13 LAB — BASIC METABOLIC PANEL WITH GFR
BUN: 17 mg/dL (ref 7–25)
CHLORIDE: 102 mmol/L (ref 98–110)
CO2: 27 mmol/L (ref 20–31)
CREATININE: 1.14 mg/dL (ref 0.70–1.33)
Calcium: 9.3 mg/dL (ref 8.6–10.3)
GFR, Est African American: 81 mL/min (ref >=60)
GFR, Est Non African American: 70 mL/min (ref >=60)
GLUCOSE: 94 mg/dL (ref 65–99)
Potassium: 4.5 mmol/L (ref 3.5–5.3)
Sodium: 138 mmol/L (ref 135–146)

## 2016-05-13 LAB — HEMOGLOBIN A1C
Hgb A1c MFr Bld: 5.4 % (ref <5.7)
MEAN PLASMA GLUCOSE: 108 mg/dL

## 2016-05-13 LAB — CBC WITH DIFFERENTIAL/PLATELET
BASOS ABS: 0 {cells}/uL (ref 0–200)
Basophils Relative: 0 %
EOS ABS: 280 {cells}/uL (ref 15–500)
Eosinophils Relative: 5 %
HCT: 36.3 % — ABNORMAL LOW (ref 38.5–50.0)
HEMOGLOBIN: 12.2 g/dL — AB (ref 13.2–17.1)
LYMPHS ABS: 952 {cells}/uL (ref 850–3900)
Lymphocytes Relative: 17 %
MCH: 30.6 pg (ref 27.0–33.0)
MCHC: 33.6 g/dL (ref 32.0–36.0)
MCV: 91 fL (ref 80.0–100.0)
MPV: 9.8 fL (ref 7.5–12.5)
Monocytes Absolute: 784 cells/uL (ref 200–950)
Monocytes Relative: 14 %
NEUTROS ABS: 3584 {cells}/uL (ref 1500–7800)
NEUTROS PCT: 64 %
Platelets: 269 10*3/uL (ref 140–400)
RBC: 3.99 MIL/uL — ABNORMAL LOW (ref 4.20–5.80)
RDW: 13.5 % (ref 11.0–15.0)
WBC: 5.6 10*3/uL (ref 3.8–10.8)

## 2016-05-13 LAB — TSH: TSH: 3.24 m[IU]/L (ref 0.40–4.50)

## 2016-05-13 LAB — HEPATIC FUNCTION PANEL
ALBUMIN: 3.9 g/dL (ref 3.6–5.1)
ALT: 22 U/L (ref 9–46)
AST: 35 U/L (ref 10–35)
Alkaline Phosphatase: 54 U/L (ref 40–115)
Bilirubin, Direct: 0.1 mg/dL (ref <=0.2)
Indirect Bilirubin: 0.5 mg/dL (ref 0.2–1.2)
TOTAL PROTEIN: 7.6 g/dL (ref 6.1–8.1)
Total Bilirubin: 0.6 mg/dL (ref 0.2–1.2)

## 2016-05-13 NOTE — Progress Notes (Signed)
Assessment and Plan:  Hypertension:  -cut lisinopril in half -cont half dose of atenolol -if BP at lunch is 150/90 or higher take second half of lisinopril -Continue medication,  -monitor blood pressure at home.  -Continue DASH diet.   -Reminder to go to the ER if any CP, SOB, nausea, dizziness, severe HA, changes vision/speech, left arm numbness and tingling, and jaw pain.  Cholesterol: -not on medication -recommended higher fiber diet and use of benefiber -Continue diet and exercise.  -Check cholesterol.   Pre-diabetes: -Continue diet and exercise.  -Check A1C  Vitamin D Def: -check level -continue medications.   Lupus -has been well controlled with his current routine -BMET for kidney functions -is being followed by [facility] and meds as discussed. Further disposition pending results of labs.  HPI 61 y.o. male  presents for 3 month follow up with hypertension, hyperlipidemia, prediabetes and vitamin D.   His blood pressure has been controlled at home, today their BP is BP: 100/60.   He does workout. He denies chest pain, shortness of breath, dizziness.  He reports that there was a period of time last year where it went up tremendously and now it is running really low again.  He reports that he is taking 1/2 tablet of atenolol and full tablet of lisinopril.  He checks BP at home.  He reports that he doesn't know what to do.     He is on cholesterol medication and denies myalgias. His cholesterol is at goal. The cholesterol last visit was:   Lab Results  Component Value Date   CHOL 201 (H) 01/08/2016   HDL 42 01/08/2016   LDLCALC 143 (H) 01/08/2016   TRIG 82 01/08/2016   CHOLHDL 4.8 01/08/2016     He has been working on diet and exercise for prediabetes, and denies foot ulcerations, hyperglycemia, hypoglycemia , increased appetite, nausea, paresthesia of the feet, polydipsia, polyuria, visual disturbances, vomiting and weight loss. Last A1C in the office  was:  Lab Results  Component Value Date   HGBA1C 5.0 01/08/2016    Patient is on Vitamin D supplement.  Lab Results  Component Value Date   VD25OH 38 01/08/2016      He reports that he has been having treatments of his lupus at [facility].  He reports that it is starting to get a little bit better.  He is on the plaquinil.  He is going once every 3-4 months.  He reports that he reports that it took 4-5 months for it to work.  He reports that he still gets fatigued.    Current Medications:  Current Outpatient Prescriptions on File Prior to Visit  Medication Sig Dispense Refill  . ALPRAZolam (XANAX) 0.5 MG tablet Take 1 tablet (0.5 mg total) by mouth at bedtime as needed for anxiety. 90 tablet 0  . aspirin 81 MG tablet Take 81 mg by mouth daily.    Marland Kitchen atenolol (TENORMIN) 100 MG tablet TAKE 1 TABLET DAILY FOR BLOOD PRESSURE. 90 tablet 0  . Cholecalciferol (VITAMIN D3) 5000 UNITS CAPS Take 1 capsule by mouth daily.    . citalopram (CELEXA) 40 MG tablet Take 1 tablet (40 mg total) by mouth daily. 90 tablet 1  . Coenzyme Q10 (CO Q 10 PO) Take 1 tablet by mouth daily.    . hydroxychloroquine (PLAQUENIL) 200 MG tablet Take 200 mg by mouth 2 (two) times daily.    . hydrOXYzine (ATARAX/VISTARIL) 25 MG tablet Take 2 tablets (50 mg total) by  mouth 3 (three) times daily as needed for itching. 60 tablet 0  . MILK THISTLE PO Take 1 tablet by mouth daily.    . Multiple Vitamin (MULTIVITAMIN) capsule Take 1 capsule by mouth daily.    . Omega-3 Fatty Acids (FISH OIL PO) Take 1 capsule by mouth daily.    . Probiotic Product (PROBIOTIC DAILY) CAPS Take 1 capsule by mouth daily.    Marland Kitchen triamcinolone cream (KENALOG) 0.1 % Apply 1 application topically 3 (three) times daily. 30 g 0  . Turmeric 500 MG TABS Take 1 tablet by mouth daily.    . [DISCONTINUED] fenofibrate micronized (LOFIBRA) 134 MG capsule Take 134 mg by mouth daily.     No current facility-administered medications on file prior to visit.      Medical History:  Past Medical History:  Diagnosis Date  . Arthritis    pt denies  . Asthma   . Cholelithiasis   . Discoid lupus   . Dysrhythmia    RBBB-incomplete  . Fatty liver   . GERD (gastroesophageal reflux disease)   . Hepatic steatosis   . Hypercholesteremia   . Hypertension   . Obesity   . Prediabetes   . Seasonal allergies   . Vitamin D deficiency     Allergies:  Allergies  Allergen Reactions  . Plaquenil [Hydroxychloroquine] Other (See Comments)    Skin lupas   . Lipitor [Atorvastatin]     Myalgias   . Zocor [Simvastatin]     Myalgias     Review of Systems:  Review of Systems  Constitutional: Positive for malaise/fatigue. Negative for chills and fever.  HENT: Negative for congestion, ear pain and sore throat.   Eyes: Negative.   Respiratory: Negative for cough, shortness of breath and wheezing.   Cardiovascular: Negative for chest pain, palpitations and leg swelling.  Gastrointestinal: Negative for abdominal pain, blood in stool, constipation, diarrhea, heartburn and melena.  Genitourinary: Negative.   Skin: Positive for rash.  Neurological: Negative for dizziness, sensory change, loss of consciousness and headaches.  Psychiatric/Behavioral: Negative for depression. The patient is not nervous/anxious and does not have insomnia.     Family history- Review and unchanged  Social history- Review and unchanged  Physical Exam: BP 100/60   Pulse 72   Temp 98.2 F (36.8 C) (Temporal)   Resp 16   Ht 5' 11.75" (1.822 m)   Wt 230 lb (104.3 kg)   BMI 31.41 kg/m  Wt Readings from Last 3 Encounters:  05/13/16 230 lb (104.3 kg)  01/08/16 206 lb 3.2 oz (93.5 kg)  11/16/15 198 lb 6.4 oz (90 kg)    General Appearance: Well nourished well developed, in no apparent distress. Eyes: PERRLA, EOMs, conjunctiva no swelling or erythema ENT/Mouth: Ear canals normal without obstruction, swelling, erythma, discharge.  TMs normal bilaterally.  Oropharynx moist,  clear, without exudate, or postoropharyngeal swelling. Neck: Supple, thyroid normal,no cervical adenopathy  Respiratory: Respiratory effort normal, Breath sounds clear A&P without rhonchi, wheeze, or rale.  No retractions, no accessory usage. Cardio: RRR with no MRGs. Brisk peripheral pulses without edema.  Abdomen: Soft, + BS,  Non tender, no guarding, rebound, hernias, masses. Musculoskeletal: Full ROM, 5/5 strength, Normal gait Skin: Warm, dry without rashes, lesions, ecchymosis. Dry cracked skin to the bilateral hands with some scaling and desquamation.  Hypopigmented scarring to the neck and the face from prior rash. Neuro: Awake and oriented X 3, Cranial nerves intact. Normal muscle tone, no cerebellar symptoms. Psych: Normal affect, Insight and Judgment  appropriate.    Starlyn Skeans, PA-C 9:01 AM [facility]

## 2016-05-31 ENCOUNTER — Other Ambulatory Visit: Payer: Self-pay | Admitting: Internal Medicine

## 2016-05-31 MED ORDER — LISINOPRIL-HYDROCHLOROTHIAZIDE 10-12.5 MG PO TABS: 1.0000 | ORAL_TABLET | Freq: Every day | ORAL | 1 refills | Status: DC

## 2016-07-22 ENCOUNTER — Telehealth: Payer: Self-pay | Admitting: *Deleted

## 2016-07-22 MED ORDER — AZITHROMYCIN 250 MG PO TABS: ORAL_TABLET | ORAL | 0 refills | Status: AC

## 2016-07-22 MED ORDER — PREDNISONE 20 MG PO TABS: ORAL_TABLET | ORAL | 0 refills | Status: DC

## 2016-07-22 NOTE — Telephone Encounter (Signed)
Patient called and complained of sinus drainage and pressure with dizziness. Per Dr Melford Aase, send in RX's for a Z-pak and Prednisone 20 mg.  The patient is aware and was also advised to get OTC generic Sudafed PE.

## 2016-07-28 ENCOUNTER — Ambulatory Visit (INDEPENDENT_AMBULATORY_CARE_PROVIDER_SITE_OTHER): Payer: BLUE CROSS/BLUE SHIELD | Admitting: Internal Medicine

## 2016-07-28 ENCOUNTER — Encounter: Payer: Self-pay | Admitting: Internal Medicine

## 2016-07-28 VITALS — BP 134/80 | HR 58 | Temp 98.4°F | Resp 18 | Ht 71.75 in | Wt 228.0 lb

## 2016-07-28 DIAGNOSIS — I10 Essential (primary) hypertension: Secondary | ICD-10-CM | POA: Diagnosis not present

## 2016-07-28 DIAGNOSIS — Z79899 Other long term (current) drug therapy: Secondary | ICD-10-CM | POA: Diagnosis not present

## 2016-07-28 DIAGNOSIS — M3219 Other organ or system involvement in systemic lupus erythematosus: Secondary | ICD-10-CM

## 2016-07-28 DIAGNOSIS — Z136 Encounter for screening for cardiovascular disorders: Secondary | ICD-10-CM

## 2016-07-28 DIAGNOSIS — E782 Mixed hyperlipidemia: Secondary | ICD-10-CM

## 2016-07-28 DIAGNOSIS — K219 Gastro-esophageal reflux disease without esophagitis: Secondary | ICD-10-CM

## 2016-07-28 DIAGNOSIS — K7581 Nonalcoholic steatohepatitis (NASH): Secondary | ICD-10-CM

## 2016-07-28 DIAGNOSIS — Z0000 Encounter for general adult medical examination without abnormal findings: Secondary | ICD-10-CM | POA: Diagnosis not present

## 2016-07-28 DIAGNOSIS — E559 Vitamin D deficiency, unspecified: Secondary | ICD-10-CM

## 2016-07-28 DIAGNOSIS — E349 Endocrine disorder, unspecified: Secondary | ICD-10-CM

## 2016-07-28 DIAGNOSIS — Z125 Encounter for screening for malignant neoplasm of prostate: Secondary | ICD-10-CM | POA: Diagnosis not present

## 2016-07-28 DIAGNOSIS — Z130 Encounter for screening for diseases of the blood and blood-forming organs and certain disorders involving the immune mechanism: Secondary | ICD-10-CM

## 2016-07-28 DIAGNOSIS — R7303 Prediabetes: Secondary | ICD-10-CM

## 2016-07-28 LAB — CBC WITH DIFFERENTIAL/PLATELET
Basophils Absolute: 0 cells/uL (ref 0–200)
Basophils Relative: 0 %
Eosinophils Absolute: 81 cells/uL (ref 15–500)
Eosinophils Relative: 1 %
HEMATOCRIT: 38.8 % (ref 38.5–50.0)
Hemoglobin: 13.2 g/dL (ref 13.2–17.1)
LYMPHS PCT: 20 %
Lymphs Abs: 1620 cells/uL (ref 850–3900)
MCH: 30.8 pg (ref 27.0–33.0)
MCHC: 34 g/dL (ref 32.0–36.0)
MCV: 90.7 fL (ref 80.0–100.0)
MPV: 10.2 fL (ref 7.5–12.5)
Monocytes Absolute: 810 cells/uL (ref 200–950)
Monocytes Relative: 10 %
NEUTROS PCT: 69 %
Neutro Abs: 5589 cells/uL (ref 1500–7800)
Platelets: 225 10*3/uL (ref 140–400)
RBC: 4.28 MIL/uL (ref 4.20–5.80)
RDW: 14 % (ref 11.0–15.0)
WBC: 8.1 10*3/uL (ref 3.8–10.8)

## 2016-07-28 LAB — TSH: TSH: 2.17 m[IU]/L (ref 0.40–4.50)

## 2016-07-28 LAB — VITAMIN B12: Vitamin B-12: 514 pg/mL (ref 200–1100)

## 2016-07-28 LAB — PSA: PSA: 0.4 ng/mL (ref <=4.0)

## 2016-07-28 MED ORDER — CYCLOBENZAPRINE HCL 10 MG PO TABS: 10.0000 mg | ORAL_TABLET | Freq: Every day | ORAL | 0 refills | Status: DC

## 2016-07-28 NOTE — Progress Notes (Signed)
Complete Physical  Assessment and Plan:   1. Essential hypertension -cont medications -well controlled -dash diet -monitor at home - Urinalysis, Routine w reflex microscopic - Microalbumin / creatinine urine ratio - EKG 12-Lead - TSH  2. Gastroesophageal reflux disease, esophagitis presence not specified -avoid NSAIDs -cont probiotics -cont medications -avoid trigger foods  3. NASH (nonalcoholic steatohepatitis) -monitor LFTs -diet and exercise  4. Hyperlipidemia -cont diet and exercise -at goal - Lipid panel  5. Prediabetes -back in normal range -historical diagnosis -cont diet and exercise - Hemoglobin A1c - Insulin, random  6. Vitamin D deficiency -cont VIt D - VITAMIN D 25 Hydroxy (Vit-D Deficiency, Fractures)  7. Medication management  - CBC with Differential/Platelet - BASIC METABOLIC PANEL WITH GFR - Hepatic function panel - Magnesium  8. Screening for prostate cancer  - PSA  9. Screening for deficiency anemia  - Iron and TIBC - Vitamin B12  10. Testosterone deficiency  - Testosterone  11.  Lupus -followed by [facility]  Discussed med's effects and SE's. Screening labs and tests as requested with regular follow-up as recommended.  HPI Patient presents for a complete physical.   His blood pressure has been controlled at home, today their BP is BP: 134/80 He does not workout. He denies chest pain, shortness of breath, dizziness.   He is on cholesterol medication and denies myalgias. His cholesterol is at goal. The cholesterol last visit was:   Lab Results  Component Value Date   CHOL 187 05/13/2016   HDL 28 (L) 05/13/2016   LDLCALC 142 (H) 05/13/2016   TRIG 86 05/13/2016   CHOLHDL 6.7 (H) 05/13/2016    Patient is on Vitamin D supplement.   Lab Results  Component Value Date   VD25OH 63 01/08/2016      Last PSA was: Lab Results  Component Value Date   PSA 0.53 07/24/2015  .  Denies BPH symptoms daytime  frequency, double voiding, dysuria, hematuria, hesitancy, incontinence, intermittency, nocturia, sensation of incomplete bladder emptying, suprapubic pain, urgency or weak urinary stream.  He reports that his lupus has been doing a lot better.  He had a bad breakout about 2 weeks ago.  He had to have a tooth pulled and it was exacerbated about amoxicillin.  He continues to [facility] with [facility] his rheumatology.  He is tolerating plaquinil well.  He has an eye appointment next week due to the medication.  He reports he is using lotion on his skin as needed.    He reports that he did recently take a zpak for a sinus infection and did have to take some prednisone.  Sinus infection is starting to clear now.  He reports that he also took some of the benadryl.    Current Medications:  Current Outpatient Prescriptions on File Prior to Visit  Medication Sig Dispense Refill  . ALPRAZolam (XANAX) 0.5 MG tablet Take 1 tablet (0.5 mg total) by mouth at bedtime as needed for anxiety. 90 tablet 0  . aspirin 81 MG tablet Take 81 mg by mouth daily.    Marland Kitchen atenolol (TENORMIN) 100 MG tablet TAKE 1 TABLET DAILY FOR BLOOD PRESSURE. 90 tablet 0  . Cholecalciferol (VITAMIN D3) 5000 UNITS CAPS Take 1 capsule by mouth daily.    . citalopram (CELEXA) 40 MG tablet Take 1 tablet (40 mg total) by mouth daily. 90 tablet 1  . Coenzyme Q10 (CO Q 10 PO) Take 1 tablet by mouth daily.    . hydroxychloroquine (PLAQUENIL)  200 MG tablet Take 200 mg by mouth 2 (two) times daily.    . hydrOXYzine (ATARAX/VISTARIL) 25 MG tablet Take 2 tablets (50 mg total) by mouth 3 (three) times daily as needed for itching. 60 tablet 0  . lisinopril-hydrochlorothiazide (PRINZIDE,ZESTORETIC) 10-12.5 MG tablet Take 1 tablet by mouth daily. 90 tablet 1  . MILK THISTLE PO Take 1 tablet by mouth daily.    . Multiple Vitamin (MULTIVITAMIN) capsule Take 1 capsule by mouth daily.    . Omega-3 Fatty Acids (FISH OIL PO) Take 1 capsule by mouth daily.     . predniSONE (DELTASONE) 20 MG tablet Take 1 tab tid 15 tablet 0  . Probiotic Product (PROBIOTIC DAILY) CAPS Take 1 capsule by mouth daily.    Marland Kitchen triamcinolone cream (KENALOG) 0.1 % Apply 1 application topically 3 (three) times daily. 30 g 0  . Turmeric 500 MG TABS Take 1 tablet by mouth daily.    . [DISCONTINUED] fenofibrate micronized (LOFIBRA) 134 MG capsule Take 134 mg by mouth daily.     No current facility-administered medications on file prior to visit.     Health Maintenance:  Immunization History  Administered Date(s) Administered  . Influenza Split 02/26/2013, 02/09/2015  . PPD Test 07/22/2013, 07/23/2014, 07/24/2015  . Pneumococcal Polysaccharide-23 05/22/2008  . Tdap 07/18/2012    Tetanus: 2014 Pneumovax:2010, 2017 Colonoscopy: 2013 Eye Exam: Getting yearlys due to plaquinil use.    Dentist: 2018   Patient Care Team: [name], MD as PCP - General (Internal Medicine) Marnee Spring, MD as Referring Physician (Internal Medicine)  Allergies:  Allergies  Allergen Reactions  . Plaquenil [Hydroxychloroquine] Other (See Comments)    Skin lupas   . Lipitor [Atorvastatin]     Myalgias   . Zocor [Simvastatin]     Myalgias    Medical History:  Past Medical History:  Diagnosis Date  . Arthritis    pt denies  . Asthma   . Cholelithiasis   . Discoid lupus   . Dysrhythmia    RBBB-incomplete  . Fatty liver   . GERD (gastroesophageal reflux disease)   . Hepatic steatosis   . Hypercholesteremia   . Hypertension   . Obesity   . Prediabetes   . Seasonal allergies   . Vitamin D deficiency     Surgical History:  Past Surgical History:  Procedure Laterality Date  . AXILLARY LYMPH NODE BIOPSY Right 08/27/2012   Procedure: AXILLARY LYMPH NODE BIOPSY ;  Surgeon: Rolm Bookbinder, MD;  Location: [facility];  Service: General;  Laterality: Right;  . CHOLECYSTECTOMY N/A 12/28/2012   Procedure: LAPAROSCOPIC CHOLECYSTECTOMY WITH INTRAOPERATIVE CHOLANGIOGRAM;   Surgeon: Joyice Faster. Cornett, MD;  Location: [facility];  Service: General;  Laterality: N/A;  . TONSILLECTOMY    . WISDOM TOOTH EXTRACTION      Family History:  Family History  Problem Relation Age of Onset  . Diabetes Mother   . Heart disease Mother   . Colon polyps Neg Hx   . Colon cancer Neg Hx     Social History:   Social History  Substance Use Topics  . Smoking status: Never Smoker  . Smokeless tobacco: Never Used  . Alcohol use Yes     Comment: occasional    Review of Systems:  Review of Systems  Constitutional: Positive for malaise/fatigue. Negative for chills and fever.  HENT: Negative for congestion, ear pain and sore throat.   Eyes: Negative.   Respiratory: Negative for cough, shortness of breath and wheezing.  Cardiovascular: Negative for chest pain, palpitations and leg swelling.  Gastrointestinal: Negative for abdominal pain, blood in stool, constipation, diarrhea, heartburn and melena.  Genitourinary: Negative.   Skin: Positive for rash.  Neurological: Negative for dizziness, sensory change, loss of consciousness and headaches.  Psychiatric/Behavioral: Negative for depression. The patient is not nervous/anxious and does not have insomnia.     Physical Exam: Estimated body mass index is 31.14 kg/m as calculated from the following:   Height as of this encounter: 5' 11.75" (1.822 m).   Weight as of this encounter: 228 lb (103.4 kg). BP 134/80   Pulse (!) 58   Temp 98.4 F (36.9 C) (Temporal)   Resp 18   Ht 5' 11.75" (1.822 m)   Wt 228 lb (103.4 kg)   BMI 31.14 kg/m   General Appearance: Well nourished, in no apparent distress.  WM Eyes: PERRLA, EOMs, conjunctiva no swelling or erythema ENT/Mouth: Ear canals clear bilaterally with no erythema, swelling, discharge.  TMs normal bilaterally with no erythema, bulging, or retractions.  Oropharynx clear and moist with no exudate, swelling, or erythema.  Dentition normal.   Neck: Supple, thyroid normal. No  bruits, JVD, cervical adenopathy Respiratory: Respiratory effort normal, BS equal bilaterally without rales, rhonchi, wheezing or stridor.  Cardio: RRR without murmurs, rubs or gallops. Brisk peripheral pulses without edema.  Chest: symmetric, with normal excursions Abdomen: Soft, nontender, no guarding, rebound, hernias, masses, or organomegaly. Musculoskeletal: Full ROM all peripheral extremities,5/5 strength, and normal gait.  Skin: Skin with flat ulcerative erythematous lesions to the bilateral hands with mild surrounding erythema without warmth.  Several discoid shaped 1 cm lesions to around the scalp.  Well healed scarring to the skin on the abdomen and around the forehead.  Warm, dry Neuro: A&Ox3, Cranial nerves intact, reflexes equal bilaterally. Normal muscle tone, no cerebellar symptoms. Sensation intact.  Psych: Normal affect, Insight and Judgment appropriate.   EKG: WNL no changes.  Over 40 minutes of exam, counseling, chart review and critical decision making was performed  Starlyn Skeans 9:34 AM [facility]

## 2016-07-29 LAB — HEPATIC FUNCTION PANEL
ALK PHOS: 43 U/L (ref 40–115)
ALT: 24 U/L (ref 9–46)
AST: 28 U/L (ref 10–35)
Albumin: 3.7 g/dL (ref 3.6–5.1)
Bilirubin, Direct: 0.1 mg/dL (ref <=0.2)
Indirect Bilirubin: 0.4 mg/dL (ref 0.2–1.2)
TOTAL PROTEIN: 7.1 g/dL (ref 6.1–8.1)
Total Bilirubin: 0.5 mg/dL (ref 0.2–1.2)

## 2016-07-29 LAB — MAGNESIUM: Magnesium: 1.7 mg/dL (ref 1.5–2.5)

## 2016-07-29 LAB — URINALYSIS, ROUTINE W REFLEX MICROSCOPIC
Bilirubin Urine: NEGATIVE
Glucose, UA: NEGATIVE
Hgb urine dipstick: NEGATIVE
Ketones, ur: NEGATIVE
LEUKOCYTES UA: NEGATIVE
Nitrite: NEGATIVE
PROTEIN: NEGATIVE
SPECIFIC GRAVITY, URINE: 1.017 (ref 1.001–1.035)
pH: 6 (ref 5.0–8.0)

## 2016-07-29 LAB — BASIC METABOLIC PANEL WITH GFR
BUN: 16 mg/dL (ref 7–25)
CO2: 26 mmol/L (ref 20–31)
Calcium: 9 mg/dL (ref 8.6–10.3)
Chloride: 101 mmol/L (ref 98–110)
Creat: 1.15 mg/dL (ref 0.70–1.33)
GFR, EST AFRICAN AMERICAN: 80 mL/min (ref >=60)
GFR, Est Non African American: 69 mL/min (ref >=60)
GLUCOSE: 84 mg/dL (ref 65–99)
POTASSIUM: 3.7 mmol/L (ref 3.5–5.3)
Sodium: 138 mmol/L (ref 135–146)

## 2016-07-29 LAB — IRON AND TIBC
%SAT: 54 % (ref 15–60)
Iron: 122 ug/dL (ref 50–180)
TIBC: 228 ug/dL — AB (ref 250–425)
UIBC: 106 ug/dL — ABNORMAL LOW (ref 125–400)

## 2016-07-29 LAB — INSULIN, RANDOM: INSULIN: 19.7 u[IU]/mL — AB (ref 2.0–19.6)

## 2016-07-29 LAB — VITAMIN D 25 HYDROXY (VIT D DEFICIENCY, FRACTURES): VIT D 25 HYDROXY: 66 ng/mL (ref 30–100)

## 2016-07-29 LAB — LIPID PANEL
Cholesterol: 166 mg/dL (ref <200)
HDL: 31 mg/dL — ABNORMAL LOW (ref >40)
LDL CALC: 108 mg/dL — AB (ref <100)
TRIGLYCERIDES: 136 mg/dL (ref <150)
Total CHOL/HDL Ratio: 5.4 Ratio — ABNORMAL HIGH (ref <5.0)
VLDL: 27 mg/dL (ref <30)

## 2016-07-29 LAB — HEMOGLOBIN A1C
HEMOGLOBIN A1C: 5.4 % (ref <5.7)
MEAN PLASMA GLUCOSE: 108 mg/dL

## 2016-07-29 LAB — MICROALBUMIN / CREATININE URINE RATIO
CREATININE, URINE: 163 mg/dL (ref 20–370)
MICROALB UR: 2.6 mg/dL
MICROALB/CREAT RATIO: 16 ug/mg{creat} (ref <30)

## 2016-07-29 LAB — TESTOSTERONE: TESTOSTERONE: 542 ng/dL (ref 250–827)

## 2016-08-01 DIAGNOSIS — M329 Systemic lupus erythematosus, unspecified: Secondary | ICD-10-CM | POA: Diagnosis not present

## 2016-08-01 DIAGNOSIS — L930 Discoid lupus erythematosus: Secondary | ICD-10-CM | POA: Diagnosis not present

## 2016-08-01 DIAGNOSIS — Z79899 Other long term (current) drug therapy: Secondary | ICD-10-CM | POA: Diagnosis not present

## 2016-08-15 ENCOUNTER — Other Ambulatory Visit: Payer: Self-pay | Admitting: Physician Assistant

## 2016-08-15 ENCOUNTER — Other Ambulatory Visit: Payer: Self-pay | Admitting: Internal Medicine

## 2016-08-15 DIAGNOSIS — L509 Urticaria, unspecified: Secondary | ICD-10-CM

## 2016-08-16 ENCOUNTER — Other Ambulatory Visit: Payer: Self-pay | Admitting: *Deleted

## 2016-08-16 MED ORDER — ALPRAZOLAM 1 MG PO TABS: ORAL_TABLET | ORAL | 0 refills | Status: DC

## 2016-08-17 ENCOUNTER — Other Ambulatory Visit: Payer: Self-pay | Admitting: *Deleted

## 2016-08-17 ENCOUNTER — Other Ambulatory Visit: Payer: Self-pay | Admitting: Internal Medicine

## 2016-08-17 MED ORDER — PREDNISONE 10 MG PO TABS: ORAL_TABLET | ORAL | 0 refills | Status: DC

## 2016-08-31 ENCOUNTER — Encounter: Payer: Self-pay | Admitting: Internal Medicine

## 2016-09-20 ENCOUNTER — Encounter: Payer: Self-pay | Admitting: Internal Medicine

## 2016-09-20 ENCOUNTER — Other Ambulatory Visit: Payer: Self-pay | Admitting: Internal Medicine

## 2016-09-20 DIAGNOSIS — K7581 Nonalcoholic steatohepatitis (NASH): Secondary | ICD-10-CM

## 2016-09-20 DIAGNOSIS — Z79899 Other long term (current) drug therapy: Secondary | ICD-10-CM

## 2016-09-20 DIAGNOSIS — M3219 Other organ or system involvement in systemic lupus erythematosus: Secondary | ICD-10-CM

## 2016-09-20 DIAGNOSIS — Z111 Encounter for screening for respiratory tuberculosis: Secondary | ICD-10-CM

## 2016-09-20 DIAGNOSIS — R945 Abnormal results of liver function studies: Secondary | ICD-10-CM

## 2016-09-20 DIAGNOSIS — R7989 Other specified abnormal findings of blood chemistry: Secondary | ICD-10-CM

## 2016-09-26 ENCOUNTER — Other Ambulatory Visit: Payer: BLUE CROSS/BLUE SHIELD

## 2016-09-26 DIAGNOSIS — R945 Abnormal results of liver function studies: Secondary | ICD-10-CM

## 2016-09-26 DIAGNOSIS — I10 Essential (primary) hypertension: Secondary | ICD-10-CM | POA: Diagnosis not present

## 2016-09-26 DIAGNOSIS — Z79899 Other long term (current) drug therapy: Secondary | ICD-10-CM

## 2016-09-26 DIAGNOSIS — K7581 Nonalcoholic steatohepatitis (NASH): Secondary | ICD-10-CM

## 2016-09-26 DIAGNOSIS — Z111 Encounter for screening for respiratory tuberculosis: Secondary | ICD-10-CM

## 2016-09-26 DIAGNOSIS — Z1159 Encounter for screening for other viral diseases: Secondary | ICD-10-CM | POA: Diagnosis not present

## 2016-09-26 DIAGNOSIS — R7989 Other specified abnormal findings of blood chemistry: Secondary | ICD-10-CM

## 2016-09-26 LAB — HEPATITIS B SURFACE ANTIBODY,QUALITATIVE: Hep B S Ab: NEGATIVE

## 2016-09-26 LAB — HEPATIC FUNCTION PANEL
ALK PHOS: 47 U/L (ref 40–115)
ALT: 27 U/L (ref 9–46)
AST: 36 U/L — ABNORMAL HIGH (ref 10–35)
Albumin: 3.8 g/dL (ref 3.6–5.1)
BILIRUBIN INDIRECT: 0.7 mg/dL (ref 0.2–1.2)
Bilirubin, Direct: 0.1 mg/dL (ref <=0.2)
TOTAL PROTEIN: 7.3 g/dL (ref 6.1–8.1)
Total Bilirubin: 0.8 mg/dL (ref 0.2–1.2)

## 2016-09-26 LAB — HEPATITIS B CORE ANTIBODY, TOTAL: Hep B Core Total Ab: NONREACTIVE

## 2016-09-26 LAB — HEPATITIS C ANTIBODY: HCV Ab: NEGATIVE

## 2016-09-26 LAB — HEPATITIS A ANTIBODY, TOTAL: Hep A Total Ab: NONREACTIVE

## 2016-09-28 LAB — QUANTIFERON TB GOLD ASSAY (BLOOD)
INTERFERON GAMMA RELEASE ASSAY: NEGATIVE
MITOGEN-NIL SO: 4.21 [IU]/mL
QUANTIFERON NIL VALUE: 0.03 [IU]/mL
Quantiferon Tb Ag Minus Nil Value: 0.01 IU/mL

## 2016-09-28 LAB — HEPATITIS B E ANTIBODY: Hepatitis Be Antibody: NONREACTIVE

## 2016-10-10 DIAGNOSIS — Z79899 Other long term (current) drug therapy: Secondary | ICD-10-CM | POA: Diagnosis not present

## 2016-10-26 DIAGNOSIS — Z79899 Other long term (current) drug therapy: Secondary | ICD-10-CM | POA: Diagnosis not present

## 2016-10-26 DIAGNOSIS — I10 Essential (primary) hypertension: Secondary | ICD-10-CM | POA: Diagnosis not present

## 2016-10-26 DIAGNOSIS — R590 Localized enlarged lymph nodes: Secondary | ICD-10-CM | POA: Diagnosis not present

## 2016-10-26 DIAGNOSIS — M329 Systemic lupus erythematosus, unspecified: Secondary | ICD-10-CM | POA: Diagnosis not present

## 2016-10-26 DIAGNOSIS — Z7952 Long term (current) use of systemic steroids: Secondary | ICD-10-CM | POA: Diagnosis not present

## 2016-10-26 DIAGNOSIS — L309 Dermatitis, unspecified: Secondary | ICD-10-CM | POA: Diagnosis not present

## 2016-10-26 DIAGNOSIS — D696 Thrombocytopenia, unspecified: Secondary | ICD-10-CM | POA: Diagnosis not present

## 2016-10-26 DIAGNOSIS — J45909 Unspecified asthma, uncomplicated: Secondary | ICD-10-CM | POA: Diagnosis not present

## 2016-10-26 DIAGNOSIS — R748 Abnormal levels of other serum enzymes: Secondary | ICD-10-CM | POA: Diagnosis not present

## 2016-10-26 DIAGNOSIS — M659 Synovitis and tenosynovitis, unspecified: Secondary | ICD-10-CM | POA: Diagnosis not present

## 2016-10-26 DIAGNOSIS — E7800 Pure hypercholesterolemia, unspecified: Secondary | ICD-10-CM | POA: Diagnosis not present

## 2016-10-26 DIAGNOSIS — E669 Obesity, unspecified: Secondary | ICD-10-CM | POA: Diagnosis not present

## 2016-10-26 DIAGNOSIS — Z7982 Long term (current) use of aspirin: Secondary | ICD-10-CM | POA: Diagnosis not present

## 2016-11-15 DIAGNOSIS — M329 Systemic lupus erythematosus, unspecified: Secondary | ICD-10-CM | POA: Diagnosis not present

## 2016-11-27 ENCOUNTER — Other Ambulatory Visit: Payer: Self-pay | Admitting: Internal Medicine

## 2016-11-28 ENCOUNTER — Other Ambulatory Visit: Payer: Self-pay | Admitting: Physician Assistant

## 2016-12-01 ENCOUNTER — Ambulatory Visit: Payer: Self-pay | Admitting: Internal Medicine

## 2016-12-13 ENCOUNTER — Encounter: Payer: Self-pay | Admitting: Physician Assistant

## 2016-12-13 ENCOUNTER — Ambulatory Visit (INDEPENDENT_AMBULATORY_CARE_PROVIDER_SITE_OTHER): Payer: BLUE CROSS/BLUE SHIELD | Admitting: Physician Assistant

## 2016-12-13 VITALS — BP 148/90 | HR 66 | Temp 97.7°F | Resp 16 | Ht 71.75 in | Wt 233.0 lb

## 2016-12-13 DIAGNOSIS — M1000 Idiopathic gout, unspecified site: Secondary | ICD-10-CM

## 2016-12-13 DIAGNOSIS — J3089 Other allergic rhinitis: Secondary | ICD-10-CM

## 2016-12-13 DIAGNOSIS — I10 Essential (primary) hypertension: Secondary | ICD-10-CM

## 2016-12-13 MED ORDER — LISINOPRIL-HYDROCHLOROTHIAZIDE 20-25 MG PO TABS: 1.0000 | ORAL_TABLET | Freq: Every day | ORAL | 0 refills | Status: DC

## 2016-12-13 NOTE — Patient Instructions (Signed)
Your ears and sinuses are connected by the eustachian tube. When your sinuses are inflamed, this can close off the tube and cause fluid to collect in your middle ear. This can then cause dizziness, popping, clicking, ringing, and echoing in your ears. This is often NOT an infection and does NOT require antibiotics, it is caused by inflammation so the treatments help the inflammation. This can take a long time to get better so please be patient.  Here are things you can do to help with this: - Try the Flonase or Nasonex. Remember to spray each nostril twice towards the outer part of your eye.  Do not sniff but instead pinch your nose and tilt your head back to help the medicine get into your sinuses.  The best time to do this is at bedtime.Stop if you get blurred vision or nose bleeds.  -While drinking fluids, pinch and hold nose close and swallow, to help open eustachian tubes to drain fluid behind ear drums. -Please pick one of the over the counter allergy medications below and take it once daily for allergies.  It will also help with fluid behind ear drums. Claritin or loratadine cheapest but likely the weakest  Zyrtec or certizine at night because it can make you sleepy The strongest is allegra or fexafinadine  Cheapest at [employer], [employer], [employer] -can use decongestant over the counter, please do not use if you have high blood pressure or certain heart conditions.   if worsening HA, changes vision/speech, imbalance, weakness go to the ER   Monitor your blood pressure at home. Go to the ER if any CP, SOB, nausea, dizziness, severe HA, changes vision/speech  Goal BP:  For patients younger than 60: Goal BP < 140/90. For patients 60 and older: Goal BP < 150/90. For patients with diabetes: Goal BP < 140/90. Your most recent BP: BP: (!) 148/90 (right arm)   Take your medications faithfully as instructed. Maintain a healthy weight. Get at least 150 minutes of aerobic exercise per week. Minimize  salt intake. Minimize alcohol intake  DASH Eating Plan DASH stands for "Dietary Approaches to Stop Hypertension." The DASH eating plan is a healthy eating plan that has been shown to reduce high blood pressure (hypertension). Additional health benefits may include reducing the risk of type 2 diabetes mellitus, heart disease, and stroke. The DASH eating plan may also help with weight loss. WHAT DO I NEED TO KNOW ABOUT THE DASH EATING PLAN? For the DASH eating plan, you will follow these general guidelines:  Choose foods with a percent daily value for sodium of less than 5% (as listed on the food label).  Use salt-free seasonings or herbs instead of table salt or sea salt.  Check with your health care provider or pharmacist before using salt substitutes.  Eat lower-sodium products, often labeled as "lower sodium" or "no salt added."  Eat fresh foods.  Eat more vegetables, fruits, and low-fat dairy products.  Choose whole grains. Look for the word "whole" as the first word in the ingredient list.  Choose fish and skinless chicken or [place] more often than red meat. Limit fish, poultry, and meat to 6 oz (170 g) each day.  Limit sweets, desserts, sugars, and sugary drinks.  Choose heart-healthy fats.  Limit cheese to 1 oz (28 g) per day.  Eat more home-cooked food and less restaurant, buffet, and fast food.  Limit fried foods.  Cook foods using methods other than frying.  Limit canned vegetables. If you do  use them, rinse them well to decrease the sodium.  When eating at a restaurant, ask that your food be prepared with less salt, or no salt if possible. WHAT FOODS CAN I EAT? Seek help from a dietitian for individual calorie needs. Grains Whole grain or whole wheat bread. Brown rice. Whole grain or whole wheat pasta. Quinoa, bulgur, and whole grain cereals. Low-sodium cereals. Corn or whole wheat flour tortillas. Whole grain cornbread. Whole grain crackers. Low-sodium  crackers. Vegetables Fresh or frozen vegetables (raw, steamed, roasted, or grilled). Low-sodium or reduced-sodium tomato and vegetable juices. Low-sodium or reduced-sodium tomato sauce and paste. Low-sodium or reduced-sodium canned vegetables.  Fruits All fresh, canned (in natural juice), or frozen fruits. Meat and Other Protein Products Ground beef (85% or leaner), grass-fed beef, or beef trimmed of fat. Skinless chicken or [place]. Ground chicken or [place]. Pork trimmed of fat. All fish and seafood. Eggs. Dried beans, peas, or lentils. Unsalted nuts and seeds. Unsalted canned beans. Dairy Low-fat dairy products, such as skim or 1% milk, 2% or reduced-fat cheeses, low-fat ricotta or cottage cheese, or plain low-fat yogurt. Low-sodium or reduced-sodium cheeses. Fats and Oils Tub margarines without trans fats. Light or reduced-fat mayonnaise and salad dressings (reduced sodium). Avocado. Safflower, olive, or canola oils. Natural peanut or almond butter. Other Unsalted popcorn and pretzels. The items listed above may not be a complete list of recommended foods or beverages. Contact your dietitian for more options. WHAT FOODS ARE NOT RECOMMENDED? Grains White bread. White pasta. White rice. Refined cornbread. Bagels and croissants. Crackers that contain trans fat. Vegetables Creamed or fried vegetables. Vegetables in a cheese sauce. Regular canned vegetables. Regular canned tomato sauce and paste. Regular tomato and vegetable juices. Fruits Dried fruits. Canned fruit in light or heavy syrup. Fruit juice. Meat and Other Protein Products Fatty cuts of meat. Ribs, chicken wings, bacon, sausage, bologna, salami, chitterlings, fatback, hot dogs, bratwurst, and packaged luncheon meats. Salted nuts and seeds. Canned beans with salt. Dairy Whole or 2% milk, cream, half-and-half, and cream cheese. Whole-fat or sweetened yogurt. Full-fat cheeses or blue cheese. Nondairy creamers and whipped toppings.  Processed cheese, cheese spreads, or cheese curds. Condiments Onion and garlic salt, seasoned salt, table salt, and sea salt. Canned and packaged gravies. Worcestershire sauce. Tartar sauce. Barbecue sauce. Teriyaki sauce. Soy sauce, including reduced sodium. Steak sauce. Fish sauce. Oyster sauce. Cocktail sauce. Horseradish. Ketchup and mustard. Meat flavorings and tenderizers. Bouillon cubes. Hot sauce. Tabasco sauce. Marinades. Taco seasonings. Relishes. Fats and Oils Butter, stick margarine, lard, shortening, ghee, and bacon fat. Coconut, palm kernel, or palm oils. Regular salad dressings. Other Pickles and olives. Salted popcorn and pretzels. The items listed above may not be a complete list of foods and beverages to avoid. Contact your dietitian for more information. WHERE CAN I FIND MORE INFORMATION? National Heart, Lung, and Blood Institute: travelstabloid.com Document Released: 04/14/2011 Document Revised: 09/09/2013 Document Reviewed: 02/27/2013 [facility] Patient Information 2015 [place], [place]. This information is not intended to replace advice given to you by your health care provider. Make sure you discuss any questions you have with your health care provider.

## 2016-12-13 NOTE — Progress Notes (Signed)
Subjective:    Patient ID: Sean Maldonado, male    DOB: 1955-06-01, 61 y.o.   MRN: [id number]  HPI 61 y.o. WM with SLE presents with elevated BP. He was taken off plaquenil due to vision issues and started on prednisone 3m daily. He states he was involved in a study and his BP was elevated before the study, at 180/90 today it is elevated. He is on atenolol 587mand lisinopril 10/12.9m19m  BP Readings from Last 5 Encounters:  12/13/16 (!) 148/90  07/28/16 134/80  05/13/16 100/60  01/08/16 122/82  11/17/15 (!) 97/58   Blood pressure (!) 148/90, pulse 66, temperature 97.7 F (36.5 C), resp. rate 16, height 5' 11.75" (1.822 m), weight 233 lb (105.7 kg), SpO2 99 %.  Lab Results  Component Value Date   CREATININE 1.15 07/28/2016   BUN 16 07/28/2016   NA 138 07/28/2016   K 3.7 07/28/2016   CL 101 07/28/2016   CO2 26 07/28/2016    Medications Current Outpatient Prescriptions on File Prior to Visit  Medication Sig  . ALPRAZolam (XANAX) 1 MG tablet Take 1/2 to 1 tablet tid PRN for anxiety.  . aMarland Kitchenpirin EC 81 MG tablet Take 81 mg by mouth.  . aMarland Kitchenenolol (TENORMIN) 100 MG tablet TAKE 1 TABLET DAILY FOR BLOOD PRESSURE.  . CMarland Kitchenolecalciferol (VITAMIN D3) 5000 UNITS CAPS Take 1 capsule by mouth daily.  . citalopram (CELEXA) 40 MG tablet Take 1 tablet (40 mg total) by mouth daily.  . Coenzyme Q10 (CO Q 10 PO) Take 1 tablet by mouth daily.  . hydrOXYzine (ATARAX/VISTARIL) 25 MG tablet TAKE 2 TABLETS THREE TIMES DAILY AS NEEDED FOR ITCHING.  . lMarland Kitchensinopril-hydrochlorothiazide (PRINZIDE,ZESTORETIC) 10-12.5 MG tablet TAKE 1 TABLET ONCE DAILY.  . MMarland KitchenLK THISTLE PO Take 1 tablet by mouth daily.  . Multiple Vitamin (MULTIVITAMIN) capsule Take 1 capsule by mouth daily.  . Omega-3 Fatty Acids (FISH OIL PO) Take 1 capsule by mouth daily.  . predniSONE (DELTASONE) 10 MG tablet Take 1 tablet twice daily  . Probiotic Product (PROBIOTIC DAILY) CAPS Take 1 capsule by mouth daily.  . tMarland Kitcheniamcinolone cream (KENALOG)  0.1 % Apply 1 application topically 3 (three) times daily.  . Turmeric 500 MG TABS Take 1 tablet by mouth daily.  . [DISCONTINUED] fenofibrate micronized (LOFIBRA) 134 MG capsule Take 134 mg by mouth daily.   No current facility-administered medications on file prior to visit.     Problem list He has GERD (gastroesophageal reflux disease); NASH (nonalcoholic steatohepatitis); Gastritis, acute with hemorrhage; Asthma; Seasonal allergies; Hyperlipidemia; Hypertension; Prediabetes; Vitamin D deficiency; Medication management; Morbid obesity (BMI 33.37); Gout; Near syncope; Lupus (systemic lupus erythematosus) (HCCGridleyAbnormal LFTs; and Screening-pulmonary TB on his problem list.  Review of Systems  Constitutional: Negative.   HENT: Negative.   Eyes: Negative.  Negative for photophobia, pain, discharge, redness and itching.  Respiratory: Negative.   Cardiovascular: Negative.   Gastrointestinal: Negative.   Endocrine: Negative.   Musculoskeletal: Negative.   Skin: Positive for rash (better).  Neurological: Positive for headaches. Negative for dizziness, tremors, seizures, syncope, facial asymmetry, speech difficulty, weakness, light-headedness and numbness.  Hematological: Negative.   Psychiatric/Behavioral: Negative.        Objective:   Physical Exam  Constitutional: He is oriented to person, place, and time. He appears well-developed and well-nourished. No distress.  HENT:  Head: Normocephalic.  Right Ear: External ear normal.  Left Ear: External ear normal.  Nose: Nose normal.  Mouth/Throat: Oropharynx is clear and moist. No  oropharyngeal exudate (no ulcers).  Eyes: Pupils are equal, round, and reactive to light. Conjunctivae are normal.  Neck: Normal range of motion. Neck supple.  Cardiovascular: Normal rate and regular rhythm.   Pulmonary/Chest: Effort normal and breath sounds normal.  Abdominal: Soft. Bowel sounds are normal. There is no tenderness.  Musculoskeletal: Normal  range of motion. He exhibits no tenderness.  Lymphadenopathy:    He has no cervical adenopathy.  Neurological: He is alert and oriented to person, place, and time. No cranial nerve deficit.  Skin: No rash noted. No erythema.       Assessment & Plan:    Essential hypertension Increase lisinopril 20/25, sent in  Need recheck CMP and HTN at next OV in 2 weeks - cut back on prednisone to 5 mg  Allergic rhinitis - Allegra OTC, increase H20, allergy hygiene explained. No need for ABX  Acute idiopathic gout, unspecified site Get back on allopurinol 1/2 pill Has colchicine at the house to take as needed   if worsening HA, changes vision/speech, imbalance, weakness go to the ER   Future Appointments Date Time Provider [place]  12/29/2016 2:45 PM [name], MD [id number] None

## 2016-12-17 ENCOUNTER — Emergency Department (HOSPITAL_COMMUNITY)
Admission: EM | Admit: 2016-12-17 | Discharge: 2016-12-17 | Disposition: A | Discharge status: Home / Self Care | Payer: BLUE CROSS/BLUE SHIELD | Attending: Emergency Medicine | Admitting: Emergency Medicine

## 2016-12-17 ENCOUNTER — Encounter (HOSPITAL_COMMUNITY): Payer: Self-pay | Admitting: *Deleted

## 2016-12-17 DIAGNOSIS — J45909 Unspecified asthma, uncomplicated: Secondary | ICD-10-CM | POA: Diagnosis not present

## 2016-12-17 DIAGNOSIS — I10 Essential (primary) hypertension: Secondary | ICD-10-CM | POA: Diagnosis not present

## 2016-12-17 DIAGNOSIS — E785 Hyperlipidemia, unspecified: Secondary | ICD-10-CM | POA: Diagnosis not present

## 2016-12-17 DIAGNOSIS — M3210 Systemic lupus erythematosus, organ or system involvement unspecified: Secondary | ICD-10-CM | POA: Diagnosis not present

## 2016-12-17 DIAGNOSIS — Z79899 Other long term (current) drug therapy: Secondary | ICD-10-CM | POA: Diagnosis not present

## 2016-12-17 DIAGNOSIS — R55 Syncope and collapse: Secondary | ICD-10-CM | POA: Diagnosis not present

## 2016-12-17 DIAGNOSIS — E860 Dehydration: Secondary | ICD-10-CM | POA: Insufficient documentation

## 2016-12-17 DIAGNOSIS — Z7982 Long term (current) use of aspirin: Secondary | ICD-10-CM | POA: Diagnosis not present

## 2016-12-17 DIAGNOSIS — R404 Transient alteration of awareness: Secondary | ICD-10-CM | POA: Diagnosis not present

## 2016-12-17 HISTORY — DX: Reserved for concepts with insufficient information to code with codable children: IMO0002

## 2016-12-17 HISTORY — DX: Systemic lupus erythematosus, unspecified: M32.9

## 2016-12-17 LAB — URINALYSIS, ROUTINE W REFLEX MICROSCOPIC
BACTERIA UA: NONE SEEN
BILIRUBIN URINE: NEGATIVE
GLUCOSE, UA: NEGATIVE mg/dL
HGB URINE DIPSTICK: NEGATIVE
Ketones, ur: NEGATIVE mg/dL
LEUKOCYTES UA: NEGATIVE
NITRITE: NEGATIVE
PH: 6 (ref 5.0–8.0)
Protein, ur: 100 mg/dL — AB
RBC / HPF: NONE SEEN RBC/hpf (ref 0–5)
SPECIFIC GRAVITY, URINE: 1.012 (ref 1.005–1.030)
Squamous Epithelial / LPF: NONE SEEN
WBC, UA: NONE SEEN WBC/hpf (ref 0–5)

## 2016-12-17 LAB — BASIC METABOLIC PANEL
ANION GAP: 10 (ref 5–15)
BUN: 16 mg/dL (ref 6–20)
CALCIUM: 8.7 mg/dL — AB (ref 8.9–10.3)
CO2: 25 mmol/L (ref 22–32)
Chloride: 104 mmol/L (ref 101–111)
Creatinine, Ser: 1.24 mg/dL (ref 0.61–1.24)
GFR calc Af Amer: >60 mL/min (ref >60)
GLUCOSE: 123 mg/dL — AB (ref 65–99)
POTASSIUM: 3.6 mmol/L (ref 3.5–5.1)
SODIUM: 139 mmol/L (ref 135–145)

## 2016-12-17 LAB — CBC
HEMATOCRIT: 37.1 % — AB (ref 39.0–52.0)
Hemoglobin: 12.5 g/dL — ABNORMAL LOW (ref 13.0–17.0)
MCH: 31.2 pg (ref 26.0–34.0)
MCHC: 33.7 g/dL (ref 30.0–36.0)
MCV: 92.5 fL (ref 78.0–100.0)
Platelets: 203 10*3/uL (ref 150–400)
RBC: 4.01 MIL/uL — ABNORMAL LOW (ref 4.22–5.81)
RDW: 13.4 % (ref 11.5–15.5)
WBC: 5.8 10*3/uL (ref 4.0–10.5)

## 2016-12-17 LAB — I-STAT TROPONIN, ED: Troponin i, poc: 0.01 ng/mL (ref 0.00–0.08)

## 2016-12-17 MED ORDER — SODIUM CHLORIDE 0.9 % IV BOLUS (SEPSIS)
1000.0000 mL | Freq: Once | INTRAVENOUS | Status: AC
  Administered 2016-12-17: 1000 mL via INTRAVENOUS

## 2016-12-17 NOTE — ED Triage Notes (Signed)
Syncopal episode x 2  Today while working the [employer] . Denies pain

## 2016-12-17 NOTE — ED Provider Notes (Signed)
[facility] DEPT Provider Note   CSN: [id number] Arrival date & time: 12/17/16  1020     History   Chief Complaint Chief Complaint  Patient presents with  . Loss of Consciousness    HPI Sean Maldonado is a 61 y.o. male.  61 yo M with a cc of syncope.  Occurred x2.  Has been working on golf [place] for past couple of days.  Decreased oral intake.  Denies chest pain, back pain, sob. Was standing behind his truck talking with friends, felt suddenly sweaty, weak, and collapsed to the ground. The patient had another episode very shortly thereafter. Currently he feels back to his baseline. Denies chest pain or shortness of breath. Denies headache. He thinks maybe overdid it because he had a few drinks last night.   The history is provided by the patient.  Loss of Consciousness   This is a new problem. The current episode started 1 to 2 hours ago. The problem occurs constantly. The problem has not changed since onset.He lost consciousness for a period of less than one minute. The problem is associated with normal activity. Pertinent negatives include abdominal pain, chest pain, confusion, congestion, fever, headaches, palpitations and vomiting. He has tried nothing for the symptoms. The treatment provided no relief.    Past Medical History:  Diagnosis Date  . Asthma   . GERD (gastroesophageal reflux disease)   . Hypertension   . Lupus   . Prediabetes   . Seasonal allergies   . Vitamin D deficiency     Patient Active Problem List   Diagnosis Date Noted  . Abnormal LFTs 09/20/2016  . Screening-pulmonary TB 09/20/2016  . Near syncope 11/16/2015  . Lupus (systemic lupus erythematosus) ([place]) 11/16/2015  . Gout 02/09/2015  . Medication management 07/23/2014  . Morbid obesity (BMI 33.37) 07/23/2014  . Asthma   . Seasonal allergies   . Hyperlipidemia   . Hypertension   . Prediabetes   . Vitamin D deficiency   . Gastritis, acute with hemorrhage 08/01/2011  . GERD  (gastroesophageal reflux disease) 07/28/2011  . NASH (nonalcoholic steatohepatitis) 07/28/2011    Past Surgical History:  Procedure Laterality Date  . AXILLARY LYMPH NODE BIOPSY Right 08/27/2012   Procedure: AXILLARY LYMPH NODE BIOPSY ;  Surgeon: Rolm Bookbinder, MD;  Location: [facility];  Service: General;  Laterality: Right;  . CHOLECYSTECTOMY N/A 12/28/2012   Procedure: LAPAROSCOPIC CHOLECYSTECTOMY WITH INTRAOPERATIVE CHOLANGIOGRAM;  Surgeon: Joyice Faster. Cornett, MD;  Location: [facility];  Service: General;  Laterality: N/A;  . TONSILLECTOMY    . WISDOM TOOTH EXTRACTION         Home Medications    Prior to Admission medications   Medication Sig Start Date End Date Taking? Authorizing Provider  ALPRAZolam Duanne Moron) 1 MG tablet Take 1/2 to 1 tablet tid PRN for anxiety. 08/16/16   [name], MD  aspirin EC 81 MG tablet Take 81 mg by mouth.    [provider]  atenolol (TENORMIN) 100 MG tablet TAKE 1 TABLET DAILY FOR BLOOD PRESSURE. 11/27/16   [name], MD  Cholecalciferol (VITAMIN D3) 5000 UNITS CAPS Take 1 capsule by mouth daily.    [provider]  citalopram (CELEXA) 40 MG tablet Take 1 tablet (40 mg total) by mouth daily. 12/10/15   [name], MD  Coenzyme Q10 (CO Q 10 PO) Take 1 tablet by mouth daily.    [provider]  hydrOXYzine (ATARAX/VISTARIL) 25 MG tablet TAKE 2 TABLETS THREE TIMES DAILY AS NEEDED FOR  ITCHING. 08/15/16   Vicie Mutters, PA-C  lisinopril-hydrochlorothiazide (PRINZIDE,ZESTORETIC) 20-25 MG tablet Take 1 tablet by mouth daily. 12/13/16   Vicie Mutters, PA-C  MILK THISTLE PO Take 1 tablet by mouth daily.    [provider]  Multiple Vitamin (MULTIVITAMIN) capsule Take 1 capsule by mouth daily.    [provider]  Omega-3 Fatty Acids (FISH OIL PO) Take 1 capsule by mouth daily.    [provider]  predniSONE (DELTASONE) 10 MG tablet Take 1 tablet twice daily 08/17/16   [name], MD  Probiotic  Product (PROBIOTIC DAILY) CAPS Take 1 capsule by mouth daily.    [provider]  triamcinolone cream (KENALOG) 0.1 % Apply 1 application topically 3 (three) times daily. 10/01/15   Forcucci, Courtney, PA-C  Turmeric 500 MG TABS Take 1 tablet by mouth daily.    [provider]    Family History Family History  Problem Relation Age of Onset  . Diabetes Mother   . Heart disease Mother   . Colon polyps Neg Hx   . Colon cancer Neg Hx     Social History Social History  Substance Use Topics  . Smoking status: Never Smoker  . Smokeless tobacco: Never Used  . Alcohol use Yes     Comment: occasional     Allergies   Lipitor [atorvastatin]; Penicillins; and Zocor [simvastatin]   Review of Systems Review of Systems  Constitutional: Negative for chills and fever.  HENT: Negative for congestion and facial swelling.   Eyes: Negative for discharge and visual disturbance.  Respiratory: Negative for shortness of breath.   Cardiovascular: Positive for syncope. Negative for chest pain and palpitations.  Gastrointestinal: Negative for abdominal pain, diarrhea and vomiting.  Musculoskeletal: Negative for arthralgias and myalgias.  Skin: Negative for color change and rash.  Neurological: Negative for tremors, syncope and headaches.  Psychiatric/Behavioral: Negative for confusion and dysphoric mood.     Physical Exam Updated Vital Signs BP (!) 157/94   Pulse 75   Temp 98.2 F (36.8 C) (Oral)   Resp (!) 21   Ht 5\' 11"  (1.803 m)   Wt 105.7 kg (233 lb)   SpO2 99%   BMI 32.50 kg/m   Physical Exam  Constitutional: He is oriented to person, place, and time. He appears well-developed and well-nourished.  HENT:  Head: Normocephalic and atraumatic.  Eyes: Pupils are equal, round, and reactive to light. EOM are normal.  Neck: Normal range of motion. Neck supple. No JVD present.  Cardiovascular: Normal rate and regular rhythm.  Exam reveals no gallop and no friction rub.     No murmur heard. Pulmonary/Chest: No respiratory distress. He has no wheezes.  Abdominal: He exhibits no distension and no mass. There is no tenderness. There is no rebound and no guarding.  Musculoskeletal: Normal range of motion.  Neurological: He is alert and oriented to person, place, and time.  Skin: No rash noted. No pallor.  Psychiatric: He has a normal mood and affect. His behavior is normal.  Nursing note and vitals reviewed.    ED Treatments / Results  Labs (all labs ordered are listed, but only abnormal results are displayed) Labs Reviewed  BASIC METABOLIC PANEL - Abnormal; Notable for the following:       Result Value   Glucose, Bld 123 (*)    Calcium 8.7 (*)    All other components within normal limits  CBC - Abnormal; Notable for the following:    RBC 4.01 (*)  Hemoglobin 12.5 (*)    HCT 37.1 (*)    All other components within normal limits  URINALYSIS, ROUTINE W REFLEX MICROSCOPIC - Abnormal; Notable for the following:    APPearance HAZY (*)    Protein, ur 100 (*)    All other components within normal limits  I-STAT TROPONIN, ED  CBG MONITORING, ED    EKG  EKG Interpretation  Date/Time:  Saturday December 17 2016 10:21:28 EDT Ventricular Rate:  80 PR Interval:    QRS Duration: 150 QT Interval:  424 QTC Calculation: 490 R Axis:   -47 Text Interpretation:  Sinus rhythm RBBB and LAFB No significant change since last tracing Confirmed by Deno Etienne [phone]) on 12/17/2016 10:40:32 AM       Radiology No results found.  Procedures Procedures (including critical care time)  Medications Ordered in ED Medications  sodium chloride 0.9 % bolus 1,000 mL (0 mLs Intravenous Stopped 12/17/16 1305)     Initial Impression / Assessment and Plan / ED Course  I have reviewed the triage vital signs and the nursing notes.  Pertinent labs & imaging results that were available during my care of the patient were reviewed by me and considered in my medical decision  making (see chart for details).     61 yo M with a cc of a syncopal event. This is most likely due to dehydration based on the history. There is a vasovagal portion to it as well. The patient also was recently changed his blood pressure medication over the past four days.  Patient continues to be asymptomatic. Given fluids with some mild improvement. Discharge home with PCP follow-up.  2:51 PM:  I have discussed the diagnosis/risks/treatment options with the patient and family and believe the pt to be eligible for discharge home to follow-up with PCP. We also discussed returning to the ED immediately if new or worsening sx occur. We discussed the sx which are most concerning (e.g., cp, sob, headache) that necessitate immediate return. Medications administered to the patient during their visit and any new prescriptions provided to the patient are listed below.  Medications given during this visit Medications  sodium chloride 0.9 % bolus 1,000 mL (0 mLs Intravenous Stopped 12/17/16 1305)     The patient appears reasonably screen and/or stabilized for discharge and I doubt any other medical condition or other [facility] requiring further screening, evaluation, or treatment in the ED at this time prior to discharge.    Final Clinical Impressions(s) / ED Diagnoses   Final diagnoses:  Syncope and collapse  Dehydration    New Prescriptions Discharge Medication List as of 12/17/2016  1:21 PM       Deno Etienne, DO 12/17/16 1452

## 2016-12-17 NOTE — ED Notes (Signed)
Got patient undress on the monitor did ekg shown to Dr floyd patient is resting with call bell in reach 

## 2016-12-17 NOTE — ED Notes (Signed)
Patient states he was working at [employer] and had syncopal episode x 2 , states he had a little to drink last pm and being outside thinks he was a little dehydrated. 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 And he feel the combination of all could be the cause. Denies pain. Currently alert oriented. Patient was given 500 cc bolus per ems.

## 2016-12-29 ENCOUNTER — Ambulatory Visit (INDEPENDENT_AMBULATORY_CARE_PROVIDER_SITE_OTHER): Payer: Self-pay | Admitting: Internal Medicine

## 2016-12-29 ENCOUNTER — Encounter: Payer: Self-pay | Admitting: Internal Medicine

## 2016-12-29 VITALS — BP 130/84 | HR 80 | Temp 97.5°F | Resp 18 | Ht 71.75 in | Wt 230.8 lb

## 2016-12-29 DIAGNOSIS — E782 Mixed hyperlipidemia: Secondary | ICD-10-CM

## 2016-12-29 DIAGNOSIS — R7303 Prediabetes: Secondary | ICD-10-CM

## 2016-12-29 DIAGNOSIS — M1000 Idiopathic gout, unspecified site: Secondary | ICD-10-CM

## 2016-12-29 DIAGNOSIS — E559 Vitamin D deficiency, unspecified: Secondary | ICD-10-CM

## 2016-12-29 DIAGNOSIS — M3219 Other organ or system involvement in systemic lupus erythematosus: Secondary | ICD-10-CM

## 2016-12-29 DIAGNOSIS — I10 Essential (primary) hypertension: Secondary | ICD-10-CM

## 2016-12-29 DIAGNOSIS — K219 Gastro-esophageal reflux disease without esophagitis: Secondary | ICD-10-CM

## 2016-12-29 DIAGNOSIS — Z79899 Other long term (current) drug therapy: Secondary | ICD-10-CM

## 2016-12-29 NOTE — Progress Notes (Signed)
This very nice 61 y.o. single WM presents for 3 month follow up with Hypertension, Hyperlipidemia, Pre-Diabetes and Vitamin D Deficiency. 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 Then on Aug 11 he got over-heated at an out door golf tournament and fainted and was taken to the ER and had a negative w/u. Today he declines having any labs done.       Patient is treated for HTN (2004 and Tx started in 2008)  & BP has been controlled at home. Today's BP is at goal - 130/84. Patient has had no complaints of any cardiac type chest pain, palpitations, dyspnea/orthopnea/PND, dizziness, claudication, or dependent edema.     Hyperlipidemia is controlled with diet & meds. Patient denies myalgias or other med SE's. Last Lipids were not at goal: Lab Results  Component Value Date   CHOL 166 07/28/2016   HDL 31 (L) 07/28/2016   LDLCALC 108 (H) 07/28/2016   TRIG 136 07/28/2016   CHOLHDL 5.4 (H) 07/28/2016      Also, the patient has history of Morbid Obesity (BMI 30+) and  PreDiabetes (A1c 5.9% in 2011 and 6.0% in 2012 and Oct 2016) )  and has had no symptoms of reactive hypoglycemia, diabetic polys, paresthesias or visual blurring.  Last A1c was at goal: Lab Results  Component Value Date   HGBA1C 5.4 07/28/2016      Further, the patient also has history of Vitamin D Deficiency ("42" on tx in 2008) and supplements vitamin D without any suspected side-effects. Last vitamin D was at goal:  Lab Results  Component Value Date   VD25OH 66 07/28/2016   Current Outpatient Prescriptions on File Prior to Visit  Medication Sig  . ALPRAZolam1 MG tablet Take 1/2 to 1 tablet tid PRN for anxiety.  Marland Kitchen aspirin EC 81 MG tablet Take 81 mg by mouth.  Marland Kitchen atenolol 100 MG TAKE 1 TABLET DAILY  . VITAMIN D 5000 UNITS  Take 1 capsule by mouth daily.  . citalopram  40 MG  Take 1 tablet (40 mg total) by mouth daily.  . hydrOXYzine  25 MG tablet TAKE 2 TAB  3 x / DAILY AS NEEDED   . lisinopril-hctz 20-25 MG tablet Take 1 tablet by mouth daily.  Marland Kitchen MILK THISTLE PO Take 1 tablet by mouth daily.  . Multiple Vitamin  Take 1 capsule by mouth daily.  . Omega-3 FISH OIL Take 1 capsule by mouth daily.  . Probiotic CAPS Take 1 capsule by mouth daily.  Marland Kitchen triamcinolone cream  0.1 % Apply 1 application topically 3 (three) times daily.  . Turmeric 500 MG TABS Take 1 tablet by mouth daily.   Allergies  Allergen Reactions  . Lipitor [Atorvastatin]     Myalgias   . Penicillins     Causes lupus flare ups  . Zocor [Simvastatin]     Myalgias   PMHx:   Past Medical History:  Diagnosis Date  . Asthma   . GERD (gastroesophageal reflux disease)   . Hypertension   . Lupus   . Prediabetes   . Seasonal allergies   . Vitamin D deficiency    Immunization History  Administered Date(s) Administered  . Influenza Split 02/26/2013, 02/09/2015  . PPD Test 07/22/2013, 07/23/2014, 07/24/2015  . Pneumococcal Polysaccharide-23 05/22/2008  . Tdap 07/18/2012   Past Surgical History:  Procedure Laterality Date  . AXILLARY LYMPH NODE BIOPSY Right 08/27/2012  Procedure: AXILLARY LYMPH NODE BIOPSY ;  Surgeon: Rolm Bookbinder, MD;  Location: [facility];  Service: General;  Laterality: Right;  . CHOLECYSTECTOMY N/A 12/28/2012   Procedure: LAPAROSCOPIC CHOLECYSTECTOMY WITH INTRAOPERATIVE CHOLANGIOGRAM;  Surgeon: Joyice Faster. Cornett, MD;  Location: [facility];  Service: General;  Laterality: N/A;  . TONSILLECTOMY    . WISDOM TOOTH EXTRACTION     FHx:    Reviewed / unchanged  SHx:    Reviewed / unchanged  Systems Review:  Constitutional: Denies fever, chills, wt changes, headaches, insomnia, fatigue, night sweats, change in appetite. Eyes: Denies redness, blurred vision, diplopia, discharge, itchy, watery eyes.  ENT: Denies discharge, congestion, post nasal drip, epistaxis, sore throat, earache, hearing loss, dental pain, tinnitus, vertigo, sinus pain, snoring.  CV: Denies  chest pain, palpitations, irregular heartbeat, syncope, dyspnea, diaphoresis, orthopnea, PND, claudication or edema. Respiratory: denies cough, dyspnea, DOE, pleurisy, hoarseness, laryngitis, wheezing.  Gastrointestinal: Denies dysphagia, odynophagia, heartburn, reflux, water brash, abdominal pain or cramps, nausea, vomiting, bloating, diarrhea, constipation, hematemesis, melena, hematochezia  or hemorrhoids. Genitourinary: Denies dysuria, frequency, urgency, nocturia, hesitancy, discharge, hematuria or flank pain. Musculoskeletal: Denies arthralgias, myalgias, stiffness, jt. swelling, pain, limping or strain/sprain.  Skin: Denies pruritus, rash, hives, warts, acne, eczema or change in skin lesion(s). Neuro: No weakness, tremor, incoordination, spasms, paresthesia or pain. Psychiatric: Denies confusion, memory loss or sensory loss. Endo: Denies change in weight, skin or hair change.  Heme/Lymph: No excessive bleeding, bruising or enlarged lymph nodes.  Physical Exam  BP 130/84   Pulse 80   Temp (!) 97.5 F (36.4 C)   Resp 18   Ht 5' 11.75" (1.822 m)   Wt 230 lb 12.8 oz (104.7 kg)   BMI 31.52 kg/m   Appears over nourished, well groomed  and in no distress.  Eyes: PERRLA, EOMs, conjunctiva no swelling or erythema. Sinuses: No frontal/maxillary tenderness ENT/Mouth: EAC's clear, TM's nl w/o erythema, bulging. Nares clear w/o erythema, swelling, exudates. Oropharynx clear without erythema or exudates. Oral hygiene is good. Tongue normal, non obstructing. Hearing intact.  Neck: Supple. Thyroid nl. Car 2+/2+ without bruits, nodes or JVD. Chest: Respirations nl with BS clear & equal w/o rales, rhonchi, wheezing or stridor.  Cor: Heart sounds normal w/ regular rate and rhythm without sig. murmurs, gallops, clicks or rubs. Peripheral pulses normal and equal  without edema.  Abdomen: Soft & bowel sounds normal. Non-tender w/o guarding, rebound, hernias, masses or organomegaly.  Lymphatics:  Unremarkable.  Musculoskeletal: Full ROM all peripheral extremities, joint stability, 5/5 strength and normal gait.  Skin: scaly depigmented ares over the forearms to the wrists/hands.  Neuro: Cranial nerves intact, reflexes equal bilaterally. Sensory-motor testing grossly intact. Tendon reflexes grossly intact.  Pysch: Alert & oriented x 3.  Insight and judgement nl & appropriate. No ideations.  Assessment and Plan:  1. Essential hypertension  - Continue medication, monitor blood pressure at home.  - Continue DASH diet. Reminder to go to the ER if any CP,  SOB, nausea, dizziness, severe HA, changes vision/speech.  2. Hyperlipidemia, mixed  - Continue diet/meds, exercise,& lifestyle modifications.  - Continue monitor periodic cholesterol/liver & renal functions   3. Prediabetes  - Continue diet, exercise, lifestyle modifications.  - Monitor appropriate labs.  4. Vitamin D deficiency  - Continue supplementation.  5. Gout  6. 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 Medication management        Discussed  regular exercise, BP monitoring, weight control to achieve/maintain BMI less  than 25 and discussed med and SE's. Recommended labs to assess and monitor clinical status with further disposition pending results of labs. Over 30 minutes of exam, counseling, chart review was performed.

## 2016-12-29 NOTE — Patient Instructions (Signed)

## 2017-01-31 ENCOUNTER — Encounter: Payer: Self-pay | Admitting: Internal Medicine

## 2017-02-08 DIAGNOSIS — Z7982 Long term (current) use of aspirin: Secondary | ICD-10-CM | POA: Diagnosis not present

## 2017-02-08 DIAGNOSIS — R590 Localized enlarged lymph nodes: Secondary | ICD-10-CM | POA: Diagnosis not present

## 2017-02-08 DIAGNOSIS — Z7952 Long term (current) use of systemic steroids: Secondary | ICD-10-CM | POA: Diagnosis not present

## 2017-02-08 DIAGNOSIS — Z6831 Body mass index (BMI) 31.0-31.9, adult: Secondary | ICD-10-CM | POA: Diagnosis not present

## 2017-02-08 DIAGNOSIS — E669 Obesity, unspecified: Secondary | ICD-10-CM | POA: Diagnosis not present

## 2017-02-08 DIAGNOSIS — Z79899 Other long term (current) drug therapy: Secondary | ICD-10-CM | POA: Diagnosis not present

## 2017-02-08 DIAGNOSIS — E7800 Pure hypercholesterolemia, unspecified: Secondary | ICD-10-CM | POA: Diagnosis not present

## 2017-02-08 DIAGNOSIS — J45909 Unspecified asthma, uncomplicated: Secondary | ICD-10-CM | POA: Diagnosis not present

## 2017-02-08 DIAGNOSIS — I10 Essential (primary) hypertension: Secondary | ICD-10-CM | POA: Diagnosis not present

## 2017-02-08 DIAGNOSIS — M329 Systemic lupus erythematosus, unspecified: Secondary | ICD-10-CM | POA: Diagnosis not present

## 2017-03-24 ENCOUNTER — Other Ambulatory Visit: Payer: Self-pay | Admitting: Internal Medicine

## 2017-03-24 ENCOUNTER — Other Ambulatory Visit: Payer: BLUE CROSS/BLUE SHIELD

## 2017-03-24 DIAGNOSIS — I10 Essential (primary) hypertension: Secondary | ICD-10-CM | POA: Diagnosis not present

## 2017-03-24 DIAGNOSIS — M3219 Other organ or system involvement in systemic lupus erythematosus: Secondary | ICD-10-CM

## 2017-03-24 DIAGNOSIS — L931 Subacute cutaneous lupus erythematosus: Secondary | ICD-10-CM

## 2017-03-24 DIAGNOSIS — Z79899 Other long term (current) drug therapy: Secondary | ICD-10-CM

## 2017-03-24 LAB — BASIC METABOLIC PANEL WITH GFR
BUN / CREAT RATIO: 12 (calc) (ref 6–22)
BUN: 19 mg/dL (ref 7–25)
CO2: 26 mmol/L (ref 20–32)
CREATININE: 1.54 mg/dL — AB (ref 0.70–1.25)
Calcium: 8.7 mg/dL (ref 8.6–10.3)
Chloride: 98 mmol/L (ref 98–110)
GFR, EST NON AFRICAN AMERICAN: 48 mL/min/{1.73_m2} — AB (ref >=60)
GFR, Est African American: 56 mL/min/{1.73_m2} — ABNORMAL LOW (ref >=60)
GLUCOSE: 89 mg/dL (ref 65–99)
Potassium: 3.9 mmol/L (ref 3.5–5.3)
SODIUM: 135 mmol/L (ref 135–146)

## 2017-03-24 LAB — HEPATIC FUNCTION PANEL
AG RATIO: 0.8 (calc) — AB (ref 1.0–2.5)
ALBUMIN MSPROF: 3.9 g/dL (ref 3.6–5.1)
ALT: 41 U/L (ref 9–46)
AST: 90 U/L — ABNORMAL HIGH (ref 10–35)
Alkaline phosphatase (APISO): 49 U/L (ref 40–115)
BILIRUBIN DIRECT: 0.2 mg/dL (ref 0.0–0.2)
BILIRUBIN INDIRECT: 0.5 mg/dL (ref 0.2–1.2)
GLOBULIN: 4.7 g/dL — AB (ref 1.9–3.7)
Total Bilirubin: 0.7 mg/dL (ref 0.2–1.2)
Total Protein: 8.6 g/dL — ABNORMAL HIGH (ref 6.1–8.1)

## 2017-03-24 LAB — CBC WITH DIFFERENTIAL/PLATELET
BASOS PCT: 0.2 %
Basophils Absolute: 10 cells/uL (ref 0–200)
EOS ABS: 40 {cells}/uL (ref 15–500)
Eosinophils Relative: 0.8 %
HEMATOCRIT: 38.8 % (ref 38.5–50.0)
HEMOGLOBIN: 13.2 g/dL (ref 13.2–17.1)
LYMPHS ABS: 990 {cells}/uL (ref 850–3900)
MCH: 31.2 pg (ref 27.0–33.0)
MCHC: 34 g/dL (ref 32.0–36.0)
MCV: 91.7 fL (ref 80.0–100.0)
MPV: 11 fL (ref 7.5–12.5)
Monocytes Relative: 12.2 %
Neutro Abs: 3350 cells/uL (ref 1500–7800)
Neutrophils Relative %: 67 %
Platelets: 125 10*3/uL — ABNORMAL LOW (ref 140–400)
RBC: 4.23 10*6/uL (ref 4.20–5.80)
RDW: 12.7 % (ref 11.0–15.0)
Total Lymphocyte: 19.8 %
WBC: 5 10*3/uL (ref 3.8–10.8)
WBCMIX: 610 {cells}/uL (ref 200–950)

## 2017-03-24 MED ORDER — PREDNISONE 20 MG PO TABS: ORAL_TABLET | ORAL | 0 refills | Status: AC

## 2017-03-27 ENCOUNTER — Encounter: Payer: Self-pay | Admitting: *Deleted

## 2017-04-04 ENCOUNTER — Other Ambulatory Visit: Payer: Self-pay | Admitting: Internal Medicine

## 2017-04-04 DIAGNOSIS — M329 Systemic lupus erythematosus, unspecified: Principal | ICD-10-CM

## 2017-04-04 DIAGNOSIS — I7789 Other specified disorders of arteries and arterioles: Secondary | ICD-10-CM | POA: Insufficient documentation

## 2017-04-04 DIAGNOSIS — M3219 Other organ or system involvement in systemic lupus erythematosus: Secondary | ICD-10-CM

## 2017-04-05 ENCOUNTER — Other Ambulatory Visit: Payer: Self-pay | Admitting: Internal Medicine

## 2017-04-05 ENCOUNTER — Other Ambulatory Visit: Payer: BLUE CROSS/BLUE SHIELD

## 2017-04-05 DIAGNOSIS — I7789 Other specified disorders of arteries and arterioles: Secondary | ICD-10-CM

## 2017-04-05 DIAGNOSIS — F419 Anxiety disorder, unspecified: Secondary | ICD-10-CM

## 2017-04-05 DIAGNOSIS — I10 Essential (primary) hypertension: Secondary | ICD-10-CM | POA: Diagnosis not present

## 2017-04-05 DIAGNOSIS — M329 Systemic lupus erythematosus, unspecified: Principal | ICD-10-CM

## 2017-04-05 LAB — HEPATIC FUNCTION PANEL
AG Ratio: 0.9 (calc) — ABNORMAL LOW (ref 1.0–2.5)
ALT: 86 U/L — AB (ref 9–46)
AST: 70 U/L — AB (ref 10–35)
Albumin: 3.6 g/dL (ref 3.6–5.1)
Alkaline phosphatase (APISO): 75 U/L (ref 40–115)
BILIRUBIN DIRECT: 0.1 mg/dL (ref 0.0–0.2)
BILIRUBIN INDIRECT: 0.5 mg/dL (ref 0.2–1.2)
BILIRUBIN TOTAL: 0.6 mg/dL (ref 0.2–1.2)
Globulin: 4.2 g/dL (calc) — ABNORMAL HIGH (ref 1.9–3.7)
TOTAL PROTEIN: 7.8 g/dL (ref 6.1–8.1)

## 2017-04-05 MED ORDER — ALPRAZOLAM 1 MG PO TABS: ORAL_TABLET | ORAL | 0 refills | Status: DC

## 2017-04-06 ENCOUNTER — Other Ambulatory Visit: Payer: Self-pay | Admitting: Internal Medicine

## 2017-04-06 ENCOUNTER — Encounter: Payer: Self-pay | Admitting: *Deleted

## 2017-04-06 DIAGNOSIS — R7989 Other specified abnormal findings of blood chemistry: Secondary | ICD-10-CM

## 2017-04-06 DIAGNOSIS — R945 Abnormal results of liver function studies: Secondary | ICD-10-CM

## 2017-04-06 LAB — MICROALBUMIN / CREATININE URINE RATIO
Creatinine, Urine: 172 mg/dL (ref 20–320)
MICROALB UR: 4.3 mg/dL
MICROALB/CREAT RATIO: 25 ug/mg{creat} (ref <30)

## 2017-04-06 LAB — URINALYSIS, ROUTINE W REFLEX MICROSCOPIC
BACTERIA UA: NONE SEEN /HPF
Bilirubin Urine: NEGATIVE
Glucose, UA: NEGATIVE
HYALINE CAST: NONE SEEN /LPF
Ketones, ur: NEGATIVE
Leukocytes, UA: NEGATIVE
Nitrite: NEGATIVE
PH: 5.5 (ref 5.0–8.0)
Protein, ur: NEGATIVE
SPECIFIC GRAVITY, URINE: 1.018 (ref 1.001–1.03)
Squamous Epithelial / LPF: NONE SEEN /HPF (ref <=5)

## 2017-05-05 ENCOUNTER — Other Ambulatory Visit: Payer: BLUE CROSS/BLUE SHIELD

## 2017-05-08 ENCOUNTER — Ambulatory Visit
Admission: RE | Admit: 2017-05-08 | Discharge: 2017-05-08 | Disposition: A | Discharge status: Home / Self Care | Payer: BLUE CROSS/BLUE SHIELD | Source: Ambulatory Visit | Attending: Internal Medicine | Admitting: Internal Medicine

## 2017-05-08 DIAGNOSIS — R945 Abnormal results of liver function studies: Secondary | ICD-10-CM | POA: Diagnosis not present

## 2017-05-08 DIAGNOSIS — R7989 Other specified abnormal findings of blood chemistry: Secondary | ICD-10-CM

## 2017-06-13 ENCOUNTER — Other Ambulatory Visit: Payer: Self-pay | Admitting: Physician Assistant

## 2017-06-14 ENCOUNTER — Other Ambulatory Visit: Payer: Self-pay | Admitting: *Deleted

## 2017-06-14 MED ORDER — PREDNISONE 10 MG PO TABS: ORAL_TABLET | ORAL | 1 refills | Status: DC

## 2017-06-27 LAB — HM DIABETES EYE EXAM

## 2017-06-28 DIAGNOSIS — H2513 Age-related nuclear cataract, bilateral: Secondary | ICD-10-CM | POA: Diagnosis not present

## 2017-07-26 DIAGNOSIS — Z7952 Long term (current) use of systemic steroids: Secondary | ICD-10-CM | POA: Diagnosis not present

## 2017-07-26 DIAGNOSIS — M659 Synovitis and tenosynovitis, unspecified: Secondary | ICD-10-CM | POA: Diagnosis not present

## 2017-07-26 DIAGNOSIS — D696 Thrombocytopenia, unspecified: Secondary | ICD-10-CM | POA: Diagnosis not present

## 2017-07-26 DIAGNOSIS — M329 Systemic lupus erythematosus, unspecified: Secondary | ICD-10-CM | POA: Diagnosis not present

## 2017-07-31 ENCOUNTER — Encounter: Payer: Self-pay | Admitting: Internal Medicine

## 2017-08-13 NOTE — Patient Instructions (Signed)

## 2017-08-13 NOTE — Progress Notes (Signed)
[facility]   Sean Maldonado, M.D.     Sean Maldonado. Sean Maldonado, P.A.-C Sean Maldonado, Sean Maldonado                [address] [place], [place] [address] Telephone [phone] Telefax [phone] Annual  Screening/Preventative Visit  & Comprehensive Evaluation & Examination     This very nice 62 y.o. single WM  presents for a Screening/Preventative Visit & comprehensive evaluation and management of multiple medical co-morbidities.  Patient has been followed for HTN, HLD, Prediabetes and Vitamin D Deficiency. Patient was dx'd with cutaneous Lupus in 2017 and is followed at [facility]. Patient also has hx/o gout. Last fall (Aug 2018), patient had a severe intolerant or allergic reaction to Imuran. Since then his cutaneous Lupus symptoms seem reasonablycontrolled on Prednisone 10 mg /daily. He had recent Ophthalmology exam and continues amount. He does c/o burning neuropathy pains of his feet. Other problems include NASH.     HTN predates since 2004, altho treatment wasn't started until 2008. Patient's BP has been controlled at home.  Today's BP is at goal - 110/74. Patient denies any cardiac symptoms as chest pain, palpitations, shortness of breath, dizziness or ankle swelling.     Patient has  hyperlipidemia not controlled with diet and medications. Patient denies myalgias or other medication SE's. Last lipids were not at goal: Lab Results  Component Value Date   CHOL 166 07/28/2016   HDL 31 (L) 07/28/2016   LDLCALC 108 (H) 07/28/2016   TRIG 136 07/28/2016   CHOLHDL 5.4 (H) 07/28/2016      Patient has hx/o Morbid Obesity (BMI 34) and prediabetes (A1c 5.9%/2011 and 6.0%/2016) and patient denies reactive hypoglycemic symptoms, visual blurring, diabetic polys or paresthesias. Last A1c was Normal & at goal: Lab Results  Component Value Date   HGBA1C 5.4 07/28/2016       Finally, patient has history of  Vitamin D Deficiency ("42"/2008) and last vitamin D was at goal: Lab Results  Component Value Date   VD25OH 66 07/28/2016   Current Outpatient Medications on File Prior to Visit  Medication Sig  . aspirin EC 81 MG tablet Take 81 mg by mouth.  Marland Kitchen atenolol (TENORMIN) 100 MG tablet TAKE 1 TABLET DAILY FOR BLOOD PRESSURE.  Marland Kitchen Cholecalciferol (VITAMIN D3) 5000 UNITS CAPS Take 1 capsule by mouth daily.  . citalopram (CELEXA) 40 MG tablet Take 1 tablet (40 mg total) by mouth daily.  . hydrOXYzine (ATARAX/VISTARIL) 25 MG tablet TAKE 2 TABLETS THREE TIMES DAILY AS NEEDED FOR ITCHING.  Marland Kitchen lisinopril-hydrochlorothiazide (PRINZIDE,ZESTORETIC) 20-25 MG tablet TAKE 1 TABLET ONCE DAILY.  . Magnesium 400 MG TABS Take 1 tablet by mouth daily.  Marland Kitchen MILK THISTLE PO Take 1 tablet by mouth daily.  . Multiple Vitamin (MULTIVITAMIN) capsule Take 1 capsule by mouth daily.  . Omega-3 Fatty Acids (FISH OIL PO) Take 1 capsule by mouth daily.  Marland Kitchen OVER THE COUNTER MEDICATION Takes Alphalophoric Acid 1 capsule daily  . predniSONE (DELTASONE) 10 MG tablet Take 1-2 tablets daily as directed  . Probiotic Product (PROBIOTIC DAILY) CAPS Take 1 capsule by mouth daily.  Marland Kitchen triamcinolone cream (KENALOG) 0.1 % Apply 1 application topically 3 (three) times daily.  . TURMERIC PO Take 2,000 mg by mouth daily.   . [DISCONTINUED] fenofibrate micronized (LOFIBRA) 134 MG capsule Take 134 mg by mouth daily.  No current facility-administered medications on file prior to visit.    Allergies  Allergen Reactions  . Lipitor [Atorvastatin]     Myalgias   . Penicillins     Causes lupus flare ups  . Zocor [Simvastatin]     Myalgias   Past Medical History:  Diagnosis Date  . Asthma   . GERD (gastroesophageal reflux disease)   . Hypertension   . Lupus   . Prediabetes   . Seasonal allergies   . Vitamin D deficiency    Health Maintenance  Topic Date Due  . INFLUENZA VACCINE  12/07/2017  . COLONOSCOPY  07/31/2021  . TETANUS/TDAP   07/19/2022  . Hepatitis C Screening  Completed  . HIV Screening  Completed   Immunization History  Administered Date(s) Administered  . Influenza Split 02/26/2013, 02/09/2015  . PPD Test 07/22/2013, 07/23/2014, 07/24/2015  . Pneumococcal Polysaccharide-23 05/22/2008  . Tdap 07/18/2012   Last Colon - 3.25.2013 - Dr Sharlett Iles - Negative - Recc 10 yr f/u in Mar 2023          EGD  - 3.25.2013 - Dr Sharlett Iles - Dx Gastritis - Negative H.Pylori Bx  Past Surgical History:  Procedure Laterality Date  . AXILLARY LYMPH NODE BIOPSY Right 08/27/2012   Procedure: AXILLARY LYMPH NODE BIOPSY ;  Surgeon: Rolm Bookbinder, MD;  Location: [facility];  Service: General;  Laterality: Right;  . CHOLECYSTECTOMY N/A 12/28/2012   Procedure: LAPAROSCOPIC CHOLECYSTECTOMY WITH INTRAOPERATIVE CHOLANGIOGRAM;  Surgeon: Joyice Faster. Cornett, MD;  Location: [facility];  Service: General;  Laterality: N/A;  . TONSILLECTOMY    . WISDOM TOOTH EXTRACTION     Family History  Problem Relation Age of Onset  . Diabetes Mother   . Heart disease Mother   . Colon polyps Neg Hx   . Colon cancer Neg Hx    Social History   Socioeconomic History  . Marital status: Single  . Number of children: 0  Occupational History  . Occupation: [employer]  Tobacco Use  . Smoking status: Never Smoker  . Smokeless tobacco: Never Used  Substance and Sexual Activity  . Alcohol use: Yes    Comment: occasional  . Drug use: No    ROS Constitutional: Denies fever, chills, weight loss/gain, headaches, insomnia,  night sweats or change in appetite. Does c/o fatigue. Eyes: Denies redness, blurred vision, diplopia, discharge, itchy or watery eyes.  ENT: Denies discharge, congestion, post nasal drip, epistaxis, sore throat, earache, hearing loss, dental pain, Tinnitus, Vertigo, Sinus pain or snoring.  Cardio: Denies chest pain, palpitations, irregular heartbeat, syncope, dyspnea, diaphoresis, orthopnea, PND, claudication or  edema Respiratory: denies cough, dyspnea, DOE, pleurisy, hoarseness, laryngitis or wheezing.  Gastrointestinal: Denies dysphagia, heartburn, reflux, water brash, pain, cramps, nausea, vomiting, bloating, diarrhea, constipation, hematemesis, melena, hematochezia, jaundice or hemorrhoids Genitourinary: Denies dysuria, frequency, urgency, nocturia, hesitancy, discharge, hematuria or flank pain Musculoskeletal: Denies arthralgia, myalgia, stiffness, Jt. Swelling, pain, limp or strain/sprain. Denies Falls. Skin: Denies puritis, rash, hives, warts, acne, eczema or change in skin lesion Neuro: No weakness, tremor, incoordination, spasms, paresthesia or pain Psychiatric: Denies confusion, memory loss or sensory loss. Denies Depression. Endocrine: Denies change in weight, skin, hair change, nocturia, and paresthesia, diabetic polys, visual blurring or hyper / hypo glycemic episodes.  Heme/Lymph: No excessive bleeding, bruising or enlarged lymph nodes.  Physical Exam  BP 110/74   Pulse 72   Temp 97.9 F (36.6 C)   Resp 18   Ht 5' 10.5" (1.791 m)   Wt 240 lb  6.4 oz (109 kg)   BMI 34.01 kg/m   General Appearance: Over nourished and well groomed and in no apparent distress.  Eyes: PERRLA, EOMs, conjunctiva no swelling or erythema, normal fundi and vessels. Sinuses: No frontal/maxillary tenderness ENT/Mouth: EACs patent / TMs  nl. Nares clear without erythema, swelling, mucoid exudates. Oral hygiene is good. No erythema, swelling, or exudate. Tongue normal, non-obstructing. Tonsils not swollen or erythematous. Hearing normal.  Neck: Supple, thyroid not palpable. No bruits, nodes or JVD. Respiratory: Respiratory effort normal.  BS equal and clear bilateral without rales, rhonci, wheezing or stridor. Cardio: Heart sounds are normal with regular rate and rhythm and no murmurs, rubs or gallops. Peripheral pulses are normal and equal bilaterally without edema. No aortic or femoral bruits. Chest:  symmetric with normal excursions and percussion.  Abdomen: Soft, with Nl bowel sounds. Nontender, no guarding, rebound, hernias, masses, or organomegaly.  Lymphatics: Non tender without lymphadenopathy.  Genitourinary: No hernias.Testes nl. DRE - prostate nl for age - smooth & firm w/o nodules. Musculoskeletal: Full ROM all peripheral extremities, joint stability, 5/5 strength, and normal gait. Skin: Warm and dry without cyanosis, clubbing or  ecchymosis. Scattered erythematous to velvety pink to violaceous circumscribed lesions occasionally excoriated and some almost plaque-like over the scalp, trunk & extremities.   Neuro: Cranial nerves intact, reflexes equal bilaterally. Normal muscle tone, no cerebellar symptoms. Sensation intact.  Pysch: Alert and oriented X 3 with normal affect, insight and judgment appropriate.   Assessment and Plan  1. Annual Preventative/Screening Exam   2. Essential hypertension  - EKG 12-Lead - [place], RETROPERITNL ABD,  LTD - Urinalysis, Routine w reflex microscopic - Microalbumin / creatinine urine ratio - CBC with Differential/Platelet - BASIC METABOLIC PANEL WITH GFR - Magnesium - TSH  3. Hyperlipidemia, mixed  - EKG 12-Lead - [place], RETROPERITNL ABD,  LTD - Hepatic function panel - Lipid panel - TSH  4. Prediabetes  - EKG 12-Lead - [place], RETROPERITNL ABD,  LTD - Hemoglobin A1c - Insulin, random  5. Vitamin D deficiency  - VITAMIN D 25 Hydroxyl  6. Systemic lupus erythematosus, unspecified SLE type, unspecified organ involvement status ([place])  - Urinalysis, Routine w reflex microscopic - Microalbumin / creatinine urine ratio  7. History of gout  - Uric acid  8. Gastroesophageal reflux disease  - CBC with Differential/Platelet  9. NASH (nonalcoholic steatohepatitis)  - Hepatic function panel  10. Screening for colorectal cancer  - POC Hemoccult Bld/Stl  11. Screening for prostate cancer  - PSA  12. Testosterone deficiency  -  Testosterone  13. Screening for ischemic heart disease  - EKG 12-Lead  14. Screening for AAA (aortic abdominal aneurysm)  - [place], RETROPERITNL ABD,  LTD  15. Fatigue  - Iron,Total/Total Iron Binding Cap - Vitamin B12 - TSH  16. Medication management  - Urinalysis, Routine w reflex microscopic - Microalbumin / creatinine urine ratio - Testosterone - Uric acid - CBC with Differential/Platelet - BASIC METABOLIC PANEL WITH GFR - Hepatic function panel - Magnesium - Lipid panel - TSH - Hemoglobin A1c - Insulin, random - VITAMIN D 25 Hydroxyl  17. Polyneuropathy associated with underlying disease ([place])  - gabapentin (NEURONTIN) 100 MG capsule; Take 1 capsule up to 3  x/day for Neuropathy Pains  Dispense: 90 capsule; Refill: 0  18. Screening examination for pulmonary tuberculosis  - PPD          Patient was counseled in prudent diet, weight control to achieve/maintain BMI less than 25, BP monitoring, regular  exercise and medications as discussed.  Discussed med effects and SE's. Routine screening labs and tests as requested with regular follow-up as recommended. Over 40 minutes of exam, counseling, chart review and high complex critical decision making was performed

## 2017-08-14 ENCOUNTER — Encounter: Payer: Self-pay | Admitting: Internal Medicine

## 2017-08-14 ENCOUNTER — Ambulatory Visit: Payer: BLUE CROSS/BLUE SHIELD | Admitting: Internal Medicine

## 2017-08-14 VITALS — BP 110/74 | HR 72 | Temp 97.9°F | Resp 18 | Ht 70.5 in | Wt 240.4 lb

## 2017-08-14 DIAGNOSIS — Z0000 Encounter for general adult medical examination without abnormal findings: Secondary | ICD-10-CM | POA: Diagnosis not present

## 2017-08-14 DIAGNOSIS — E782 Mixed hyperlipidemia: Secondary | ICD-10-CM

## 2017-08-14 DIAGNOSIS — Z1211 Encounter for screening for malignant neoplasm of colon: Secondary | ICD-10-CM

## 2017-08-14 DIAGNOSIS — Z131 Encounter for screening for diabetes mellitus: Secondary | ICD-10-CM | POA: Diagnosis not present

## 2017-08-14 DIAGNOSIS — K219 Gastro-esophageal reflux disease without esophagitis: Secondary | ICD-10-CM

## 2017-08-14 DIAGNOSIS — M329 Systemic lupus erythematosus, unspecified: Secondary | ICD-10-CM

## 2017-08-14 DIAGNOSIS — Z79899 Other long term (current) drug therapy: Secondary | ICD-10-CM

## 2017-08-14 DIAGNOSIS — Z1329 Encounter for screening for other suspected endocrine disorder: Secondary | ICD-10-CM | POA: Diagnosis not present

## 2017-08-14 DIAGNOSIS — Z0001 Encounter for general adult medical examination with abnormal findings: Secondary | ICD-10-CM

## 2017-08-14 DIAGNOSIS — Z1389 Encounter for screening for other disorder: Secondary | ICD-10-CM | POA: Diagnosis not present

## 2017-08-14 DIAGNOSIS — Z111 Encounter for screening for respiratory tuberculosis: Secondary | ICD-10-CM | POA: Diagnosis not present

## 2017-08-14 DIAGNOSIS — K7581 Nonalcoholic steatohepatitis (NASH): Secondary | ICD-10-CM

## 2017-08-14 DIAGNOSIS — Z125 Encounter for screening for malignant neoplasm of prostate: Secondary | ICD-10-CM

## 2017-08-14 DIAGNOSIS — R5383 Other fatigue: Secondary | ICD-10-CM

## 2017-08-14 DIAGNOSIS — E559 Vitamin D deficiency, unspecified: Secondary | ICD-10-CM | POA: Diagnosis not present

## 2017-08-14 DIAGNOSIS — Z8739 Personal history of other diseases of the musculoskeletal system and connective tissue: Secondary | ICD-10-CM

## 2017-08-14 DIAGNOSIS — Z13220 Encounter for screening for lipoid disorders: Secondary | ICD-10-CM

## 2017-08-14 DIAGNOSIS — Z130 Encounter for screening for diseases of the blood and blood-forming organs and certain disorders involving the immune mechanism: Secondary | ICD-10-CM

## 2017-08-14 DIAGNOSIS — R7303 Prediabetes: Secondary | ICD-10-CM

## 2017-08-14 DIAGNOSIS — E349 Endocrine disorder, unspecified: Secondary | ICD-10-CM

## 2017-08-14 DIAGNOSIS — Z136 Encounter for screening for cardiovascular disorders: Secondary | ICD-10-CM

## 2017-08-14 DIAGNOSIS — I10 Essential (primary) hypertension: Secondary | ICD-10-CM

## 2017-08-14 DIAGNOSIS — G63 Polyneuropathy in diseases classified elsewhere: Secondary | ICD-10-CM

## 2017-08-14 DIAGNOSIS — Z1212 Encounter for screening for malignant neoplasm of rectum: Secondary | ICD-10-CM

## 2017-08-14 MED ORDER — GABAPENTIN 100 MG PO CAPS: ORAL_CAPSULE | ORAL | 0 refills | Status: DC

## 2017-08-15 ENCOUNTER — Other Ambulatory Visit: Payer: Self-pay | Admitting: Internal Medicine

## 2017-08-15 DIAGNOSIS — E790 Hyperuricemia without signs of inflammatory arthritis and tophaceous disease: Secondary | ICD-10-CM

## 2017-08-15 LAB — BASIC METABOLIC PANEL WITH GFR
BUN: 24 mg/dL (ref 7–25)
CALCIUM: 9.3 mg/dL (ref 8.6–10.3)
CO2: 30 mmol/L (ref 20–32)
CREATININE: 1.21 mg/dL (ref 0.70–1.25)
Chloride: 98 mmol/L (ref 98–110)
GFR, EST AFRICAN AMERICAN: 75 mL/min/{1.73_m2} (ref >=60)
GFR, EST NON AFRICAN AMERICAN: 65 mL/min/{1.73_m2} (ref >=60)
Glucose, Bld: 151 mg/dL — ABNORMAL HIGH (ref 65–99)
POTASSIUM: 4 mmol/L (ref 3.5–5.3)
Sodium: 136 mmol/L (ref 135–146)

## 2017-08-15 LAB — CBC WITH DIFFERENTIAL/PLATELET
BASOS ABS: 18 {cells}/uL (ref 0–200)
BASOS PCT: 0.3 %
EOS PCT: 0.8 %
Eosinophils Absolute: 47 cells/uL (ref 15–500)
HEMATOCRIT: 38.5 % (ref 38.5–50.0)
HEMOGLOBIN: 13.3 g/dL (ref 13.2–17.1)
LYMPHS ABS: 956 {cells}/uL (ref 850–3900)
MCH: 31.4 pg (ref 27.0–33.0)
MCHC: 34.5 g/dL (ref 32.0–36.0)
MCV: 91 fL (ref 80.0–100.0)
MPV: 10.1 fL (ref 7.5–12.5)
Monocytes Relative: 10.3 %
Neutro Abs: 4272 cells/uL (ref 1500–7800)
Neutrophils Relative %: 72.4 %
Platelets: 199 10*3/uL (ref 140–400)
RBC: 4.23 10*6/uL (ref 4.20–5.80)
RDW: 12.5 % (ref 11.0–15.0)
Total Lymphocyte: 16.2 %
WBC mixed population: 608 cells/uL (ref 200–950)
WBC: 5.9 10*3/uL (ref 3.8–10.8)

## 2017-08-15 LAB — HEPATIC FUNCTION PANEL
AG RATIO: 1.3 (calc) (ref 1.0–2.5)
ALBUMIN MSPROF: 4.4 g/dL (ref 3.6–5.1)
ALT: 40 U/L (ref 9–46)
AST: 67 U/L — AB (ref 10–35)
Alkaline phosphatase (APISO): 45 U/L (ref 40–115)
BILIRUBIN DIRECT: 0.2 mg/dL (ref 0.0–0.2)
BILIRUBIN TOTAL: 0.9 mg/dL (ref 0.2–1.2)
GLOBULIN: 3.3 g/dL (ref 1.9–3.7)
Indirect Bilirubin: 0.7 mg/dL (calc) (ref 0.2–1.2)
Total Protein: 7.7 g/dL (ref 6.1–8.1)

## 2017-08-15 LAB — TESTOSTERONE: Testosterone: 455 ng/dL (ref 250–827)

## 2017-08-15 LAB — LIPID PANEL
Cholesterol: 206 mg/dL — ABNORMAL HIGH (ref <200)
HDL: 26 mg/dL — AB (ref >40)
LDL CHOLESTEROL (CALC): 149 mg/dL — AB
NON-HDL CHOLESTEROL (CALC): 180 mg/dL — AB (ref <130)
TRIGLYCERIDES: 169 mg/dL — AB (ref <150)
Total CHOL/HDL Ratio: 7.9 (calc) — ABNORMAL HIGH (ref <5.0)

## 2017-08-15 LAB — MICROALBUMIN / CREATININE URINE RATIO
Creatinine, Urine: 203 mg/dL (ref 20–320)
Microalb Creat Ratio: 17 mcg/mg creat (ref <30)
Microalb, Ur: 3.5 mg/dL

## 2017-08-15 LAB — URINALYSIS, ROUTINE W REFLEX MICROSCOPIC
BILIRUBIN URINE: NEGATIVE
GLUCOSE, UA: NEGATIVE
Hgb urine dipstick: NEGATIVE
Leukocytes, UA: NEGATIVE
Nitrite: NEGATIVE
Protein, ur: NEGATIVE
Specific Gravity, Urine: 1.02 (ref 1.001–1.03)
pH: 6 (ref 5.0–8.0)

## 2017-08-15 LAB — HEMOGLOBIN A1C
EAG (MMOL/L): 7 (calc)
Hgb A1c MFr Bld: 6 % of total Hgb — ABNORMAL HIGH (ref <5.7)
MEAN PLASMA GLUCOSE: 126 (calc)

## 2017-08-15 LAB — TSH: TSH: 1.7 mIU/L (ref 0.40–4.50)

## 2017-08-15 LAB — VITAMIN B12: VITAMIN B 12: 543 pg/mL (ref 200–1100)

## 2017-08-15 LAB — URIC ACID: URIC ACID, SERUM: 11.7 mg/dL — AB (ref 4.0–8.0)

## 2017-08-15 LAB — VITAMIN D 25 HYDROXY (VIT D DEFICIENCY, FRACTURES): VIT D 25 HYDROXY: 65 ng/mL (ref 30–100)

## 2017-08-15 LAB — PSA: PSA: 0.3 ng/mL (ref <=4.0)

## 2017-08-15 LAB — IRON, TOTAL/TOTAL IRON BINDING CAP
%SAT: 41 % (calc) (ref 15–60)
Iron: 100 ug/dL (ref 50–180)
TIBC: 244 mcg/dL (calc) — ABNORMAL LOW (ref 250–425)

## 2017-08-15 LAB — MAGNESIUM: Magnesium: 1.8 mg/dL (ref 1.5–2.5)

## 2017-08-15 LAB — INSULIN, RANDOM: INSULIN: 35.6 u[IU]/mL — AB (ref 2.0–19.6)

## 2017-08-15 MED ORDER — ALLOPURINOL 300 MG PO TABS: ORAL_TABLET | ORAL | 1 refills | Status: DC

## 2017-08-16 LAB — TB SKIN TEST
INDURATION: 0 mm
TB Skin Test: NEGATIVE

## 2017-09-04 ENCOUNTER — Other Ambulatory Visit: Payer: Self-pay | Admitting: Internal Medicine

## 2017-09-04 DIAGNOSIS — F419 Anxiety disorder, unspecified: Secondary | ICD-10-CM

## 2017-09-04 MED ORDER — ALPRAZOLAM 1 MG PO TABS: ORAL_TABLET | ORAL | 0 refills | Status: AC

## 2017-11-10 NOTE — Progress Notes (Signed)
FOLLOW UP  Assessment and Plan:   Hypertension Well controlled with current medications  Monitor blood pressure at home; patient to call if consistently greater than 130/80 Continue DASH diet.   Reminder to go to the ER if any CP, SOB, nausea, dizziness, severe HA, changes vision/speech, left arm numbness and tingling and jaw pain.  Cholesterol Currently above goal; intolerant of statins, zetia ineffective and very high copay. Will work on diet, recent elevation likely r/t very poor diet since starting on prednisone Continue low cholesterol diet and exercise.  Check lipid panel.   NASH Weight loss and low GI diet advised, will monitor LFTs  Prediabetes Continue diet and exercise.  Perform daily foot/skin check, notify office of any concerning changes.  Check A1C  Obesity with co morbidities Long discussion about weight loss, diet, and exercise Recommended diet heavy in fruits and veggies and low in animal meats, cheeses, and dairy products, appropriate calorie intake Discussed ideal weight for height  Patient will work on low GI diet, whole foods/plant based for antiinflammatory benefits discussed at length Will follow up in 3 months  Vitamin D Def At goal at last visit; continue supplementation to maintain goal of 70-100 Defer Vit D level  Gout Continue allopurinol Diet discussed Check uric acid as needed   Lupus Intolerant of imuran Stable on prednisone; continue to monitor Followed by Jeanmarie Plant  Continue diet and meds as discussed. Further disposition pending results of labs. Discussed med's effects and SE's.   Over 30 minutes of exam, counseling, chart review, and critical decision making was performed.   Future Appointments  Date Time Provider [place]  02/13/2018  9:30 AM [name], MD [facility] None  09/06/2018  9:00 AM [name], MD [facility] None     ----------------------------------------------------------------------------------------------------------------------  HPI 62 y.o. male  presents for 3 month follow up on hypertension, cholesterol, prediabetes, obesity, gout and vitamin D deficiency. Patient was diagnosed with cutaneous Lupus in 2017 and is followed at [facility].  Last fall (Aug 2018), patient had a severe intolerant or allergic reaction to Imuran. Since then his cutaneous Lupus symptoms seem reasonablycontrolled on Prednisone 10 mg /daily.   BMI is Body mass index is 34.94 kg/m., he has been working on exercise, admits to poor diet, drinking sodas and eating sweets.  Wt Readings from Last 3 Encounters:  11/13/17 247 lb (112 kg)  08/14/17 240 lb 6.4 oz (109 kg)  12/29/16 230 lb 12.8 oz (104.7 kg)   His blood pressure has been controlled at home, today their BP is BP: 124/88  He does workout. He denies chest pain, shortness of breath, dizziness.   He is on cholesterol medication (cannot tolerate lipitor, zocor, insignificant progress with zetia, fenofibrate) and denies myalgias. His cholesterol is not at goal. The cholesterol last visit was:   Lab Results  Component Value Date   CHOL 206 (H) 08/14/2017   HDL 26 (L) 08/14/2017   LDLCALC 149 (H) 08/14/2017   TRIG 169 (H) 08/14/2017   CHOLHDL 7.9 (H) 08/14/2017    He has been working on diet and exercise for prediabetes, and denies foot ulcerations, increased appetite, nausea, polydipsia, polyuria, visual disturbances, vomiting and weight loss. Last A1C in the office was:  Lab Results  Component Value Date   HGBA1C 6.0 (H) 08/14/2017   Patient is on Vitamin D supplement.   Lab Results  Component Value Date   VD25OH 65 08/14/2017     Patient has hx of gout though has stopped taking  allopurinol, controlled on low dose prednisone, and does not report a recent flare.  Lab Results  Component Value Date   LABURIC 11.7 (H) 08/14/2017      Current  Medications:  Current Outpatient Medications on File Prior to Visit  Medication Sig  . allopurinol (ZYLOPRIM) 300 MG tablet Take 1 tablet daily to prevent Gout Attack  . aspirin EC 81 MG tablet Take 81 mg by mouth.  Marland Kitchen atenolol (TENORMIN) 100 MG tablet TAKE 1 TABLET DAILY FOR BLOOD PRESSURE.  Marland Kitchen Cholecalciferol (VITAMIN D3) 5000 UNITS CAPS Take 1 capsule by mouth daily.  . citalopram (CELEXA) 40 MG tablet Take 1 tablet (40 mg total) by mouth daily.  Marland Kitchen gabapentin (NEURONTIN) 100 MG capsule Take 1 capsule up to 3  x/day for Neuropathy Pains  . hydrOXYzine (ATARAX/VISTARIL) 25 MG tablet TAKE 2 TABLETS THREE TIMES DAILY AS NEEDED FOR ITCHING.  Marland Kitchen lisinopril-hydrochlorothiazide (PRINZIDE,ZESTORETIC) 20-25 MG tablet TAKE 1 TABLET ONCE DAILY.  . Magnesium 400 MG TABS Take 1 tablet by mouth daily.  Marland Kitchen MILK THISTLE PO Take 1 tablet by mouth daily.  . Multiple Vitamin (MULTIVITAMIN) capsule Take 1 capsule by mouth daily.  . Omega-3 Fatty Acids (FISH OIL PO) Take 1 capsule by mouth daily.  Marland Kitchen OVER THE COUNTER MEDICATION Takes Alphalophoric Acid 1 capsule daily  . predniSONE (DELTASONE) 10 MG tablet Take 1-2 tablets daily as directed  . Probiotic Product (PROBIOTIC DAILY) CAPS Take 1 capsule by mouth daily.  Marland Kitchen triamcinolone cream (KENALOG) 0.1 % Apply 1 application topically 3 (three) times daily.  . TURMERIC PO Take 2,000 mg by mouth daily.   . [DISCONTINUED] fenofibrate micronized (LOFIBRA) 134 MG capsule Take 134 mg by mouth daily.   No current facility-administered medications on file prior to visit.      Allergies:  Allergies  Allergen Reactions  . Imuran [Azathioprine] Rash  . Lipitor [Atorvastatin]     Myalgias   . Penicillins     Causes lupus flare ups  . Zocor [Simvastatin]     Myalgias     Medical History:  Past Medical History:  Diagnosis Date  . Asthma   . GERD (gastroesophageal reflux disease)   . Hypertension   . Lupus ([place])   . Prediabetes   . Seasonal allergies   .  Vitamin D deficiency    Family history- Reviewed and unchanged Social history- Reviewed and unchanged   Review of Systems:  Review of Systems  Constitutional: Negative for malaise/fatigue and weight loss.  HENT: Negative for hearing loss and tinnitus.   Eyes: Negative for blurred vision and double vision.  Respiratory: Negative for cough, shortness of breath and wheezing.   Cardiovascular: Negative for chest pain, palpitations, orthopnea, claudication and leg swelling.  Gastrointestinal: Negative for abdominal pain, blood in stool, constipation, diarrhea, heartburn, melena, nausea and vomiting.  Genitourinary: Negative.   Musculoskeletal: Negative for joint pain and myalgias.  Skin: Positive for rash.  Neurological: Positive for tingling (bilateral feet). Negative for dizziness, sensory change, weakness and headaches.  Endo/Heme/Allergies: Negative for polydipsia.  Psychiatric/Behavioral: Negative.   All other systems reviewed and are negative.   Physical Exam: BP 124/88   Pulse 71   Temp (!) 97.3 F (36.3 C)   Ht 5' 10.5" (1.791 m)   Wt 247 lb (112 kg)   SpO2 97%   BMI 34.94 kg/m  Wt Readings from Last 3 Encounters:  11/13/17 247 lb (112 kg)  08/14/17 240 lb 6.4 oz (109 kg)  12/29/16 230 lb 12.8  oz (104.7 kg)     General Appearance: Well nourished, in no apparent distress. Eyes: PERRLA, EOMs, conjunctiva no swelling or erythema Sinuses: No Frontal/maxillary tenderness ENT/Mouth: Ext aud canals clear, TMs without erythema, bulging. No erythema, swelling, or exudate on post pharynx.  Tonsils not swollen or erythematous. Hearing normal.  Neck: Supple, thyroid normal.  Respiratory: Respiratory effort normal, BS equal bilaterally without rales, rhonchi, wheezing or stridor.  Cardio: RRR with no MRGs. Brisk peripheral pulses without edema.  Abdomen: Soft, + BS.  Non tender, no guarding, rebound, hernias, masses. Lymphatics: Non tender without lymphadenopathy.   Musculoskeletal: Full ROM, 5/5 strength, Normal gait Skin: Warm, dry; Scattered erythematous to velvety pink to violaceous circumscribed lesions occasionally excoriated and some almost plaque-like over the scalp, trunk & extremities.  Neuro: Cranial nerves intact. No cerebellar symptoms.  Psych: Awake and oriented X 3, normal affect, Insight and Judgment appropriate.    Izora Ribas, NP 11:52 AM [facility]

## 2017-11-13 ENCOUNTER — Ambulatory Visit: Payer: BLUE CROSS/BLUE SHIELD | Admitting: Adult Health

## 2017-11-13 ENCOUNTER — Encounter: Payer: Self-pay | Admitting: Adult Health

## 2017-11-13 VITALS — BP 124/88 | HR 71 | Temp 97.3°F | Ht 70.5 in | Wt 247.0 lb

## 2017-11-13 DIAGNOSIS — M1000 Idiopathic gout, unspecified site: Secondary | ICD-10-CM | POA: Diagnosis not present

## 2017-11-13 DIAGNOSIS — E669 Obesity, unspecified: Secondary | ICD-10-CM | POA: Diagnosis not present

## 2017-11-13 DIAGNOSIS — E782 Mixed hyperlipidemia: Secondary | ICD-10-CM | POA: Diagnosis not present

## 2017-11-13 DIAGNOSIS — R7303 Prediabetes: Secondary | ICD-10-CM | POA: Diagnosis not present

## 2017-11-13 DIAGNOSIS — K7581 Nonalcoholic steatohepatitis (NASH): Secondary | ICD-10-CM

## 2017-11-13 DIAGNOSIS — M3219 Other organ or system involvement in systemic lupus erythematosus: Secondary | ICD-10-CM

## 2017-11-13 DIAGNOSIS — I7789 Other specified disorders of arteries and arterioles: Secondary | ICD-10-CM

## 2017-11-13 DIAGNOSIS — I10 Essential (primary) hypertension: Secondary | ICD-10-CM

## 2017-11-13 DIAGNOSIS — E559 Vitamin D deficiency, unspecified: Secondary | ICD-10-CM | POA: Diagnosis not present

## 2017-11-13 DIAGNOSIS — M329 Systemic lupus erythematosus, unspecified: Secondary | ICD-10-CM

## 2017-11-13 DIAGNOSIS — G63 Polyneuropathy in diseases classified elsewhere: Secondary | ICD-10-CM

## 2017-11-13 DIAGNOSIS — Z79899 Other long term (current) drug therapy: Secondary | ICD-10-CM

## 2017-11-13 MED ORDER — GABAPENTIN 100 MG PO CAPS: ORAL_CAPSULE | ORAL | 0 refills | Status: DC

## 2017-11-13 MED ORDER — ATENOLOL 100 MG PO TABS: 100.0000 mg | ORAL_TABLET | Freq: Every day | ORAL | 1 refills | Status: DC

## 2017-11-13 NOTE — Patient Instructions (Signed)
Recommend a plant based, whole foods diet to reduce overall inflammation  Focus on fresh/frozen fruits and vegetables, beans, nuts, whole grains (brown rice, quinoa, etc over a whole grain bread which is more processed), extra virgin olive oil, and fish if you choose to consume this  Treat red meat, flour, sugars as rare splurge items  Recommend avoiding: highly processed foods, sugar, flour, preserved meats (sausage, etc)  May also look into low glycemic index diet - also low inflammatory, also proven to improve fatty liver   Aim for 7+ servings of fruits and vegetables daily  80+ fluid ounces of water or unsweet tea for healthy kidneys  Avoid alcohol/tobacco  Limit animal fats in diet for cholesterol and heart health - choose grass fed whenever available  Aim for low stress - take time to unwind and care for your mental health  Aim for 150 min of moderate intensity exercise weekly for heart health, and weights twice weekly for bone health  Aim for 7-9 hours of sleep daily      When it comes to diets, agreement about the perfect plan isn't easy to find, even among the experts. Experts at the [employer] developed an idea known as the Healthy Eating Plate. Just imagine a plate divided into logical, healthy portions.  The emphasis is on diet quality:  Load up on vegetables and fruits - one-half of your plate: Aim for color and variety, and remember that potatoes don't count.  Go for whole grains - one-quarter of your plate: Whole wheat, barley, wheat berries, quinoa, oats, brown rice, and foods made with them. If you want pasta, go with whole wheat pasta.  Protein power - one-quarter of your plate: Fish, chicken, beans, and nuts are all healthy, versatile protein sources. Limit red meat.  The diet, however, does go beyond the plate, offering a few other suggestions.  Use healthy plant oils, such as olive, canola, soy, corn, sunflower and peanut. Check the  labels, and avoid partially hydrogenated oil, which have unhealthy trans fats.  If you're thirsty, drink water. Coffee and tea are good in moderation, but skip sugary drinks and limit milk and dairy products to one or two daily servings.  The type of carbohydrate in the diet is more important than the amount. Some sources of carbohydrates, such as vegetables, fruits, whole grains, and beans-are healthier than others.  Finally, stay active.

## 2017-11-14 LAB — COMPLETE METABOLIC PANEL WITH GFR
AG Ratio: 1.2 (calc) (ref 1.0–2.5)
ALBUMIN MSPROF: 4.1 g/dL (ref 3.6–5.1)
ALKALINE PHOSPHATASE (APISO): 48 U/L (ref 40–115)
ALT: 48 U/L — ABNORMAL HIGH (ref 9–46)
AST: 54 U/L — ABNORMAL HIGH (ref 10–35)
BUN: 13 mg/dL (ref 7–25)
CO2: 25 mmol/L (ref 20–32)
CREATININE: 1.14 mg/dL (ref 0.70–1.25)
Calcium: 9.1 mg/dL (ref 8.6–10.3)
Chloride: 104 mmol/L (ref 98–110)
GFR, EST AFRICAN AMERICAN: 81 mL/min/{1.73_m2} (ref >=60)
GFR, EST NON AFRICAN AMERICAN: 70 mL/min/{1.73_m2} (ref >=60)
Globulin: 3.4 g/dL (calc) (ref 1.9–3.7)
Glucose, Bld: 102 mg/dL — ABNORMAL HIGH (ref 65–99)
Potassium: 4.5 mmol/L (ref 3.5–5.3)
SODIUM: 139 mmol/L (ref 135–146)
TOTAL PROTEIN: 7.5 g/dL (ref 6.1–8.1)
Total Bilirubin: 0.5 mg/dL (ref 0.2–1.2)

## 2017-11-14 LAB — CBC WITH DIFFERENTIAL/PLATELET
BASOS ABS: 24 {cells}/uL (ref 0–200)
BASOS PCT: 0.3 %
EOS PCT: 1.3 %
Eosinophils Absolute: 103 cells/uL (ref 15–500)
HEMATOCRIT: 37.5 % — AB (ref 38.5–50.0)
HEMOGLOBIN: 12.8 g/dL — AB (ref 13.2–17.1)
Lymphs Abs: 830 cells/uL — ABNORMAL LOW (ref 850–3900)
MCH: 31.7 pg (ref 27.0–33.0)
MCHC: 34.1 g/dL (ref 32.0–36.0)
MCV: 92.8 fL (ref 80.0–100.0)
MPV: 10.6 fL (ref 7.5–12.5)
Monocytes Relative: 9.4 %
NEUTROS ABS: 6202 {cells}/uL (ref 1500–7800)
Neutrophils Relative %: 78.5 %
Platelets: 217 10*3/uL (ref 140–400)
RBC: 4.04 10*6/uL — ABNORMAL LOW (ref 4.20–5.80)
RDW: 13 % (ref 11.0–15.0)
Total Lymphocyte: 10.5 %
WBC mixed population: 743 cells/uL (ref 200–950)
WBC: 7.9 10*3/uL (ref 3.8–10.8)

## 2017-11-14 LAB — TSH: TSH: 1.56 m[IU]/L (ref 0.40–4.50)

## 2017-11-14 LAB — LIPID PANEL
CHOL/HDL RATIO: 7.5 (calc) — AB (ref <5.0)
Cholesterol: 217 mg/dL — ABNORMAL HIGH (ref <200)
HDL: 29 mg/dL — ABNORMAL LOW (ref >40)
LDL CHOLESTEROL (CALC): 149 mg/dL — AB
NON-HDL CHOLESTEROL (CALC): 188 mg/dL — AB (ref <130)
Triglycerides: 255 mg/dL — ABNORMAL HIGH (ref <150)

## 2017-11-14 LAB — HEMOGLOBIN A1C
HEMOGLOBIN A1C: 5.7 %{Hb} — AB (ref <5.7)
MEAN PLASMA GLUCOSE: 117 (calc)
eAG (mmol/L): 6.5 (calc)

## 2017-11-14 LAB — URIC ACID: URIC ACID, SERUM: 8.9 mg/dL — AB (ref 4.0–8.0)

## 2017-12-27 ENCOUNTER — Other Ambulatory Visit: Payer: Self-pay | Admitting: Internal Medicine

## 2018-01-21 ENCOUNTER — Other Ambulatory Visit: Payer: Self-pay | Admitting: Internal Medicine

## 2018-01-31 DIAGNOSIS — Z7952 Long term (current) use of systemic steroids: Secondary | ICD-10-CM | POA: Diagnosis not present

## 2018-01-31 DIAGNOSIS — Z23 Encounter for immunization: Secondary | ICD-10-CM | POA: Diagnosis not present

## 2018-01-31 DIAGNOSIS — R635 Abnormal weight gain: Secondary | ICD-10-CM | POA: Diagnosis not present

## 2018-01-31 DIAGNOSIS — M3219 Other organ or system involvement in systemic lupus erythematosus: Secondary | ICD-10-CM | POA: Diagnosis not present

## 2018-02-12 ENCOUNTER — Other Ambulatory Visit: Payer: Self-pay | Admitting: Adult Health

## 2018-02-12 DIAGNOSIS — G63 Polyneuropathy in diseases classified elsewhere: Secondary | ICD-10-CM

## 2018-02-13 ENCOUNTER — Ambulatory Visit: Payer: Self-pay | Admitting: Internal Medicine

## 2018-04-09 ENCOUNTER — Other Ambulatory Visit: Payer: Self-pay | Admitting: Internal Medicine

## 2018-04-09 MED ORDER — PREDNISONE 10 MG PO TABS: ORAL_TABLET | ORAL | 0 refills | Status: DC

## 2018-04-15 ENCOUNTER — Encounter: Payer: Self-pay | Admitting: Internal Medicine

## 2018-04-15 NOTE — Progress Notes (Signed)
This very nice 62 y.o. single WM presents for 6 month follow up with HTN, HLD, Pre-Diabetes and Vitamin D Deficiency. 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 He also is on Allopurinol for hx/o Gout apparently controlled. Patient relates that his mother recently died and he feels overwhelmed closing her estate and cleaning out her house.     Patient has hx/o HTN (2004)- then starting Tx in 2008. BP has been controlled at home. Today's BP is elevated at 152/98 which he attributes to stress of his mother's recent demise. Patient has had no complaints of any cardiac type chest pain, palpitations, dyspnea / orthopnea / PND, dizziness, claudication, or dependent edema.     Hyperlipidemia is not controlled with diet & he is allergic/intolerant to statins.  Patient denies myalgias or other med SE's. Last Lipids were not at goal: Lab Results  Component Value Date   CHOL 217 (H) 11/13/2017   HDL 29 (L) 11/13/2017   LDLCALC 149 (H) 11/13/2017   TRIG 255 (H) 11/13/2017   CHOLHDL 7.5 (H) 11/13/2017      Also, the patient has history of  Morbid Obesity (BMI 34) and PreDiabetes (A1c 5.9% / 2011 and 6.0% / 2016)   and has had no symptoms of reactive hypoglycemia, diabetic polys, paresthesias or visual blurring.  Last A1c was almost to goal: Lab Results  Component Value Date   HGBA1C 5.7 (H) 11/13/2017      Further, the patient also has history of Vitamin D Deficiency  ("42" / 2008)  and supplements vitamin D without any suspected side-effects. Last vitamin D was at goal: Lab Results  Component Value Date   VD25OH 65 08/14/2017   Current Outpatient Medications on File Prior to Visit  Medication Sig  . allopurinol (ZYLOPRIM) 300 MG tablet Take 300 mg by mouth daily.  Marland Kitchen aspirin EC 81 MG tablet Take 81 mg by mouth.  Marland Kitchen atenolol (TENORMIN) 100 MG tablet Take 1 tablet (100 mg total) by mouth daily. for blood pressure  . Cholecalciferol (VITAMIN D3) 5000 UNITS CAPS Take 1 capsule  by mouth daily.  Marland Kitchen gabapentin (NEURONTIN) 100 MG capsule TAKE 1 CAPSULE UP TO 3 TIMES DAILY FOR NEUROPATHY PAIN.  . hydrOXYzine (ATARAX/VISTARIL) 25 MG tablet TAKE 2 TABLETS THREE TIMES DAILY AS NEEDED FOR ITCHING.  Marland Kitchen lisinopril-hydrochlorothiazide (PRINZIDE,ZESTORETIC) 20-25 MG tablet TAKE 1 TABLET ONCE DAILY.  . Magnesium 400 MG TABS Take 1 tablet by mouth daily.  Marland Kitchen MILK THISTLE PO Take 1 tablet by mouth daily.  . Multiple Vitamin (MULTIVITAMIN) capsule Take 1 capsule by mouth daily.  . Omega-3 Fatty Acids (FISH OIL PO) Take 1 capsule by mouth daily.  Marland Kitchen OVER THE COUNTER MEDICATION Takes Alphalophoric Acid 1 capsule daily  . predniSONE (DELTASONE) 10 MG tablet TAKE 1 OR 2 TABLETS DAILY AS DIRECTED BY DR.  Marland Kitchen Probiotic Product (PROBIOTIC DAILY) CAPS Take 1 capsule by mouth daily.  Marland Kitchen triamcinolone cream (KENALOG) 0.1 % Apply 1 application topically 3 (three) times daily.  . TURMERIC PO Take 2,000 mg by mouth daily.   . [DISCONTINUED] fenofibrate micronized (LOFIBRA) 134 MG capsule Take 134 mg by mouth daily.   No current facility-administered medications on file prior to visit.    Allergies  Allergen Reactions  . Imuran [Azathioprine] Rash  . Lipitor [Atorvastatin]     Myalgias   . Penicillins     Causes lupus flare ups  . Zocor [Simvastatin]     Myalgias  PMHx:   Past Medical History:  Diagnosis Date  . Asthma   . GERD (gastroesophageal reflux disease)   . Hypertension   . Lupus ([place])   . Prediabetes   . Seasonal allergies   . Vitamin D deficiency    Immunization History  Administered Date(s) Administered  . Influenza Split 02/26/2013, 02/09/2015  . PPD Test 07/22/2013, 07/23/2014, 07/24/2015, 08/14/2017  . Pneumococcal Polysaccharide-23 05/22/2008  . Tdap 07/18/2012   Past Surgical History:  Procedure Laterality Date  . AXILLARY LYMPH NODE BIOPSY Right 08/27/2012   Procedure: AXILLARY LYMPH NODE BIOPSY ;  Surgeon: Rolm Bookbinder, MD;  Location: [facility];  Service:  General;  Laterality: Right;  . CHOLECYSTECTOMY N/A 12/28/2012   Procedure: LAPAROSCOPIC CHOLECYSTECTOMY WITH INTRAOPERATIVE CHOLANGIOGRAM;  Surgeon: Joyice Faster. Cornett, MD;  Location: [facility];  Service: General;  Laterality: N/A;  . TONSILLECTOMY    . WISDOM TOOTH EXTRACTION     FHx:    Reviewed / unchanged  SHx:    Reviewed / unchanged   Systems Review:  Constitutional: Denies fever, chills, wt changes, headaches, insomnia, fatigue, night sweats, change in appetite. Eyes: Denies redness, blurred vision, diplopia, discharge, itchy, watery eyes.  ENT: Denies discharge, congestion, post nasal drip, epistaxis, sore throat, earache, hearing loss, dental pain, tinnitus, vertigo, sinus pain, snoring.  CV: Denies chest pain, palpitations, irregular heartbeat, syncope, dyspnea, diaphoresis, orthopnea, PND, claudication or edema. Respiratory: denies cough, dyspnea, DOE, pleurisy, hoarseness, laryngitis, wheezing.  Gastrointestinal: Denies dysphagia, odynophagia, heartburn, reflux, water brash, abdominal pain or cramps, nausea, vomiting, bloating, diarrhea, constipation, hematemesis, melena, hematochezia  or hemorrhoids. Genitourinary: Denies dysuria, frequency, urgency, nocturia, hesitancy, discharge, hematuria or flank pain. Musculoskeletal: Denies arthralgias, myalgias, stiffness, jt. swelling, pain, limping or strain/sprain.  Skin: Denies pruritus, rash, hives, warts, acne, eczema or change in skin lesion(s). Neuro: No weakness, tremor, incoordination, spasms, paresthesia or pain. Psychiatric: Denies confusion, memory loss or sensory loss. Endo: Denies change in weight, skin or hair change.  Heme/Lymph: No excessive bleeding, bruising or enlarged lymph nodes.  Physical Exam  BP (!) 152/98   Pulse 84   Temp (!) 97.2 F (36.2 C)   Resp 16   Ht 5' 10.5" (1.791 m)   Wt 257 lb (116.6 kg)   BMI 36.35 kg/m   Appears  well nourished, well groomed  and in no distress.  Eyes: PERRLA, EOMs,  conjunctiva no swelling or erythema. Sinuses: No frontal/maxillary tenderness ENT/Mouth: EAC's clear, TM's nl w/o erythema, bulging. Nares clear w/o erythema, swelling, exudates. Oropharynx clear without erythema or exudates. Oral hygiene is good. Tongue normal, non obstructing. Hearing intact.  Neck: Supple. Thyroid not palpable. Car 2+/2+ without bruits, nodes or JVD. Chest: Respirations nl with BS clear & equal w/o rales, rhonchi, wheezing or stridor.  Cor: Heart sounds normal w/ regular rate and rhythm without sig. murmurs, gallops, clicks or rubs. Peripheral pulses normal and equal  without edema.  Abdomen: Soft & bowel sounds normal. Non-tender w/o guarding, rebound, hernias, masses or organomegaly.  Lymphatics: Unremarkable.  Musculoskeletal: Full ROM all peripheral extremities, joint stability, 5/5 strength and normal gait.  Skin: Warm, dry without exposed rashes, lesions or ecchymosis apparent.  Neuro: Cranial nerves intact, reflexes equal bilaterally. Sensory-motor testing grossly intact. Tendon reflexes grossly intact.  Pysch: Alert & oriented x 3.  Insight and judgement nl & appropriate. No ideations.  Assessment and Plan:  1. Essential hypertension  - Continue medication, monitor blood pressure at home.  - Continue DASH diet.  Reminder to go to the  ER if any CP,  SOB, nausea, dizziness, severe HA, changes vision/speech.  - CBC with Differential/Platelet - COMPLETE METABOLIC PANEL WITH GFR - Magnesium - TSH  2. Hyperlipidemia, mixed  - Continue diet/meds, exercise,& lifestyle modifications.  - Continue monitor periodic cholesterol/liver & renal functions   - Lipid panel - TSH  3. Prediabetes  - Hemoglobin A1c - Insulin, random  4. Vitamin D deficiency  - Continue diet, exercise,  - lifestyle modifications.  - Monitor appropriate labs. - Continue supplementation.   - VITAMIN D 25 Hydroxyl  5. Lupus vasculitis ([name])  - Sedimentation rate  6. Chronic  gout  - Uric acid  7. Medication management  - CBC with Differential/Platelet - COMPLETE METABOLIC PANEL WITH GFR - Magnesium - Lipid panel - TSH - Hemoglobin A1c - Insulin, random - VITAMIN D 25 Hydroxyl - Sedimentation rate - C-reactive protein - Uric acid      Discussed  regular exercise, BP monitoring, weight control to achieve/maintain BMI less than 25 and discussed med and SE's. Recommended labs to assess and monitor clinical status with further disposition pending results of labs. Over 30 minutes of exam, counseling, chart review was performed.

## 2018-04-15 NOTE — Patient Instructions (Signed)

## 2018-04-16 ENCOUNTER — Ambulatory Visit: Payer: BLUE CROSS/BLUE SHIELD | Admitting: Internal Medicine

## 2018-04-16 VITALS — BP 152/98 | HR 84 | Temp 97.2°F | Resp 16 | Ht 70.5 in | Wt 257.0 lb

## 2018-04-16 DIAGNOSIS — R7303 Prediabetes: Secondary | ICD-10-CM | POA: Diagnosis not present

## 2018-04-16 DIAGNOSIS — M329 Systemic lupus erythematosus, unspecified: Secondary | ICD-10-CM | POA: Diagnosis not present

## 2018-04-16 DIAGNOSIS — Z79899 Other long term (current) drug therapy: Secondary | ICD-10-CM | POA: Diagnosis not present

## 2018-04-16 DIAGNOSIS — I10 Essential (primary) hypertension: Secondary | ICD-10-CM

## 2018-04-16 DIAGNOSIS — M1A9XX0 Chronic gout, unspecified, without tophus (tophi): Secondary | ICD-10-CM | POA: Diagnosis not present

## 2018-04-16 DIAGNOSIS — E782 Mixed hyperlipidemia: Secondary | ICD-10-CM

## 2018-04-16 DIAGNOSIS — E559 Vitamin D deficiency, unspecified: Secondary | ICD-10-CM | POA: Diagnosis not present

## 2018-04-16 DIAGNOSIS — M3219 Other organ or system involvement in systemic lupus erythematosus: Secondary | ICD-10-CM

## 2018-04-16 DIAGNOSIS — I7789 Other specified disorders of arteries and arterioles: Secondary | ICD-10-CM

## 2018-04-16 MED ORDER — CELECOXIB 200 MG PO CAPS: ORAL_CAPSULE | ORAL | 1 refills | Status: DC

## 2018-04-17 ENCOUNTER — Other Ambulatory Visit: Payer: Self-pay | Admitting: Internal Medicine

## 2018-04-17 DIAGNOSIS — M329 Systemic lupus erythematosus, unspecified: Secondary | ICD-10-CM

## 2018-04-17 DIAGNOSIS — M3219 Other organ or system involvement in systemic lupus erythematosus: Secondary | ICD-10-CM

## 2018-04-17 DIAGNOSIS — M2550 Pain in unspecified joint: Secondary | ICD-10-CM

## 2018-04-17 DIAGNOSIS — I7789 Other specified disorders of arteries and arterioles: Secondary | ICD-10-CM

## 2018-04-17 DIAGNOSIS — E782 Mixed hyperlipidemia: Secondary | ICD-10-CM

## 2018-04-17 LAB — COMPLETE METABOLIC PANEL WITH GFR
AG Ratio: 1.4 (calc) (ref 1.0–2.5)
ALBUMIN MSPROF: 4.2 g/dL (ref 3.6–5.1)
ALT: 38 U/L (ref 9–46)
AST: 36 U/L — ABNORMAL HIGH (ref 10–35)
Alkaline phosphatase (APISO): 61 U/L (ref 40–115)
BUN: 18 mg/dL (ref 7–25)
CO2: 30 mmol/L (ref 20–32)
CREATININE: 1.1 mg/dL (ref 0.70–1.25)
Calcium: 9.7 mg/dL (ref 8.6–10.3)
Chloride: 101 mmol/L (ref 98–110)
GFR, Est African American: 84 mL/min/{1.73_m2} (ref >=60)
GFR, Est Non African American: 72 mL/min/{1.73_m2} (ref >=60)
GLUCOSE: 90 mg/dL (ref 65–99)
Globulin: 3 g/dL (calc) (ref 1.9–3.7)
Potassium: 4.1 mmol/L (ref 3.5–5.3)
Sodium: 139 mmol/L (ref 135–146)
TOTAL PROTEIN: 7.2 g/dL (ref 6.1–8.1)
Total Bilirubin: 0.5 mg/dL (ref 0.2–1.2)

## 2018-04-17 LAB — CBC WITH DIFFERENTIAL/PLATELET
BASOS PCT: 0.3 %
Basophils Absolute: 26 cells/uL (ref 0–200)
EOS ABS: 181 {cells}/uL (ref 15–500)
Eosinophils Relative: 2.1 %
HCT: 38.8 % (ref 38.5–50.0)
HEMOGLOBIN: 13.1 g/dL — AB (ref 13.2–17.1)
LYMPHS ABS: 998 {cells}/uL (ref 850–3900)
MCH: 31.6 pg (ref 27.0–33.0)
MCHC: 33.8 g/dL (ref 32.0–36.0)
MCV: 93.5 fL (ref 80.0–100.0)
MONOS PCT: 8.5 %
MPV: 10.5 fL (ref 7.5–12.5)
NEUTROS ABS: 6665 {cells}/uL (ref 1500–7800)
Neutrophils Relative %: 77.5 %
Platelets: 303 10*3/uL (ref 140–400)
RBC: 4.15 10*6/uL — ABNORMAL LOW (ref 4.20–5.80)
RDW: 13.1 % (ref 11.0–15.0)
Total Lymphocyte: 11.6 %
WBC mixed population: 731 cells/uL (ref 200–950)
WBC: 8.6 10*3/uL (ref 3.8–10.8)

## 2018-04-17 LAB — MAGNESIUM: Magnesium: 1.7 mg/dL (ref 1.5–2.5)

## 2018-04-17 LAB — URIC ACID: Uric Acid, Serum: 5.1 mg/dL (ref 4.0–8.0)

## 2018-04-17 LAB — LIPID PANEL
Cholesterol: 244 mg/dL — ABNORMAL HIGH (ref <200)
HDL: 36 mg/dL — ABNORMAL LOW (ref >40)
LDL Cholesterol (Calc): 174 mg/dL (calc) — ABNORMAL HIGH
Non-HDL Cholesterol (Calc): 208 mg/dL (calc) — ABNORMAL HIGH (ref <130)
TRIGLYCERIDES: 186 mg/dL — AB (ref <150)
Total CHOL/HDL Ratio: 6.8 (calc) — ABNORMAL HIGH (ref <5.0)

## 2018-04-17 LAB — C-REACTIVE PROTEIN: CRP: 15.4 mg/L — ABNORMAL HIGH (ref <8.0)

## 2018-04-17 LAB — VITAMIN D 25 HYDROXY (VIT D DEFICIENCY, FRACTURES): Vit D, 25-Hydroxy: 55 ng/mL (ref 30–100)

## 2018-04-17 LAB — HEMOGLOBIN A1C
HEMOGLOBIN A1C: 6.2 %{Hb} — AB (ref <5.7)
Mean Plasma Glucose: 131 (calc)
eAG (mmol/L): 7.3 (calc)

## 2018-04-17 LAB — TSH: TSH: 2.11 mIU/L (ref 0.40–4.50)

## 2018-04-17 LAB — INSULIN, RANDOM: INSULIN: 15.5 u[IU]/mL (ref 2.0–19.6)

## 2018-04-17 LAB — SEDIMENTATION RATE: Sed Rate: 62 mm/h — ABNORMAL HIGH (ref 0–20)

## 2018-04-17 MED ORDER — EZETIMIBE 10 MG PO TABS: ORAL_TABLET | ORAL | 1 refills | Status: DC

## 2018-04-18 ENCOUNTER — Other Ambulatory Visit: Payer: Self-pay | Admitting: Internal Medicine

## 2018-04-18 ENCOUNTER — Other Ambulatory Visit: Payer: BLUE CROSS/BLUE SHIELD

## 2018-04-18 DIAGNOSIS — M329 Systemic lupus erythematosus, unspecified: Secondary | ICD-10-CM | POA: Diagnosis not present

## 2018-04-18 DIAGNOSIS — M3219 Other organ or system involvement in systemic lupus erythematosus: Secondary | ICD-10-CM

## 2018-04-18 DIAGNOSIS — M2550 Pain in unspecified joint: Secondary | ICD-10-CM

## 2018-04-18 DIAGNOSIS — I7789 Other specified disorders of arteries and arterioles: Secondary | ICD-10-CM

## 2018-04-18 MED ORDER — AZITHROMYCIN 250 MG PO TABS: ORAL_TABLET | ORAL | 1 refills | Status: DC

## 2018-04-19 LAB — B. BURGDORFI ANTIBODIES: B burgdorferi Ab IgG+IgM: <0.9 index

## 2018-04-19 LAB — ANCA SCREEN W REFLEX TITER

## 2018-04-19 LAB — C3 AND C4
C3 COMPLEMENT: 157 mg/dL (ref 82–185)
C4 Complement: 32 mg/dL (ref 15–53)

## 2018-04-19 LAB — ANTI-SMITH ANTIBODY: ENA SM Ab Ser-aCnc: <1 AI

## 2018-04-19 LAB — CYCLIC CITRUL PEPTIDE ANTIBODY, IGG: Cyclic Citrullin Peptide Ab: <16 UNITS

## 2018-04-19 LAB — ALDOLASE: Aldolase: 5.7 U/L (ref <=8.1)

## 2018-04-20 LAB — ANTI-NUCLEAR AB-TITER (ANA TITER): ANA Titer 1: 1:40 {titer} — ABNORMAL HIGH

## 2018-04-20 LAB — ANA: Anti Nuclear Antibody(ANA): POSITIVE — AB

## 2018-04-20 LAB — RHEUMATOID FACTOR: Rheumatoid fact SerPl-aCnc: <14 IU/mL (ref <14)

## 2018-04-20 LAB — ANTI-DNA ANTIBODY, DOUBLE-STRANDED: ds DNA Ab: <1 IU/mL

## 2018-04-20 LAB — CK: Total CK: 72 U/L (ref 44–196)

## 2018-04-24 ENCOUNTER — Other Ambulatory Visit: Payer: Self-pay | Admitting: Adult Health

## 2018-06-05 ENCOUNTER — Other Ambulatory Visit: Payer: Self-pay | Admitting: Internal Medicine

## 2018-06-05 DIAGNOSIS — G63 Polyneuropathy in diseases classified elsewhere: Secondary | ICD-10-CM

## 2018-06-26 DIAGNOSIS — H3561 Retinal hemorrhage, right eye: Secondary | ICD-10-CM | POA: Diagnosis not present

## 2018-06-27 DIAGNOSIS — H3561 Retinal hemorrhage, right eye: Secondary | ICD-10-CM | POA: Diagnosis not present

## 2018-06-27 DIAGNOSIS — H2513 Age-related nuclear cataract, bilateral: Secondary | ICD-10-CM | POA: Diagnosis not present

## 2018-06-27 DIAGNOSIS — H43813 Vitreous degeneration, bilateral: Secondary | ICD-10-CM | POA: Diagnosis not present

## 2018-07-06 ENCOUNTER — Other Ambulatory Visit: Payer: Self-pay | Admitting: Internal Medicine

## 2018-08-01 ENCOUNTER — Encounter: Payer: Self-pay | Admitting: *Deleted

## 2018-08-04 ENCOUNTER — Other Ambulatory Visit: Payer: Self-pay | Admitting: Internal Medicine

## 2018-08-07 ENCOUNTER — Encounter: Payer: Self-pay | Admitting: Internal Medicine

## 2018-08-26 ENCOUNTER — Encounter: Payer: Self-pay | Admitting: Internal Medicine

## 2018-08-26 NOTE — Patient Instructions (Addendum)
Coronavirus (COVID-19) Are you at risk?  Are you at risk for the Coronavirus (COVID-19)?  To be considered HIGH RISK for Coronavirus (COVID-19), you have to meet the following criteria:  . Traveled to [place], [place], [place], [place] or [place]; or in the [place] to [place], [place], [place]  . or [place]; and have fever, cough, and shortness of breath within the last 2 weeks of travel OR . Been in close contact with a person diagnosed with COVID-19 within the last 2 weeks and have  . fever, cough,and shortness of breath .  . IF YOU DO NOT MEET THESE CRITERIA, YOU ARE CONSIDERED LOW RISK FOR COVID-19.  What to do if you are HIGH RISK for COVID-19?  . If you are having a medical emergency, call 911. . Seek medical care right away. Before you go to a doctor's office, urgent care or emergency department, .  call ahead and tell them about your recent travel, contact with someone diagnosed with COVID-19  .  and your symptoms.  . You should receive instructions from your physician's office regarding next steps of care.  . When you arrive at healthcare provider, tell the healthcare staff immediately you have returned from  . visiting [place], [place], [place], [place] or [place]; or traveled in the [place] to [place], [place],  . [place] or [place] in the last two weeks or you have been in close contact with a person diagnosed with  . COVID-19 in the last 2 weeks.   . Tell the health care staff about your symptoms: fever, cough and shortness of breath. . After you have been seen by a medical provider, you will be either: o Tested for (COVID-19) and discharged home on quarantine except to seek medical care if  o symptoms worsen, and asked to  - Stay home and avoid contact with others until you get your results (4-5 days)  - Avoid travel on public transportation if possible (such as bus, train, or airplane) or o Sent to the Emergency Department by EMS for evaluation,  COVID-19 testing  and  o possible admission depending on your condition and test results.  What to do if you are LOW RISK for COVID-19?  Reduce your risk of any infection by using the same precautions used for avoiding the common cold or flu:  . Wash your hands often with soap and warm water for at least 20 seconds.  If soap and water are not readily available,  . use an alcohol-based hand sanitizer with at least 60% alcohol.  . If coughing or sneezing, cover your mouth and nose by coughing or sneezing into the elbow areas of your shirt or coat, .  into a tissue or into your sleeve (not your hands). . Avoid shaking hands with others and consider head nods or verbal greetings only. . Avoid touching your eyes, nose, or mouth with unwashed hands.  . Avoid close contact with people who are sick. . Avoid places or events with large numbers of people in one location, like concerts or sporting events. . Carefully consider travel plans you have or are making. . If you are planning any travel outside or inside the [place], visit the CDC's Travelers' Health webpage for the latest health notices. . If you have some symptoms but not all symptoms, continue to monitor at home and seek medical attention  . if your symptoms worsen. . If you are having a medical emergency, call 911. >>>>>>>>>>>>>>>>>>>>>>>>>>>>   Preventive Care for Adults  A healthy lifestyle and preventive care can promote health and wellness. Preventive health guidelines for men include the following key practices:  A routine yearly physical is a good way to check with your health care provider about your health and preventative screening. It is a chance to share any concerns and updates on your health and to receive a thorough exam.  Visit your dentist for a routine exam and preventative care every 6 months. Brush your teeth twice a day and floss once a day. Good oral hygiene prevents tooth decay and gum disease.  The frequency of eye exams  is based on your age, health, family medical history, use of contact lenses, and other factors. Follow your health care provider's recommendations for frequency of eye exams.  Eat a healthy diet. Foods such as vegetables, fruits, whole grains, low-fat dairy products, and lean protein foods contain the nutrients you need without too many calories. Decrease your intake of foods high in solid fats, added sugars, and salt. Eat the right amount of calories for you. Get information about a proper diet from your health care provider, if necessary.  Regular physical exercise is one of the most important things you can do for your health. Most adults should get at least 150 minutes of moderate-intensity exercise (any activity that increases your heart rate and causes you to sweat) each week. In addition, most adults need muscle-strengthening exercises on 2 or more days a week.  Maintain a healthy weight. The body mass index (BMI) is a screening tool to identify possible weight problems. It provides an estimate of body fat based on height and weight. Your health care provider can find your BMI and can help you achieve or maintain a healthy weight. For adults 20 years and older:  A BMI below 18.5 is considered underweight.  A BMI of 18.5 to 24.9 is normal.  A BMI of 25 to 29.9 is considered overweight.  A BMI of 30 and above is considered obese.  Maintain normal blood lipids and cholesterol levels by exercising and minimizing your intake of saturated fat. Eat a balanced diet with plenty of fruit and vegetables. Blood tests for lipids and cholesterol should begin at age 20 and be repeated every 5 years. If your lipid or cholesterol levels are high, you are over 50, or you are at high risk for heart disease, you may need your cholesterol levels checked more frequently. Ongoing high lipid and cholesterol levels should be treated with medicines if diet and exercise are not working.  If you smoke, find out from  your health care provider how to quit. If you do not use tobacco, do not start.  Lung cancer screening is recommended for adults aged 55-80 years who are at high risk for developing lung cancer because of a history of smoking. A yearly low-dose CT scan of the lungs is recommended for people who have at least a 30-pack-year history of smoking and are a current smoker or have quit within the past 15 years. A pack year of smoking is smoking an average of 1 pack of cigarettes a day for 1 year (for example: 1 pack a day for 30 years or 2 packs a day for 15 years). Yearly screening should continue until the smoker has stopped smoking for at least 15 years. Yearly screening should be stopped for people who develop a health problem that would prevent them from having lung cancer treatment.  If you choose to drink alcohol,   do not have more than 2 drinks per day. One drink is considered to be 12 ounces (355 mL) of beer, 5 ounces (148 mL) of wine, or 1.5 ounces (44 mL) of liquor.  Avoid use of street drugs. Do not share needles with anyone. Ask for help if you need support or instructions about stopping the use of drugs.  High blood pressure causes heart disease and increases the risk of stroke. Your blood pressure should be checked at least every 1-2 years. Ongoing high blood pressure should be treated with medicines, if weight loss and exercise are not effective.  If you are 45-79 years old, ask your health care provider if you should take aspirin to prevent heart disease.  Diabetes screening involves taking a blood sample to check your fasting blood sugar level. This should be done once every 3 years, after age 45, if you are within normal weight and without risk factors for diabetes. Testing should be considered at a younger age or be carried out more frequently if you are overweight and have at least 1 risk factor for diabetes.  Colorectal cancer can be detected and often prevented. Most routine colorectal  cancer screening begins at the age of 50 and continues through age 75. However, your health care provider may recommend screening at an earlier age if you have risk factors for colon cancer. On a yearly basis, your health care provider may provide home test kits to check for hidden blood in the stool. Use of a small camera at the end of a tube to directly examine the colon (sigmoidoscopy or colonoscopy) can detect the earliest forms of colorectal cancer. Talk to your health care provider about this at age 50, when routine screening begins. Direct exam of the colon should be repeated every 5-10 years through age 75, unless early forms of precancerous polyps or small growths are found.   Talk with your health care provider about prostate cancer screening.  Testicular cancer screening isrecommended for adult males. Screening includes self-exam, a health care provider exam, and other screening tests. Consult with your health care provider about any symptoms you have or any concerns you have about testicular cancer.  Use sunscreen. Apply sunscreen liberally and repeatedly throughout the day. You should seek shade when your shadow is shorter than you. Protect yourself by wearing long sleeves, pants, a wide-brimmed hat, and sunglasses year round, whenever you are outdoors.  Once a month, do a whole-body skin exam, using a mirror to look at the skin on your back. Tell your health care provider about new moles, moles that have irregular borders, moles that are larger than a pencil eraser, or moles that have changed in shape or color.  Stay current with required vaccines (immunizations).  Influenza vaccine. All adults should be immunized every year.  Tetanus, diphtheria, and acellular pertussis (Td, Tdap) vaccine. An adult who has not previously received Tdap or who does not know his vaccine status should receive 1 dose of Tdap. This initial dose should be followed by tetanus and diphtheria toxoids (Td) booster  doses every 10 years. Adults with an unknown or incomplete history of completing a 3-dose immunization series with Td-containing vaccines should begin or complete a primary immunization series including a Tdap dose. Adults should receive a Td booster every 10 years.  Varicella vaccine. An adult without evidence of immunity to varicella should receive 2 doses or a second dose if he has previously received 1 dose.  Human papillomavirus (HPV) vaccine. Males aged 13-21   years who have not received the vaccine previously should receive the 3-dose series. Males aged 22-26 years may be immunized. Immunization is recommended through the age of 26 years for any male who has sex with males and did not get any or all doses earlier. Immunization is recommended for any person with an immunocompromised condition through the age of 26 years if he did not get any or all doses earlier. During the 3-dose series, the second dose should be obtained 4-8 weeks after the first dose. The third dose should be obtained 24 weeks after the first dose and 16 weeks after the second dose.  Zoster vaccine. One dose is recommended for adults aged 60 years or older unless certain conditions are present.    PREVNAR  - Pneumococcal 13-valent conjugate (PCV13) vaccine. When indicated, a person who is uncertain of his immunization history and has no record of immunization should receive the PCV13 vaccine. An adult aged 19 years or older who has certain medical conditions and has not been previously immunized should receive 1 dose of PCV13 vaccine. This PCV13 should be followed with a dose of pneumococcal polysaccharide (PPSV23) vaccine. The PPSV23 vaccine dose should be obtained at least 1 r more year(s) after the dose of PCV13 vaccine. An adult aged 19 years or older who has certain medical conditions and previously received 1 or more doses of PPSV23 vaccine should receive 1 dose of PCV13. The PCV13 vaccine dose should be obtained 1 or more  years after the last PPSV23 vaccine dose.    PNEUMOVAX - Pneumococcal polysaccharide (PPSV23) vaccine. When PCV13 is also indicated, PCV13 should be obtained first. All adults aged 65 years and older should be immunized. An adult younger than age 65 years who has certain medical conditions should be immunized. Any person who resides in a nursing home or long-term care facility should be immunized. An adult smoker should be immunized. People with an immunocompromised condition and certain other conditions should receive both PCV13 and PPSV23 vaccines. People with human immunodeficiency virus (HIV) infection should be immunized as soon as possible after diagnosis. Immunization during chemotherapy or radiation therapy should be avoided. Routine use of PPSV23 vaccine is not recommended for American Indians, [place] Natives, or people younger than 65 years unless there are medical conditions that require PPSV23 vaccine. When indicated, people who have unknown immunization and have no record of immunization should receive PPSV23 vaccine. One-time revaccination 5 years after the first dose of PPSV23 is recommended for people aged 19-64 years who have chronic kidney failure, nephrotic syndrome, asplenia, or immunocompromised conditions. People who received 1-2 doses of PPSV23 before age 65 years should receive another dose of PPSV23 vaccine at age 65 years or later if at least 5 years have passed since the previous dose. Doses of PPSV23 are not needed for people immunized with PPSV23 at or after age 65 years.    Hepatitis A vaccine. Adults who wish to be protected from this disease, have certain high-risk conditions, work with hepatitis A-infected animals, work in hepatitis A research labs, or travel to or work in countries with a high rate of hepatitis A should be immunized. Adults who were previously unvaccinated and who anticipate close contact with an international adoptee during the first 60 days after arrival  in the [place] from a country with a high rate of hepatitis A should be immunized.    Hepatitis B vaccine. Adults should be immunized if they wish to be protected from this disease, have certain   high-risk conditions, may be exposed to blood or other infectious body fluids, are household contacts or sex partners of hepatitis B positive people, are clients or workers in certain care facilities, or travel to or work in countries with a high rate of hepatitis B.   Preventive Service / Frequency   Ages 40 to 64  Blood pressure check.  Lipid and cholesterol check  Lung cancer screening. / Every year if you are aged 55-80 years and have a 30-pack-year history of smoking and currently smoke or have quit within the past 15 years. Yearly screening is stopped once you have quit smoking for at least 15 years or develop a health problem that would prevent you from having lung cancer treatment.  Fecal occult blood test (FOBT) of stool. / Every year beginning at age 50 and continuing until age 75. You may not have to do this test if you get a colonoscopy every 10 years.  Flexible sigmoidoscopy** or colonoscopy.** / Every 5 years for a flexible sigmoidoscopy or every 10 years for a colonoscopy beginning at age 50 and continuing until age 75. Screening for abdominal aortic aneurysm (AAA)  by ultrasound is recommended for people who have history of high blood pressure or who are current or former smokers. +++++++++++ Recommend Adult Low Dose Aspirin or  coated  Aspirin 81 mg daily  To reduce risk of Colon Cancer 20 %,  Skin Cancer 26 % ,  Malignant Melanoma 46%  and  Pancreatic cancer 60% ++++++++++++++++++++ Vitamin D goal  is between 70-100.  Please make sure that you are taking your Vitamin D as directed.  It is very important as a natural anti-inflammatory  helping hair, skin, and nails, as well as reducing stroke and heart attack risk.  It helps your bones and helps with mood. It also  decreases numerous cancer risks so please take it as directed.  Low Vit D is associated with a 200-300% higher risk for CANCER  and 200-300% higher risk for HEART   ATTACK  &  STROKE.   ...................................... It is also associated with higher death rate at younger ages,  autoimmune diseases like Rheumatoid arthritis, Lupus, Multiple Sclerosis.    Also many other serious conditions, like depression, Alzheimer's Dementia, infertility, muscle aches, fatigue, fibromyalgia - just to name a few. +++++++++++++++++++++ Recommend the book "The END of DIETING" by Dr Joel Fuhrman  & the book "The END of DIABETES " by Dr Joel Fuhrman At [employer] - get book & Audio CD's    Being diabetic has a  300% increased risk for heart attack, stroke, cancer, and alzheimer- type vascular dementia. It is very important that you work harder with diet by avoiding all foods that are white. Avoid white rice (brown & wild rice is OK), white potatoes (sweetpotatoes in moderation is OK), White bread or wheat bread or anything made out of white flour like bagels, donuts, rolls, buns, biscuits, cakes, pastries, cookies, pizza crust, and pasta (made from white flour & egg whites) - vegetarian pasta or spinach or wheat pasta is OK. Multigrain breads like Arnold's or [employer], or multigrain sandwich thins or flatbreads.  Diet, exercise and weight loss can reverse and cure diabetes in the early stages.  Diet, exercise and weight loss is very important in the control and prevention of complications of diabetes which affects every system in your body, ie. Brain - dementia/stroke, eyes - glaucoma/blindness, heart - heart attack/heart failure, kidneys - dialysis, stomach - gastric paralysis, intestines - malabsorption,   nerves - severe painful neuritis, circulation - gangrene & loss of a leg(s), and finally cancer and Alzheimers.    I recommend avoid fried & greasy foods,  sweets/candy, white rice (brown or wild rice or  Quinoa is OK), white potatoes (sweet potatoes are OK) - anything made from white flour - bagels, doughnuts, rolls, buns, biscuits,white and wheat breads, pizza crust and traditional pasta made of white flour & egg white(vegetarian pasta or spinach or wheat pasta is OK).  Multi-grain bread is OK - like multi-grain flat bread or sandwich thins. Avoid alcohol in excess. Exercise is also important.    Eat all the vegetables you want - avoid meat, especially red meat and dairy - especially cheese.  Cheese is the most concentrated form of trans-fats which is the worst thing to clog up our arteries. Veggie cheese is OK which can be found in the fresh produce section at [employer] or [employer] or [employer]  ++++++++++++++++++++++ DASH Eating Plan  DASH stands for "Dietary Approaches to Stop Hypertension."   The DASH eating plan is a healthy eating plan that has been shown to reduce high blood pressure (hypertension). Additional health benefits may include reducing the risk of type 2 diabetes mellitus, heart disease, and stroke. The DASH eating plan may also help with weight loss. WHAT DO I NEED TO KNOW ABOUT THE DASH EATING PLAN? For the DASH eating plan, you will follow these general guidelines:  Choose foods with a percent daily value for sodium of less than 5% (as listed on the food label).  Use salt-free seasonings or herbs instead of table salt or sea salt.  Check with your health care provider or pharmacist before using salt substitutes.  Eat lower-sodium products, often labeled as "lower sodium" or "no salt added."  Eat fresh foods.  Eat more vegetables, fruits, and low-fat dairy products.  Choose whole grains. Look for the word "whole" as the first word in the ingredient list.  Choose fish   Limit sweets, desserts, sugars, and sugary drinks.  Choose heart-healthy fats.  Eat veggie cheese   Eat more home-cooked food and less restaurant, buffet, and fast food.  Limit fried  foods.  Cook foods using methods other than frying.  Limit canned vegetables. If you do use them, rinse them well to decrease the sodium.  When eating at a restaurant, ask that your food be prepared with less salt, or no salt if possible.                      WHAT FOODS CAN I EAT? Read Dr Joel Fuhrman's books on The End of Dieting & The End of Diabetes  Grains Whole grain or whole wheat bread. Brown rice. Whole grain or whole wheat pasta. Quinoa, bulgur, and whole grain cereals. Low-sodium cereals. Corn or whole wheat flour tortillas. Whole grain cornbread. Whole grain crackers. Low-sodium crackers.  Vegetables Fresh or frozen vegetables (raw, steamed, roasted, or grilled). Low-sodium or reduced-sodium tomato and vegetable juices. Low-sodium or reduced-sodium tomato sauce and paste. Low-sodium or reduced-sodium canned vegetables.   Fruits All fresh, canned (in natural juice), or frozen fruits.  Protein Products  All fish and seafood.  Dried beans, peas, or lentils. Unsalted nuts and seeds. Unsalted canned beans.  Dairy Low-fat dairy products, such as skim or 1% milk, 2% or reduced-fat cheeses, low-fat ricotta or cottage cheese, or plain low-fat yogurt. Low-sodium or reduced-sodium cheeses.  Fats and Oils Tub margarines without trans fats. Light or reduced-fat mayonnaise   and salad dressings (reduced sodium). Avocado. Safflower, olive, or canola oils. Natural peanut or almond butter.  Other Unsalted popcorn and pretzels. The items listed above may not be a complete list of recommended foods or beverages. Contact your dietitian for more options.  +++++++++++++++++++  WHAT FOODS ARE NOT RECOMMENDED? Grains/ White flour or wheat flour White bread. White pasta. White rice. Refined cornbread. Bagels and croissants. Crackers that contain trans fat.  Vegetables  Creamed or fried vegetables. Vegetables in a . Regular canned vegetables. Regular canned tomato sauce and paste. Regular  tomato and vegetable juices.  Fruits Dried fruits. Canned fruit in light or heavy syrup. Fruit juice.  Meat and Other Protein Products Meat in general - RED meat & White meat.  Fatty cuts of meat. Ribs, chicken wings, all processed meats as bacon, sausage, bologna, salami, fatback, hot dogs, bratwurst and packaged luncheon meats.  Dairy Whole or 2% milk, cream, half-and-half, and cream cheese. Whole-fat or sweetened yogurt. Full-fat cheeses or blue cheese. Non-dairy creamers and whipped toppings. Processed cheese, cheese spreads, or cheese curds.  Condiments Onion and garlic salt, seasoned salt, table salt, and sea salt. Canned and packaged gravies. Worcestershire sauce. Tartar sauce. Barbecue sauce. Teriyaki sauce. Soy sauce, including reduced sodium. Steak sauce. Fish sauce. Oyster sauce. Cocktail sauce. Horseradish. Ketchup and mustard. Meat flavorings and tenderizers. Bouillon cubes. Hot sauce. Tabasco sauce. Marinades. Taco seasonings. Relishes.  Fats and Oils Butter, stick margarine, lard, shortening and bacon fat. Coconut, palm kernel, or palm oils. Regular salad dressings.  Pickles and olives. Salted popcorn and pretzels.  The items listed above may not be a complete list of foods and beverages to avoid.   Coronavirus (COVID-19) Are you at risk?  Are you at risk for the Coronavirus (COVID-19)?  To be considered HIGH RISK for Coronavirus (COVID-19), you have to meet the following criteria:  . Traveled to [place], [place], [place], [place] or [place]; or in the [place] to [place], [place], [place]  . or [place]; and have fever, cough, and shortness of breath within the last 2 weeks of travel OR . Been in close contact with a person diagnosed with COVID-19 within the last 2 weeks and have  . fever, cough,and shortness of breath .  . IF YOU DO NOT MEET THESE CRITERIA, YOU ARE CONSIDERED LOW RISK FOR COVID-19.  What to do if you are HIGH RISK for COVID-19?  Marland Kitchen If  you are having a medical emergency, call 911. . Seek medical care right away. Before you go to a doctor's office, urgent care or emergency department, .  call ahead and tell them about your recent travel, contact with someone diagnosed with COVID-19  .  and your symptoms.  . You should receive instructions from your physician's office regarding next steps of care.  . When you arrive at healthcare provider, tell the healthcare staff immediately you have returned from  . visiting [place], [place], [place], [place] or [place]; or traveled in the [place] to [place], [place],  . [place] or [place] in the last two weeks or you have been in close contact with a person diagnosed with  . COVID-19 in the last 2 weeks.   . Tell the health care staff about your symptoms: fever, cough and shortness of breath. . After you have been seen by a medical provider, you will be either: o Tested for (COVID-19) and discharged home on quarantine except to seek medical care if  o symptoms  worsen, and asked to  - Stay home and avoid contact with others until you get your results (4-5 days)  - Avoid travel on public transportation if possible (such as bus, train, or airplane) or o Sent to the Emergency Department by EMS for evaluation, COVID-19 testing  and  o possible admission depending on your condition and test results.  What to do if you are LOW RISK for COVID-19?  Reduce your risk of any infection by using the same precautions used for avoiding the common cold or flu:  Marland Kitchen Wash your hands often with soap and warm water for at least 20 seconds.  If soap and water are not readily available,  . use an alcohol-based hand sanitizer with at least 60% alcohol.  . If coughing or sneezing, cover your mouth and nose by coughing or sneezing into the elbow areas of your shirt or coat, .  into a tissue or into your sleeve (not your hands). . Avoid shaking hands with others and consider head nods or verbal greetings  only. . Avoid touching your eyes, nose, or mouth with unwashed hands.  . Avoid close contact with people who are sick. . Avoid places or events with large numbers of people in one location, like concerts or sporting events. . Carefully consider travel plans you have or are making. . If you are planning any travel outside or inside the [place], visit the CDC's Travelers' Health webpage for the latest health notices. . If you have some symptoms but not all symptoms, continue to monitor at home and seek medical attention  . if your symptoms worsen. . If you are having a medical emergency, call 911. >>>>>>>>>>>>>>>>>>>>>>>>>>>> Preventive Care for Adults  A healthy lifestyle and preventive care can promote health and wellness. Preventive health guidelines for men include the following key practices:  A routine yearly physical is a good way to check with your health care provider about your health and preventative screening. It is a chance to share any concerns and updates on your health and to receive a thorough exam.  Visit your dentist for a routine exam and preventative care every 6 months. Brush your teeth twice a day and floss once a day. Good oral hygiene prevents tooth decay and gum disease.  The frequency of eye exams is based on your age, health, family medical history, use of contact lenses, and other factors. Follow your health care provider's recommendations for frequency of eye exams.  Eat a healthy diet. Foods such as vegetables, fruits, whole grains, low-fat dairy products, and lean protein foods contain the nutrients you need without too many calories. Decrease your intake of foods high in solid fats, added sugars, and salt. Eat the right amount of calories for you. Get information about a proper diet from your health care provider, if necessary.  Regular physical exercise is one of the most important things you can do for your health. Most adults should get at least 150 minutes of  moderate-intensity exercise (any activity that increases your heart rate and causes you to sweat) each week. In addition, most adults need muscle-strengthening exercises on 2 or more days a week.  Maintain a healthy weight. The body mass index (BMI) is a screening tool to identify possible weight problems. It provides an estimate of body fat based on height and weight. Your health care provider can find your BMI and can help you achieve or maintain a healthy weight. For adults 20 years and older:  A BMI below  18.5 is considered underweight.  A BMI of 18.5 to 24.9 is normal.  A BMI of 25 to 29.9 is considered overweight.  A BMI of 30 and above is considered obese.  Maintain normal blood lipids and cholesterol levels by exercising and minimizing your intake of saturated fat. Eat a balanced diet with plenty of fruit and vegetables. Blood tests for lipids and cholesterol should begin at age 25 and be repeated every 5 years. If your lipid or cholesterol levels are high, you are over 50, or you are at high risk for heart disease, you may need your cholesterol levels checked more frequently. Ongoing high lipid and cholesterol levels should be treated with medicines if diet and exercise are not working.  If you smoke, find out from your health care provider how to quit. If you do not use tobacco, do not start.  Lung cancer screening is recommended for adults aged 64-80 years who are at high risk for developing lung cancer because of a history of smoking. A yearly low-dose CT scan of the lungs is recommended for people who have at least a 30-pack-year history of smoking and are a current smoker or have quit within the past 15 years. A pack year of smoking is smoking an average of 1 pack of cigarettes a day for 1 year (for example: 1 pack a day for 30 years or 2 packs a day for 15 years). Yearly screening should continue until the smoker has stopped smoking for at least 15 years. Yearly screening should be  stopped for people who develop a health problem that would prevent them from having lung cancer treatment.  If you choose to drink alcohol, do not have more than 2 drinks per day. One drink is considered to be 12 ounces (355 mL) of beer, 5 ounces (148 mL) of wine, or 1.5 ounces (44 mL) of liquor.  Avoid use of street drugs. Do not share needles with anyone. Ask for help if you need support or instructions about stopping the use of drugs.  High blood pressure causes heart disease and increases the risk of stroke. Your blood pressure should be checked at least every 1-2 years. Ongoing high blood pressure should be treated with medicines, if weight loss and exercise are not effective.  If you are 101-29 years old, ask your health care provider if you should take aspirin to prevent heart disease.  Diabetes screening involves taking a blood sample to check your fasting blood sugar level. This should be done once every 3 years, after age 27, if you are within normal weight and without risk factors for diabetes. Testing should be considered at a younger age or be carried out more frequently if you are overweight and have at least 1 risk factor for diabetes.  Colorectal cancer can be detected and often prevented. Most routine colorectal cancer screening begins at the age of 79 and continues through age 66. However, your health care provider may recommend screening at an earlier age if you have risk factors for colon cancer. On a yearly basis, your health care provider may provide home test kits to check for hidden blood in the stool. Use of a small camera at the end of a tube to directly examine the colon (sigmoidoscopy or colonoscopy) can detect the earliest forms of colorectal cancer. Talk to your health care provider about this at age 39, when routine screening begins. Direct exam of the colon should be repeated every 5-10 years through age 34, unless early  forms of precancerous polyps or small growths are  found.   Talk with your health care provider about prostate cancer screening.  Testicular cancer screening isrecommended for adult males. Screening includes self-exam, a health care provider exam, and other screening tests. Consult with your health care provider about any symptoms you have or any concerns you have about testicular cancer.  Use sunscreen. Apply sunscreen liberally and repeatedly throughout the day. You should seek shade when your shadow is shorter than you. Protect yourself by wearing long sleeves, pants, a wide-brimmed hat, and sunglasses year round, whenever you are outdoors.  Once a month, do a whole-body skin exam, using a mirror to look at the skin on your back. Tell your health care provider about new moles, moles that have irregular borders, moles that are larger than a pencil eraser, or moles that have changed in shape or color.  Stay current with required vaccines (immunizations).  Influenza vaccine. All adults should be immunized every year.  Tetanus, diphtheria, and acellular pertussis (Td, Tdap) vaccine. An adult who has not previously received Tdap or who does not know his vaccine status should receive 1 dose of Tdap. This initial dose should be followed by tetanus and diphtheria toxoids (Td) booster doses every 10 years. Adults with an unknown or incomplete history of completing a 3-dose immunization series with Td-containing vaccines should begin or complete a primary immunization series including a Tdap dose. Adults should receive a Td booster every 10 years.  Varicella vaccine. An adult without evidence of immunity to varicella should receive 2 doses or a second dose if he has previously received 1 dose.  Human papillomavirus (HPV) vaccine. Males aged 70-21 years who have not received the vaccine previously should receive the 3-dose series. Males aged 22-26 years may be immunized. Immunization is recommended through the age of 79 years for any male who has sex with  males and did not get any or all doses earlier. Immunization is recommended for any person with an immunocompromised condition through the age of 45 years if he did not get any or all doses earlier. During the 3-dose series, the second dose should be obtained 4-8 weeks after the first dose. The third dose should be obtained 24 weeks after the first dose and 16 weeks after the second dose.  Zoster vaccine. One dose is recommended for adults aged 17 years or older unless certain conditions are present.    PREVNAR  - Pneumococcal 13-valent conjugate (PCV13) vaccine. When indicated, a person who is uncertain of his immunization history and has no record of immunization should receive the PCV13 vaccine. An adult aged 77 years or older who has certain medical conditions and has not been previously immunized should receive 1 dose of PCV13 vaccine. This PCV13 should be followed with a dose of pneumococcal polysaccharide (PPSV23) vaccine. The PPSV23 vaccine dose should be obtained at least 1 r more year(s) after the dose of PCV13 vaccine. An adult aged 86 years or older who has certain medical conditions and previously received 1 or more doses of PPSV23 vaccine should receive 1 dose of PCV13. The PCV13 vaccine dose should be obtained 1 or more years after the last PPSV23 vaccine dose.    PNEUMOVAX - Pneumococcal polysaccharide (PPSV23) vaccine. When PCV13 is also indicated, PCV13 should be obtained first. All adults aged 47 years and older should be immunized. An adult younger than age 56 years who has certain medical conditions should be immunized. Any person who resides in a nursing  home or long-term care facility should be immunized. An adult smoker should be immunized. People with an immunocompromised condition and certain other conditions should receive both PCV13 and PPSV23 vaccines. People with human immunodeficiency virus (HIV) infection should be immunized as soon as possible after diagnosis. Immunization  during chemotherapy or radiation therapy should be avoided. Routine use of PPSV23 vaccine is not recommended for American Indians, [place] Natives, or people younger than 65 years unless there are medical conditions that require PPSV23 vaccine. When indicated, people who have unknown immunization and have no record of immunization should receive PPSV23 vaccine. One-time revaccination 5 years after the first dose of PPSV23 is recommended for people aged 19-64 years who have chronic kidney failure, nephrotic syndrome, asplenia, or immunocompromised conditions. People who received 1-2 doses of PPSV23 before age 57 years should receive another dose of PPSV23 vaccine at age 27 years or later if at least 5 years have passed since the previous dose. Doses of PPSV23 are not needed for people immunized with PPSV23 at or after age 21 years.    Hepatitis A vaccine. Adults who wish to be protected from this disease, have certain high-risk conditions, work with hepatitis A-infected animals, work in hepatitis A research labs, or travel to or work in countries with a high rate of hepatitis A should be immunized. Adults who were previously unvaccinated and who anticipate close contact with an international adoptee during the first 60 days after arrival in the [place] from a country with a high rate of hepatitis A should be immunized.    Hepatitis B vaccine. Adults should be immunized if they wish to be protected from this disease, have certain high-risk conditions, may be exposed to blood or other infectious body fluids, are household contacts or sex partners of hepatitis B positive people, are clients or workers in certain care facilities, or travel to or work in countries with a high rate of hepatitis B.   Preventive Service / Frequency   Ages 41 to 12  Blood pressure check.  Lipid and cholesterol check  Lung cancer screening. / Every year if you are aged 3-80 years and have a 30-pack-year history of  smoking and currently smoke or have quit within the past 15 years. Yearly screening is stopped once you have quit smoking for at least 15 years or develop a health problem that would prevent you from having lung cancer treatment.  Fecal occult blood test (FOBT) of stool. / Every year beginning at age 56 and continuing until age 47. You may not have to do this test if you get a colonoscopy every 10 years.  Flexible sigmoidoscopy** or colonoscopy.** / Every 5 years for a flexible sigmoidoscopy or every 10 years for a colonoscopy beginning at age 74 and continuing until age 72. Screening for abdominal aortic aneurysm (AAA)  by ultrasound is recommended for people who have history of high blood pressure or who are current or former smokers. +++++++++++ Recommend Adult Low Dose Aspirin or  coated  Aspirin 81 mg daily  To reduce risk of Colon Cancer 20 %,  Skin Cancer 26 % ,  Malignant Melanoma 46%  and  Pancreatic cancer 60% ++++++++++++++++++++ Vitamin D goal  is between 70-100.  Please make sure that you are taking your Vitamin D as directed.  It is very important as a natural anti-inflammatory  helping hair, skin, and nails, as well as reducing stroke and heart attack risk.  It helps your bones and helps with mood. It also  decreases numerous cancer risks so please take it as directed.  Low Vit D is associated with a 200-300% higher risk for CANCER  and 200-300% higher risk for HEART   ATTACK  &  STROKE.   .....................................Marland Kitchen It is also associated with higher death rate at younger ages,  autoimmune diseases like Rheumatoid arthritis, Lupus, Multiple Sclerosis.    Also many other serious conditions, like depression, Alzheimer's Dementia, infertility, muscle aches, fatigue, fibromyalgia - just to name a few. +++++++++++++++++++++ Recommend the book "The END of DIETING" by Dr Excell Seltzer  & the book "The END of DIABETES " by Dr Excell Seltzer At [email or web address] - get book &  Audio CD's    Being diabetic has a  300% increased risk for heart attack, stroke, cancer, and alzheimer- type vascular dementia. It is very important that you work harder with diet by avoiding all foods that are white. Avoid white rice (brown & wild rice is OK), white potatoes (sweetpotatoes in moderation is OK), White bread or wheat bread or anything made out of white flour like bagels, donuts, rolls, buns, biscuits, cakes, pastries, cookies, pizza crust, and pasta (made from white flour & egg whites) - vegetarian pasta or spinach or wheat pasta is OK. Multigrain breads like Arnold's or Pepper[employer], or multigrain sandwich thins or flatbreads.  Diet, exercise and weight loss can reverse and cure diabetes in the early stages.  Diet, exercise and weight loss is very important in the control and prevention of complications of diabetes which affects every system in your body, ie. Brain - dementia/stroke, eyes - glaucoma/blindness, heart - heart attack/heart failure, kidneys - dialysis, stomach - gastric paralysis, intestines - malabsorption, nerves - severe painful neuritis, circulation - gangrene & loss of a leg(s), and finally cancer and Alzheimers.    I recommend avoid fried & greasy foods,  sweets/candy, white rice (brown or wild rice or Quinoa is OK), white potatoes (sweet potatoes are OK) - anything made from white flour - bagels, doughnuts, rolls, buns, biscuits,white and wheat breads, pizza crust and traditional pasta made of white flour & egg white(vegetarian pasta or spinach or wheat pasta is OK).  Multi-grain bread is OK - like multi-grain flat bread or sandwich thins. Avoid alcohol in excess. Exercise is also important.    Eat all the vegetables you want - avoid meat, especially red meat and dairy - especially cheese.  Cheese is the most concentrated form of trans-fats which is the worst thing to clog up our arteries. Veggie cheese is OK which can be found in the fresh produce section at  [employer] or [employer] or [employer]  ++++++++++++++++++++++ DASH Eating Plan  DASH stands for "Dietary Approaches to Stop Hypertension."   The DASH eating plan is a healthy eating plan that has been shown to reduce high blood pressure (hypertension). Additional health benefits may include reducing the risk of type 2 diabetes mellitus, heart disease, and stroke. The DASH eating plan may also help with weight loss. WHAT DO I NEED TO KNOW ABOUT THE DASH EATING PLAN? For the DASH eating plan, you will follow these general guidelines:  Choose foods with a percent daily value for sodium of less than 5% (as listed on the food label).  Use salt-free seasonings or herbs instead of table salt or sea salt.  Check with your health care provider or pharmacist before using salt substitutes.  Eat lower-sodium products, often labeled as "lower sodium" or "no salt added."  Eat fresh foods.  Eat more  vegetables, fruits, and low-fat dairy products.  Choose whole grains. Look for the word "whole" as the first word in the ingredient list.  Choose fish   Limit sweets, desserts, sugars, and sugary drinks.  Choose heart-healthy fats.  Eat veggie cheese   Eat more home-cooked food and less restaurant, buffet, and fast food.  Limit fried foods.  Cook foods using methods other than frying.  Limit canned vegetables. If you do use them, rinse them well to decrease the sodium.  When eating at a restaurant, ask that your food be prepared with less salt, or no salt if possible.                      WHAT FOODS CAN I EAT? Read Dr Fara Olden Fuhrman's books on The End of Dieting & The End of Diabetes  Grains Whole grain or whole wheat bread. Brown rice. Whole grain or whole wheat pasta. Quinoa, bulgur, and whole grain cereals. Low-sodium cereals. Corn or whole wheat flour tortillas. Whole grain cornbread. Whole grain crackers. Low-sodium crackers.  Vegetables Fresh or frozen vegetables (raw, steamed,  roasted, or grilled). Low-sodium or reduced-sodium tomato and vegetable juices. Low-sodium or reduced-sodium tomato sauce and paste. Low-sodium or reduced-sodium canned vegetables.   Fruits All fresh, canned (in natural juice), or frozen fruits.  Protein Products  All fish and seafood.  Dried beans, peas, or lentils. Unsalted nuts and seeds. Unsalted canned beans.  Dairy Low-fat dairy products, such as skim or 1% milk, 2% or reduced-fat cheeses, low-fat ricotta or cottage cheese, or plain low-fat yogurt. Low-sodium or reduced-sodium cheeses.  Fats and Oils Tub margarines without trans fats. Light or reduced-fat mayonnaise and salad dressings (reduced sodium). Avocado. Safflower, olive, or canola oils. Natural peanut or almond butter.  Other Unsalted popcorn and pretzels. The items listed above may not be a complete list of recommended foods or beverages. Contact your dietitian for more options.  +++++++++++++++++++  WHAT FOODS ARE NOT RECOMMENDED? Grains/ White flour or wheat flour White bread. White pasta. White rice. Refined cornbread. Bagels and croissants. Crackers that contain trans fat.  Vegetables  Creamed or fried vegetables. Vegetables in a . Regular canned vegetables. Regular canned tomato sauce and paste. Regular tomato and vegetable juices.  Fruits Dried fruits. Canned fruit in light or heavy syrup. Fruit juice.  Meat and Other Protein Products Meat in general - RED meat & White meat.  Fatty cuts of meat. Ribs, chicken wings, all processed meats as bacon, sausage, bologna, salami, fatback, hot dogs, bratwurst and packaged luncheon meats.  Dairy Whole or 2% milk, cream, half-and-half, and cream cheese. Whole-fat or sweetened yogurt. Full-fat cheeses or blue cheese. Non-dairy creamers and whipped toppings. Processed cheese, cheese spreads, or cheese curds.  Condiments Onion and garlic salt, seasoned salt, table salt, and sea salt. Canned and packaged gravies.  Worcestershire sauce. Tartar sauce. Barbecue sauce. Teriyaki sauce. Soy sauce, including reduced sodium. Steak sauce. Fish sauce. Oyster sauce. Cocktail sauce. Horseradish. Ketchup and mustard. Meat flavorings and tenderizers. Bouillon cubes. Hot sauce. Tabasco sauce. Marinades. Taco seasonings. Relishes.  Fats and Oils Butter, stick margarine, lard, shortening and bacon fat. Coconut, palm kernel, or palm oils. Regular salad dressings.  Pickles and olives. Salted popcorn and pretzels.  The items listed above may not be a complete list of foods and beverages to avoid.

## 2018-08-26 NOTE — Progress Notes (Signed)
[facility]   [name], M.D.     [name], P.A.-C [name], Luther                [address] [place], [place] [address] Telephone [phone] Telefax [phone] Annual  Screening/Preventative Visit  & Comprehensive Evaluation & Examination  History of Present Illness:     This very nice 63 y.o. single WM presents for a Screening /Preventative Visit & comprehensive evaluation and management of multiple medical co-morbidities.  Patient has been followed for HTN, HLD, Gout, Prediabetes and Vitamin D Deficiency.  His Gout is apparently controlled on his Allopurinol. 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  In Dec 2019, he had extensive labs for Connective Tissue Disease which were essentially unremarkable. He is on low dose daily prednisone . He does c/o generalized "stiffness" in his muscles , but denies joint pains.  He is on Gabapentin for painful Peripheral Neuropathy.      HTN predates since 2004 altho treatment wasn't started until 2008. Patient's BP has been controlled at home.  Today's BP: 112/70. Patient denies any cardiac symptoms as chest pain, palpitations, shortness of breath, dizziness or ankle swelling.     Patient is statin intolerant and his  hyperlipidemia is not controlled with diet and Zetia. Patient denies myalgias or other medication SE's. Last lipids were not at goal: Lab Results  Component Value Date   CHOL 244 (H) 04/16/2018   HDL 36 (L) 04/16/2018   LDLCALC 174 (H) 04/16/2018   TRIG 186 (H) 04/16/2018   CHOLHDL 6.8 (H) 04/16/2018      Patient has Morbid Obesity (BMI 36.9+) and  hx/o prediabetes  (A1c 5.9% / 2011 & 6.0% / 2016) and patient denies reactive hypoglycemic symptoms, visual blurring, diabetic polys or paresthesias. Last A1c was not at goal: Lab Results  Component Value Date   HGBA1C 6.2 (H) 04/16/2018        Finally, patient has history of Vitamin D Deficiency("42" / 2008) and last vitamin D was near goal  (65-100): Lab Results  Component Value Date   VD25OH 55 04/16/2018   Current Outpatient Medications on File Prior to Visit  Medication Sig  . allopurinol (ZYLOPRIM) 300 MG tablet Take 300 mg by mouth daily.  Marland Kitchen ALPRAZolam (XANAX) 1 MG tablet TAKE 1/2-1 TAB 2-3 TIMES A DAY ONLY IF NEEDED FOR ANXIETY.TRY TO LIMIT TO 5DAYS/WEEK.  Marland Kitchen aspirin EC 81 MG tablet Take 81 mg by mouth.  Marland Kitchen atenolol (TENORMIN) 100 MG tablet Take 1 tablet (100 mg total) by mouth daily. for blood pressure  . celecoxib (CELEBREX) 200 MG capsule Take 1 capsule daily for Pain & Inflammation  . Cholecalciferol (VITAMIN D3) 5000 UNITS CAPS Take 1 capsule by mouth daily.  Marland Kitchen ezetimibe (ZETIA) 10 MG tablet Take 1 tablet daily for Cholesterol  . gabapentin (NEURONTIN) 100 MG capsule TAKE 1 CAPSULE UP TO 3 TIMES DAILY FOR NEUROPATHY PAIN.  . hydrOXYzine (ATARAX/VISTARIL) 25 MG tablet TAKE 2 TABLETS THREE TIMES DAILY AS NEEDED FOR ITCHING.  Marland Kitchen lisinopril-hydrochlorothiazide (PRINZIDE,ZESTORETIC) 20-25 MG tablet TAKE 1 TABLET ONCE DAILY.  . Magnesium 400 MG TABS Take 1 tablet by mouth daily.  Marland Kitchen MILK THISTLE PO Take 1 tablet by mouth daily.  . Multiple Vitamin (MULTIVITAMIN) capsule Take 1 capsule by mouth daily.  . Omega-3 Fatty  Acids (FISH OIL PO) Take 1 capsule by mouth daily.  Marland Kitchen OVER THE COUNTER MEDICATION Takes Alphalophoric Acid 1 capsule daily  . predniSONE (DELTASONE) 10 MG tablet TAKE 1 OR 2 TABLETS DAILY AS DIRECTED BY DR.  Marland Kitchen Probiotic Product (PROBIOTIC DAILY) CAPS Take 1 capsule by mouth daily.  Marland Kitchen triamcinolone cream (KENALOG) 0.1 % Apply 1 application topically 3 (three) times daily.  . TURMERIC PO Take 2,000 mg by mouth daily.   . [DISCONTINUED] fenofibrate micronized (LOFIBRA) 134 MG capsule Take 134 mg by mouth daily.   No current facility-administered medications on file prior to visit.    Allergies  Allergen  Reactions  . Imuran [Azathioprine] Rash  . Lipitor [Atorvastatin]     Myalgias   . Penicillins     Causes lupus flare ups  . Zocor [Simvastatin]     Myalgias   Past Medical History:  Diagnosis Date  . Asthma   . GERD (gastroesophageal reflux disease)   . Hypertension   . Lupus ([place])   . Prediabetes   . Seasonal allergies   . Vitamin D deficiency    Health Maintenance  Topic Date Due  . INFLUENZA VACCINE  12/08/2018  . COLONOSCOPY  07/31/2021  . TETANUS/TDAP  07/19/2022  . Hepatitis C Screening  Completed  . HIV Screening  Completed   Immunization History  Administered Date(s) Administered  . Influenza Split 02/26/2013, 02/09/2015  . PPD Test 07/22/2013, 07/23/2014, 07/24/2015, 08/14/2017, 08/27/2018  . Pneumococcal Polysaccharide-23 05/22/2008  . Tdap 07/18/2012   Last Colon - 08/01/2011 - Dr Sharlett Iles - recc 10 yr f/u due Apr 2023  Past Surgical History:  Procedure Laterality Date  . AXILLARY LYMPH NODE BIOPSY Right 08/27/2012   Procedure: AXILLARY LYMPH NODE BIOPSY ;  Surgeon: Rolm Bookbinder, MD;  Location: [facility];  Service: General;  Laterality: Right;  . CHOLECYSTECTOMY N/A 12/28/2012   Procedure: LAPAROSCOPIC CHOLECYSTECTOMY WITH INTRAOPERATIVE CHOLANGIOGRAM;  Surgeon: Joyice Faster. Cornett, MD;  Location: [facility];  Service: General;  Laterality: N/A;  . TONSILLECTOMY    . WISDOM TOOTH EXTRACTION     Family History  Problem Relation Age of Onset  . Diabetes Mother   . Heart disease Mother   . Colon polyps Neg Hx   . Colon cancer Neg Hx    Socioeconomic History  . Marital status: Single    Spouse name: Not on file  . Number of children: 0  Occupational History  . Occupation: [employer]    Employer: [employer]    ROS Constitutional: Denies fever, chills, weight loss/gain, headaches, insomnia,  night sweats or change in appetite. Does c/o fatigue. Eyes: Denies redness, blurred vision, diplopia, discharge, itchy or watery eyes.  ENT: Denies  discharge, congestion, post nasal drip, epistaxis, sore throat, earache, hearing loss, dental pain, Tinnitus, Vertigo, Sinus pain or snoring.  Cardio: Denies chest pain, palpitations, irregular heartbeat, syncope, dyspnea, diaphoresis, orthopnea, PND, claudication or edema Respiratory: denies cough, dyspnea, DOE, pleurisy, hoarseness, laryngitis or wheezing.  Gastrointestinal: Denies dysphagia, heartburn, reflux, water brash, pain, cramps, nausea, vomiting, bloating, diarrhea, constipation, hematemesis, melena, hematochezia, jaundice or hemorrhoids Genitourinary: Denies dysuria, frequency, urgency, nocturia, hesitancy, discharge, hematuria or flank pain Musculoskeletal: Denies arthralgia, myalgia, stiffness, Jt. Swelling, pain, limp or strain/sprain. Denies Falls. Skin: Denies puritis, rash, hives, warts, acne, eczema or change in skin lesion Neuro: No weakness, tremor, incoordination, spasms, paresthesia or pain Psychiatric: Denies confusion, memory loss or sensory loss. Denies Depression. Endocrine: Denies change in weight, skin, hair change, nocturia, and paresthesia, diabetic  polys, visual blurring or hyper / hypo glycemic episodes.  Heme/Lymph: No excessive bleeding, bruising or enlarged lymph nodes.  Physical Exam  BP 112/70   Pulse 64   Temp (!) 97 F (36.1 C)   Resp 18   Ht 5' 10.5" (1.791 m)   Wt 261 lb (118.4 kg)   BMI 36.92 kg/m   General Appearance: Over nourished and well groomed and in no apparent distress.  Eyes: PERRLA, EOMs, conjunctiva no swelling or erythema, normal fundi and vessels. Sinuses: No frontal/maxillary tenderness ENT/Mouth: EACs patent / TMs  nl. Nares clear without erythema, swelling, mucoid exudates. Oral hygiene is good. No erythema, swelling, or exudate. Tongue normal, non-obstructing. Tonsils not swollen or erythematous. Hearing normal.  Neck: Supple, thyroid not palpable. No bruits, nodes or JVD. Respiratory: Respiratory effort normal.  BS equal and  clear bilateral without rales, rhonci, wheezing or stridor. Cardio: Heart sounds are normal with regular rate and rhythm and no murmurs, rubs or gallops. Peripheral pulses are normal and equal bilaterally without edema. No aortic or femoral bruits. Chest: symmetric with normal excursions and percussion.  Abdomen: Soft, with Nl bowel sounds. Nontender, no guarding, rebound, hernias, masses, or organomegaly.  Lymphatics: Non tender without lymphadenopathy.  Musculoskeletal: Full ROM all peripheral extremities, joint stability, 5/5 strength, and normal gait. Skin: Warm and dry without rashes, lesions, cyanosis, clubbing or  ecchymosis. Scattered patchy circumscribed alopecia.  Neuro: Cranial nerves intact, reflexes equal bilaterally. Normal muscle tone, no cerebellar symptoms. Sensation intact to touch, vibratory and Monofilament to the toes bilaterally. Pysch: Alert and oriented X 3 with normal affect, insight and judgment appropriate.   Assessment and Plan  1. Annual Preventative/Screening Exam   2. Essential hypertension  - EKG 12-Lead - [place], RETROPERITNL ABD,  LTD - Urinalysis, Routine w reflex microscopic - Microalbumin / creatinine urine ratio - CBC with Differential/Platelet - COMPLETE METABOLIC PANEL WITH GFR - Magnesium - TSH  3. Hyperlipidemia, mixed  - EKG 12-Lead - [place], RETROPERITNL ABD,  LTD - Lipid panel - TSH  4. Abnormal glucose  - EKG 12-Lead - [place], RETROPERITNL ABD,  LTD - Hemoglobin A1c - Insulin, random  5. Vitamin D deficiency  - VITAMIN D 25 Hydroxyl  6. Idiopathic gout  - Uric acid  7. Gastroesophageal reflux disease  - CBC with Differential/Platelet  8. 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   10. Testosterone  deficiency  - Testosterone  11. NASH (nonalcoholic steatohepatitis)  - long strong discussion re: better diet & weight loss  - COMPLETE METABOLIC PANEL WITH GFR  12. Screening for colorectal cancer  - POC Hemoccult Bld/Stl  13. Screening for prostate cancer  - PSA  14. Screening for ischemic heart disease  - EKG 12-Lead  15. FHx: heart disease  - EKG 12-Lead - [place], RETROPERITNL ABD,  LTD  16. Screening for AAA (aortic abdominal aneurysm)  - [place], RETROPERITNL ABD,  LTD  17. Screening examination for pulmonary tuberculosis  - TB Skin Test  18. Fatigue, unspecified type  - Iron,Total/Total Iron Binding Cap - Vitamin B12 - CBC with Differential/Platelet  19. Medication management  - Urinalysis, Routine w reflex microscopic - Microalbumin / creatinine urine ratio - Testosterone - Uric acid - CBC  with Differential/Platelet - COMPLETE METABOLIC PANEL WITH GFR - Magnesium - Lipid panel - TSH - Hemoglobin A1c - Insulin, random - VITAMIN D 25 Hydroxyl  20. 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  Discussed med effects and SE's. Routine screening labs and tests as requested with regular follow-up as recommended. I discussed the assessment and treatment plan as above with the patient. The patient was provided an opportunity to ask questions and all were answered. The patient agreed with the plan and demonstrated an understanding of the instructions.Over 40 minutes of exam, counseling, chart review and high complex critical decision making was performed  Kirtland Bouchard, MD

## 2018-08-27 ENCOUNTER — Other Ambulatory Visit: Payer: Self-pay

## 2018-08-27 ENCOUNTER — Ambulatory Visit: Payer: BLUE CROSS/BLUE SHIELD | Admitting: Internal Medicine

## 2018-08-27 ENCOUNTER — Encounter: Payer: Self-pay | Admitting: Internal Medicine

## 2018-08-27 VITALS — BP 112/70 | HR 64 | Temp 97.0°F | Resp 18 | Ht 70.5 in | Wt 261.0 lb

## 2018-08-27 DIAGNOSIS — M329 Systemic lupus erythematosus, unspecified: Secondary | ICD-10-CM

## 2018-08-27 DIAGNOSIS — Z1329 Encounter for screening for other suspected endocrine disorder: Secondary | ICD-10-CM

## 2018-08-27 DIAGNOSIS — R7309 Other abnormal glucose: Secondary | ICD-10-CM

## 2018-08-27 DIAGNOSIS — R350 Frequency of micturition: Secondary | ICD-10-CM | POA: Diagnosis not present

## 2018-08-27 DIAGNOSIS — N401 Enlarged prostate with lower urinary tract symptoms: Secondary | ICD-10-CM

## 2018-08-27 DIAGNOSIS — Z13220 Encounter for screening for lipoid disorders: Secondary | ICD-10-CM | POA: Diagnosis not present

## 2018-08-27 DIAGNOSIS — Z1389 Encounter for screening for other disorder: Secondary | ICD-10-CM

## 2018-08-27 DIAGNOSIS — Z8249 Family history of ischemic heart disease and other diseases of the circulatory system: Secondary | ICD-10-CM

## 2018-08-27 DIAGNOSIS — E559 Vitamin D deficiency, unspecified: Secondary | ICD-10-CM | POA: Diagnosis not present

## 2018-08-27 DIAGNOSIS — Z79899 Other long term (current) drug therapy: Secondary | ICD-10-CM

## 2018-08-27 DIAGNOSIS — K7581 Nonalcoholic steatohepatitis (NASH): Secondary | ICD-10-CM

## 2018-08-27 DIAGNOSIS — Z0001 Encounter for general adult medical examination with abnormal findings: Secondary | ICD-10-CM

## 2018-08-27 DIAGNOSIS — I7789 Other specified disorders of arteries and arterioles: Secondary | ICD-10-CM

## 2018-08-27 DIAGNOSIS — Z1212 Encounter for screening for malignant neoplasm of rectum: Secondary | ICD-10-CM

## 2018-08-27 DIAGNOSIS — Z136 Encounter for screening for cardiovascular disorders: Secondary | ICD-10-CM | POA: Diagnosis not present

## 2018-08-27 DIAGNOSIS — G63 Polyneuropathy in diseases classified elsewhere: Secondary | ICD-10-CM

## 2018-08-27 DIAGNOSIS — I10 Essential (primary) hypertension: Secondary | ICD-10-CM

## 2018-08-27 DIAGNOSIS — Z1211 Encounter for screening for malignant neoplasm of colon: Secondary | ICD-10-CM

## 2018-08-27 DIAGNOSIS — Z0000 Encounter for general adult medical examination without abnormal findings: Secondary | ICD-10-CM | POA: Diagnosis not present

## 2018-08-27 DIAGNOSIS — Z125 Encounter for screening for malignant neoplasm of prostate: Secondary | ICD-10-CM | POA: Diagnosis not present

## 2018-08-27 DIAGNOSIS — K219 Gastro-esophageal reflux disease without esophagitis: Secondary | ICD-10-CM

## 2018-08-27 DIAGNOSIS — Z111 Encounter for screening for respiratory tuberculosis: Secondary | ICD-10-CM

## 2018-08-27 DIAGNOSIS — E782 Mixed hyperlipidemia: Secondary | ICD-10-CM

## 2018-08-27 DIAGNOSIS — Z131 Encounter for screening for diabetes mellitus: Secondary | ICD-10-CM

## 2018-08-27 DIAGNOSIS — Z130 Encounter for screening for diseases of the blood and blood-forming organs and certain disorders involving the immune mechanism: Secondary | ICD-10-CM

## 2018-08-27 DIAGNOSIS — E349 Endocrine disorder, unspecified: Secondary | ICD-10-CM

## 2018-08-27 DIAGNOSIS — R5383 Other fatigue: Secondary | ICD-10-CM

## 2018-08-27 DIAGNOSIS — M1000 Idiopathic gout, unspecified site: Secondary | ICD-10-CM

## 2018-08-27 DIAGNOSIS — R7303 Prediabetes: Secondary | ICD-10-CM

## 2018-08-27 DIAGNOSIS — M3219 Other organ or system involvement in systemic lupus erythematosus: Secondary | ICD-10-CM

## 2018-08-28 ENCOUNTER — Other Ambulatory Visit: Payer: Self-pay | Admitting: Internal Medicine

## 2018-08-28 DIAGNOSIS — E782 Mixed hyperlipidemia: Secondary | ICD-10-CM

## 2018-08-28 LAB — PSA: PSA: 0.5 ng/mL (ref <=4.0)

## 2018-08-28 LAB — CBC WITH DIFFERENTIAL/PLATELET
Absolute Monocytes: 672 cells/uL (ref 200–950)
Basophils Absolute: 38 cells/uL (ref 0–200)
Basophils Relative: 0.4 %
Eosinophils Absolute: 154 cells/uL (ref 15–500)
Eosinophils Relative: 1.6 %
HCT: 40.9 % (ref 38.5–50.0)
Hemoglobin: 14 g/dL (ref 13.2–17.1)
Lymphs Abs: 1171 cells/uL (ref 850–3900)
MCH: 31.7 pg (ref 27.0–33.0)
MCHC: 34.2 g/dL (ref 32.0–36.0)
MCV: 92.7 fL (ref 80.0–100.0)
MPV: 10.5 fL (ref 7.5–12.5)
Monocytes Relative: 7 %
Neutro Abs: 7565 cells/uL (ref 1500–7800)
Neutrophils Relative %: 78.8 %
Platelets: 296 10*3/uL (ref 140–400)
RBC: 4.41 10*6/uL (ref 4.20–5.80)
RDW: 12.4 % (ref 11.0–15.0)
Total Lymphocyte: 12.2 %
WBC: 9.6 10*3/uL (ref 3.8–10.8)

## 2018-08-28 LAB — COMPLETE METABOLIC PANEL WITH GFR
AG Ratio: 1.3 (calc) (ref 1.0–2.5)
ALT: 45 U/L (ref 9–46)
AST: 45 U/L — ABNORMAL HIGH (ref 10–35)
Albumin: 4.1 g/dL (ref 3.6–5.1)
Alkaline phosphatase (APISO): 57 U/L (ref 35–144)
BUN: 22 mg/dL (ref 7–25)
CO2: 28 mmol/L (ref 20–32)
Calcium: 9.8 mg/dL (ref 8.6–10.3)
Chloride: 100 mmol/L (ref 98–110)
Creat: 1.24 mg/dL (ref 0.70–1.25)
GFR, Est African American: 72 mL/min/{1.73_m2} (ref >=60)
GFR, Est Non African American: 62 mL/min/{1.73_m2} (ref >=60)
Globulin: 3.1 g/dL (calc) (ref 1.9–3.7)
Glucose, Bld: 126 mg/dL — ABNORMAL HIGH (ref 65–99)
Potassium: 4.6 mmol/L (ref 3.5–5.3)
Sodium: 137 mmol/L (ref 135–146)
Total Bilirubin: 0.7 mg/dL (ref 0.2–1.2)
Total Protein: 7.2 g/dL (ref 6.1–8.1)

## 2018-08-28 LAB — HEMOGLOBIN A1C
Hgb A1c MFr Bld: 6.7 % of total Hgb — ABNORMAL HIGH (ref <5.7)
Mean Plasma Glucose: 146 (calc)
eAG (mmol/L): 8.1 (calc)

## 2018-08-28 LAB — URINALYSIS, ROUTINE W REFLEX MICROSCOPIC
Bilirubin Urine: NEGATIVE
Glucose, UA: NEGATIVE
Hgb urine dipstick: NEGATIVE
Leukocytes,Ua: NEGATIVE
Nitrite: NEGATIVE
Protein, ur: NEGATIVE
Specific Gravity, Urine: 1.017 (ref 1.001–1.03)
pH: 6 (ref 5.0–8.0)

## 2018-08-28 LAB — VITAMIN D 25 HYDROXY (VIT D DEFICIENCY, FRACTURES): Vit D, 25-Hydroxy: 55 ng/mL (ref 30–100)

## 2018-08-28 LAB — IRON, TOTAL/TOTAL IRON BINDING CAP
Iron: 128 ug/dL (ref 50–180)
TIBC: 267 mcg/dL (calc) (ref 250–425)

## 2018-08-28 LAB — MICROALBUMIN / CREATININE URINE RATIO
Creatinine, Urine: 150 mg/dL (ref 20–320)
Microalb Creat Ratio: 11 mcg/mg creat (ref <30)
Microalb, Ur: 1.7 mg/dL

## 2018-08-28 LAB — IRON,?TOTAL/TOTAL IRON BINDING CAP: %SAT: 48 % (calc) (ref 20–48)

## 2018-08-28 LAB — LIPID PANEL
Cholesterol: 208 mg/dL — ABNORMAL HIGH (ref <200)
HDL: 31 mg/dL — ABNORMAL LOW (ref >=40)
LDL Cholesterol (Calc): 145 mg/dL (calc) — ABNORMAL HIGH
Non-HDL Cholesterol (Calc): 177 mg/dL (calc) — ABNORMAL HIGH (ref <130)
Total CHOL/HDL Ratio: 6.7 (calc) — ABNORMAL HIGH (ref <5.0)
Triglycerides: 184 mg/dL — ABNORMAL HIGH (ref <150)

## 2018-08-28 LAB — INSULIN, RANDOM: Insulin: 17.2 u[IU]/mL

## 2018-08-28 LAB — URIC ACID: Uric Acid, Serum: 5.3 mg/dL (ref 4.0–8.0)

## 2018-08-28 LAB — TSH: TSH: 1.44 mIU/L (ref 0.40–4.50)

## 2018-08-28 LAB — VITAMIN B12: Vitamin B-12: 445 pg/mL (ref 200–1100)

## 2018-08-28 LAB — TESTOSTERONE: Testosterone: 521 ng/dL (ref 250–827)

## 2018-08-28 LAB — MAGNESIUM: Magnesium: 1.9 mg/dL (ref 1.5–2.5)

## 2018-08-28 MED ORDER — ROSUVASTATIN CALCIUM 20 MG PO TABS: ORAL_TABLET | ORAL | 1 refills | Status: DC

## 2018-08-29 LAB — TB SKIN TEST
Induration: 0 mm
TB Skin Test: NEGATIVE

## 2018-09-06 ENCOUNTER — Encounter: Payer: Self-pay | Admitting: Internal Medicine

## 2018-09-28 DIAGNOSIS — Z79899 Other long term (current) drug therapy: Secondary | ICD-10-CM | POA: Diagnosis not present

## 2018-09-28 DIAGNOSIS — M329 Systemic lupus erythematosus, unspecified: Secondary | ICD-10-CM | POA: Diagnosis not present

## 2018-09-28 DIAGNOSIS — M3219 Other organ or system involvement in systemic lupus erythematosus: Secondary | ICD-10-CM | POA: Diagnosis not present

## 2018-10-04 ENCOUNTER — Other Ambulatory Visit: Payer: Self-pay | Admitting: Internal Medicine

## 2018-10-29 ENCOUNTER — Other Ambulatory Visit: Payer: Self-pay | Admitting: Internal Medicine

## 2018-10-29 ENCOUNTER — Other Ambulatory Visit: Payer: Self-pay | Admitting: Adult Health

## 2018-11-26 DIAGNOSIS — N182 Chronic kidney disease, stage 2 (mild): Secondary | ICD-10-CM | POA: Insufficient documentation

## 2018-11-26 DIAGNOSIS — E1122 Type 2 diabetes mellitus with diabetic chronic kidney disease: Secondary | ICD-10-CM | POA: Insufficient documentation

## 2018-11-26 NOTE — Progress Notes (Signed)
FOLLOW UP  Assessment and Plan:   Hypertension Well controlled with current medications per historical values and home checks Monitor blood pressure at home; patient to call if consistently greater than 130/80 Continue DASH diet.   Reminder to go to the ER if any CP, SOB, nausea, dizziness, severe HA, changes vision/speech, left arm numbness and tingling and jaw pain.  Cholesterol Currently above goal; intolerant of atorvastatin, zocor; zetia ineffective and very high copay. Trying rosuvastatin 10 mg tab Mon/Th and tolerating well  Continue low cholesterol diet and exercise.  Check lipid panel.   NASH Weight loss and low GI diet advised, will monitor LFTs  Lifestyle managed T2DM Continue diet and exercise.  Perform daily foot/skin check, notify office of any concerning changes.  Check A1C  Obesity with co morbidities Long discussion about weight loss, diet, and exercise Recommended diet heavy in fruits and veggies and low in animal meats, cheeses, and dairy products, appropriate calorie intake Discussed ideal weight for height  Patient will work on low GI diet, whole foods/plant based for antiinflammatory benefits discussed at length Will follow up in 3 months  Vitamin D Def At goal at last visit; continue supplementation to maintain goal of 70-100 Defer Vit D level  Gout Continue allopurinol Diet discussed Check uric acid as needed   Lupus Intolerant of imuran Stable on prednisone; continue to monitor Followed by Jeanmarie Plant  Continue diet and meds as discussed. Further disposition pending results of labs. Discussed med's effects and SE's.   Over 30 minutes of exam, counseling, chart review, and critical decision making was performed.   Future Appointments  Date Time Provider [place]  03/04/2019 10:30 AM [name], MD [facility] None  09/04/2019  2:00 PM [name], MD [facility] None     ----------------------------------------------------------------------------------------------------------------------  HPI 63 y.o. male  presents for 3 month follow up on hypertension, cholesterol, diabetes, obesity, gout and vitamin D deficiency.   Patient was diagnosed with cutaneous Lupus in 2017 and is followed at [facility]  Last fall (Aug 2018), patient had a severe intolerant or allergic reaction to Imuran. Since then his cutaneous Lupus symptoms seem reasonablycontrolled on Prednisone currently tapering, down to alternating 5 mg and 10 mg.   He is on celexa and rare PRN xanax for anxiety exacerbated by prednisone.    BMI is Body mass index is 37.34 kg/m., he has been working on exercise, admits to poor diet, has cut out sodas but reports covid 19.  Wt Readings from Last 3 Encounters:  11/27/18 264 lb (119.7 kg)  08/27/18 261 lb (118.4 kg)  04/16/18 257 lb (116.6 kg)   His blood pressure has been controlled at home (120s/80s), today their BP is BP: (!) 136/98 Admits he didn't take medication until just a short while ago.   He does workout. He denies chest pain, shortness of breath, dizziness.    He is on cholesterol medication (cannot tolerate lipitor, zocor, insignificant progress with zetia, fenofibrate, now on rosuvastatin 10 mg twice weekly and tolerating well ) and denies myalgias. His cholesterol is not at goal. The cholesterol last visit was:    Lab Results  Component Value Date   CHOL 208 (H) 08/27/2018   HDL 31 (L) 08/27/2018   LDLCALC 145 (H) 08/27/2018   TRIG 184 (H) 08/27/2018   CHOLHDL 6.7 (H) 08/27/2018     He has been working on diet and exercise for prediabetes/recently progressed to diabetes, currently managed by lifestyle, and denies foot ulcerations, increased appetite, nausea,  polydipsia, polyuria, visual disturbances, vomiting and weight loss. Last A1C in the office was:  Lab Results  Component Value Date   HGBA1C 6.7 (H) 08/27/2018   Patient  is on Vitamin D supplement.   Lab Results  Component Value Date   VD25OH 55 08/27/2018     Patient has hx of gout though has stopped taking allopurinol, controlled on low dose prednisone, and does not report a recent flare.  Lab Results  Component Value Date   LABURIC 5.3 08/27/2018      Current Medications:  Current Outpatient Medications on File Prior to Visit  Medication Sig  . allopurinol (ZYLOPRIM) 300 MG tablet Take 1 tablet Daily to Prevent Gout  . ALPRAZolam (XANAX) 1 MG tablet TAKE 1/2-1 TAB 2-3 TIMES A DAY ONLY IF NEEDED FOR ANXIETY.TRY TO LIMIT TO 5DAYS/WEEK.  Marland Kitchen aspirin EC 81 MG tablet Take 81 mg by mouth.  Marland Kitchen atenolol (TENORMIN) 100 MG tablet Take 1 tablet (100 mg total) by mouth daily. for blood pressure  . celecoxib (CELEBREX) 200 MG capsule Take 1 capsule daily for Pain & Inflammation  . Cholecalciferol (VITAMIN D3) 5000 UNITS CAPS Take 1 capsule by mouth daily.  Marland Kitchen ezetimibe (ZETIA) 10 MG tablet TAKE 1 TABLET DAILY FOR CHOLESTEROL  . gabapentin (NEURONTIN) 100 MG capsule TAKE 1 CAPSULE UP TO 3 TIMES DAILY FOR NEUROPATHY PAIN. (Patient taking differently: TAKE 1 CAPSULE UP TO ONCE  DAILY FOR NEUROPATHY PAIN.)  . hydrOXYzine (ATARAX/VISTARIL) 25 MG tablet TAKE 2 TABLETS THREE TIMES DAILY AS NEEDED FOR ITCHING.  Marland Kitchen lisinopril-hydrochlorothiazide (ZESTORETIC) 20-25 MG tablet Take 1 tablet Daily for BP  . Magnesium 400 MG TABS Take 1 tablet by mouth daily.  Marland Kitchen MILK THISTLE PO Take 1 tablet by mouth daily.  . Multiple Vitamin (MULTIVITAMIN) capsule Take 1 capsule by mouth daily.  . Omega-3 Fatty Acids (FISH OIL PO) Take 1 capsule by mouth daily.  Marland Kitchen OVER THE COUNTER MEDICATION Takes Alphalophoric Acid 1 capsule daily  . predniSONE (DELTASONE) 10 MG tablet TAKE 1 OR 2 TABLETS DAILY AS DIRECTED BY DR.  Marland Kitchen Probiotic Product (PROBIOTIC DAILY) CAPS Take 1 capsule by mouth daily.  . rosuvastatin (CRESTOR) 20 MG tablet Take 1 tablet daily for Cholesterol (Patient taking differently:  Take 1/2 tablet on Mondays and Thurdays)  . triamcinolone cream (KENALOG) 0.1 % Apply 1 application topically 3 (three) times daily. (Patient taking differently: Apply 1 application topically as needed. )  . TURMERIC PO Take 2,000 mg by mouth daily.   . [DISCONTINUED] fenofibrate micronized (LOFIBRA) 134 MG capsule Take 134 mg by mouth daily.   No current facility-administered medications on file prior to visit.      Allergies:  Allergies  Allergen Reactions  . Imuran [Azathioprine] Rash  . Lipitor [Atorvastatin]     Myalgias   . Penicillins     Causes lupus flare ups  . Zocor [Simvastatin]     Myalgias     Medical History:  Past Medical History:  Diagnosis Date  . Asthma   . GERD (gastroesophageal reflux disease)   . Hypertension   . Lupus ([place])   . Prediabetes   . Seasonal allergies   . Vitamin D deficiency    Family history- Reviewed and unchanged Social history- Reviewed and unchanged   Review of Systems:  Review of Systems  Constitutional: Negative for malaise/fatigue and weight loss.  HENT: Negative for hearing loss and tinnitus.   Eyes: Negative for blurred vision and double vision.  Respiratory: Negative for  cough, shortness of breath and wheezing.   Cardiovascular: Negative for chest pain, palpitations, orthopnea, claudication and leg swelling.  Gastrointestinal: Negative for abdominal pain, blood in stool, constipation, diarrhea, heartburn, melena, nausea and vomiting.  Genitourinary: Negative.   Musculoskeletal: Negative for joint pain and myalgias.  Skin: Negative for rash.  Neurological: Positive for tingling (bilateral feet). Negative for dizziness, sensory change, weakness and headaches.  Endo/Heme/Allergies: Negative for polydipsia.  Psychiatric/Behavioral: Negative.   All other systems reviewed and are negative.   Physical Exam: BP (!) 136/98   Pulse 66   Temp (!) 97.5 F (36.4 C)   Ht 5' 10.5" (1.791 m)   Wt 264 lb (119.7 kg)   SpO2 99%    BMI 37.34 kg/m  Wt Readings from Last 3 Encounters:  11/27/18 264 lb (119.7 kg)  08/27/18 261 lb (118.4 kg)  04/16/18 257 lb (116.6 kg)     General Appearance: Well nourished, in no apparent distress. Eyes: PERRLA, EOMs, conjunctiva no swelling or erythema Sinuses: No Frontal/maxillary tenderness ENT/Mouth: Ext aud canals clear, TMs without erythema, bulging. No erythema, swelling, or exudate on post pharynx.  Tonsils not swollen or erythematous. Hearing normal.  Neck: Supple, thyroid normal.  Respiratory: Respiratory effort normal, BS equal bilaterally without rales, rhonchi, wheezing or stridor.  Cardio: RRR with no MRGs. Brisk peripheral pulses without edema.  Abdomen: Soft, + BS.  Non tender, no guarding, rebound, hernias, masses. Lymphatics: Non tender without lymphadenopathy.  Musculoskeletal: Full ROM, 5/5 strength, Normal gait Skin: Warm, dry; Scattered erythematous to velvety pink to violaceous circumscribed lesions occasionally excoriated and some almost plaque-like over the scalp, trunk & extremities.  Neuro: Cranial nerves intact. No cerebellar symptoms.  Psych: Awake and oriented X 3, normal affect, Insight and Judgment appropriate.    Izora Ribas, NP 10:53 AM [facility]

## 2018-11-27 ENCOUNTER — Other Ambulatory Visit: Payer: Self-pay | Admitting: Adult Health

## 2018-11-27 ENCOUNTER — Ambulatory Visit (INDEPENDENT_AMBULATORY_CARE_PROVIDER_SITE_OTHER): Payer: BLUE CROSS/BLUE SHIELD | Admitting: Adult Health

## 2018-11-27 ENCOUNTER — Other Ambulatory Visit: Payer: Self-pay

## 2018-11-27 ENCOUNTER — Encounter: Payer: Self-pay | Admitting: Adult Health

## 2018-11-27 VITALS — BP 136/98 | HR 66 | Temp 97.5°F | Ht 70.5 in | Wt 264.0 lb

## 2018-11-27 DIAGNOSIS — K7581 Nonalcoholic steatohepatitis (NASH): Secondary | ICD-10-CM

## 2018-11-27 DIAGNOSIS — E559 Vitamin D deficiency, unspecified: Secondary | ICD-10-CM | POA: Diagnosis not present

## 2018-11-27 DIAGNOSIS — I10 Essential (primary) hypertension: Secondary | ICD-10-CM | POA: Diagnosis not present

## 2018-11-27 DIAGNOSIS — K219 Gastro-esophageal reflux disease without esophagitis: Secondary | ICD-10-CM | POA: Diagnosis not present

## 2018-11-27 DIAGNOSIS — R7303 Prediabetes: Secondary | ICD-10-CM

## 2018-11-27 DIAGNOSIS — E782 Mixed hyperlipidemia: Secondary | ICD-10-CM

## 2018-11-27 DIAGNOSIS — E669 Obesity, unspecified: Secondary | ICD-10-CM

## 2018-11-27 DIAGNOSIS — Z79899 Other long term (current) drug therapy: Secondary | ICD-10-CM | POA: Diagnosis not present

## 2018-11-27 DIAGNOSIS — M1000 Idiopathic gout, unspecified site: Secondary | ICD-10-CM

## 2018-11-27 DIAGNOSIS — F419 Anxiety disorder, unspecified: Secondary | ICD-10-CM

## 2018-11-27 DIAGNOSIS — E1122 Type 2 diabetes mellitus with diabetic chronic kidney disease: Secondary | ICD-10-CM

## 2018-11-27 DIAGNOSIS — N182 Chronic kidney disease, stage 2 (mild): Secondary | ICD-10-CM

## 2018-11-27 DIAGNOSIS — M3219 Other organ or system involvement in systemic lupus erythematosus: Secondary | ICD-10-CM

## 2018-11-27 NOTE — Patient Instructions (Addendum)
Goals     Blood Pressure < 130/80     HEMOGLOBIN A1C < 5.7     Weight (lb) < 250 lb (113.4 kg)       General weight loss tips   Drink 1/2 your body weight in fluid ounces of water daily; drink a tall glass of water 30 min before meals  Don't eat until you're stuffed- listen to your stomach and eat until you are 80% full   Try eating off of a salad plate; wait 10 min after finishing before going back for seconds  Start by eating the vegetables on your plate; aim for 50% of your meals to be fruits or vegetables  Then eat your protein - lean meats (grass fed if possible), fish, beans, nuts in moderation  Eat your carbs/starch last ONLY if you still are hungry. If you can, stop before finishing it all  Avoid sugar and flour - the closer it looks to it's original form in nature, typically the better it is for you  Splurge in moderation - "assign" days when you get to splurge and have the "bad stuff" - I like to follow a 80% - 20% plan- "good" choices 80 % of the time, "bad" choices in moderation 20% of the time  Simple equation is: Calories out > calories in = weight loss - even if you eat the bad stuff, if you limit portions, you will still lose weight    Preventing Type 2 Diabetes Mellitus Type 2 diabetes (type 2 diabetes mellitus) is a long-term (chronic) disease that affects blood sugar (glucose) levels. Normally, a hormone called insulin allows glucose to enter cells in the body. The cells use glucose for energy. In type 2 diabetes, one or both of these problems may be present:  The body does not make enough insulin.  The body does not respond properly to insulin that it makes (insulin resistance). Insulin resistance or lack of insulin causes excess glucose to build up in the blood instead of going into cells. As a result, high blood glucose (hyperglycemia) develops, which can cause many complications. Being overweight or obese and having an inactive (sedentary) lifestyle can  increase your risk for diabetes. Type 2 diabetes can be delayed or prevented by making certain nutrition and lifestyle changes. What nutrition changes can be made?   Eat healthy meals and snacks regularly. Keep a healthy snack with you for when you get hungry between meals, such as fruit or a handful of nuts.  Eat lean meats and proteins that are low in saturated fats, such as chicken, fish, egg whites, and beans. Avoid processed meats.  Eat plenty of fruits and vegetables and plenty of grains that have not been processed (whole grains). It is recommended that you eat: ? 1?2 cups of fruit every day. ? 2?3 cups of vegetables every day. ? 6?8 oz of whole grains every day, such as oats, whole wheat, bulgur, brown rice, quinoa, and millet.  Eat low-fat dairy products, such as milk, yogurt, and cheese.  Eat foods that contain healthy fats, such as nuts, avocado, olive oil, and canola oil.  Drink water throughout the day. Avoid drinks that contain added sugar, such as soda or sweet tea.  Follow instructions from your health care provider about specific eating or drinking restrictions.  Control how much food you eat at a time (portion size). ? Check food labels to find out the serving sizes of foods. ? Use a kitchen scale to weigh amounts of foods.  Saute or steam food instead of frying it. Cook with water or broth instead of oils or butter.  Limit your intake of: ? Salt (sodium). Have no more than 1 tsp (2,400 mg) of sodium a day. If you have heart disease or high blood pressure, have less than ? tsp (1,500 mg) of sodium a day. ? Saturated fat. This is fat that is solid at room temperature, such as butter or fat on meat. What lifestyle changes can be made? Activity   Do moderate-intensity physical activity for at least 30 minutes on at least 5 days of the week, or as much as told by your health care provider.  Ask your health care provider what activities are safe for you. A mix of  physical activities may be best, such as walking, swimming, cycling, and strength training.  Try to add physical activity into your day. For example: ? Park in spots that are farther away than usual, so that you walk more. For example, park in a far corner of the parking lot when you go to the office or the grocery store. ? Take a walk during your lunch break. ? Use stairs instead of elevators or escalators. Weight Loss  Lose weight as directed. Your health care provider can determine how much weight loss is best for you and can help you lose weight safely.  If you are overweight or obese, you may be instructed to lose at least 5?7 % of your body weight. Alcohol and Tobacco   Limit alcohol intake to no more than 1 drink a day for nonpregnant women and 2 drinks a day for men. One drink equals 12 oz of beer, 5 oz of wine, or 1 oz of hard liquor.  Do not use any tobacco products, such as cigarettes, chewing tobacco, and e-cigarettes. If you need help quitting, ask your health care provider. Work With [place] Provider  Have your blood glucose tested regularly, as told by your health care provider.  Discuss your risk factors and how you can reduce your risk for diabetes.  Get screening tests as told by your health care provider. You may have screening tests regularly, especially if you have certain risk factors for type 2 diabetes.  Make an appointment with a diet and nutrition specialist (registered dietitian). A registered dietitian can help you make a healthy eating plan and can help you understand portion sizes and food labels. Why are these changes important?  It is possible to prevent or delay type 2 diabetes and related health problems by making lifestyle and nutrition changes.  It can be difficult to recognize signs of type 2 diabetes. The best way to avoid possible damage to your body is to take actions to prevent the disease before you develop symptoms. What can happen  if changes are not made?  Your blood glucose levels may keep increasing. Having high blood glucose for a long time is dangerous. Too much glucose in your blood can damage your blood vessels, heart, kidneys, nerves, and eyes.  You may develop prediabetes or type 2 diabetes. Type 2 diabetes can lead to many chronic health problems and complications, such as: ? Heart disease. ? Stroke. ? Blindness. ? Kidney disease. ? Depression. ? Poor circulation in the feet and legs, which could lead to surgical removal (amputation) in severe cases. Where to find support  Ask your health care provider to recommend a registered dietitian, diabetes educator, or weight loss program.  Look for local or online weight  loss groups.  Join a gym, fitness club, or outdoor activity group, such as a walking club. Where to find more information To learn more about diabetes and diabetes prevention, visit:  American Diabetes Association (ADA): www.diabetes.[employer] of Diabetes and Digestive and Kidney Diseases: FindSpin.nl To learn more about healthy eating, visit:  The U.S. Department of Agriculture Scientist, research (physical sciences)), Choose My Plate: http://wiley-williams.com/  Office of Disease Prevention and Health Promotion (ODPHP), Dietary Guidelines: SurferLive.at Summary  You can reduce your risk for type 2 diabetes by increasing your physical activity, eating healthy foods, and losing weight as directed.  Talk with your health care provider about your risk for type 2 diabetes. Ask about any blood tests or screening tests that you need to have. This information is not intended to replace advice given to you by your health care provider. Make sure you discuss any questions you have with your health care provider. Document Released: 08/17/2015 Document Revised: 08/17/2018 Document Reviewed: 06/16/2015 Elsevier Patient Education  2020 [employer].

## 2018-11-28 ENCOUNTER — Encounter: Payer: Self-pay | Admitting: Adult Health

## 2018-11-28 DIAGNOSIS — Z79899 Other long term (current) drug therapy: Secondary | ICD-10-CM | POA: Diagnosis not present

## 2018-11-28 LAB — CBC WITH DIFFERENTIAL/PLATELET
Absolute Monocytes: 523 cells/uL (ref 200–950)
Basophils Absolute: 38 cells/uL (ref 0–200)
Basophils Relative: 0.4 %
Eosinophils Absolute: 171 cells/uL (ref 15–500)
Eosinophils Relative: 1.8 %
HCT: 40.8 % (ref 38.5–50.0)
Hemoglobin: 13.5 g/dL (ref 13.2–17.1)
Lymphs Abs: 960 cells/uL (ref 850–3900)
MCH: 32 pg (ref 27.0–33.0)
MCHC: 33.1 g/dL (ref 32.0–36.0)
MCV: 96.7 fL (ref 80.0–100.0)
MPV: 10.8 fL (ref 7.5–12.5)
Monocytes Relative: 5.5 %
Neutro Abs: 7809 cells/uL — ABNORMAL HIGH (ref 1500–7800)
Neutrophils Relative %: 82.2 %
Platelets: 253 10*3/uL (ref 140–400)
RBC: 4.22 10*6/uL (ref 4.20–5.80)
RDW: 12.8 % (ref 11.0–15.0)
Total Lymphocyte: 10.1 %
WBC: 9.5 10*3/uL (ref 3.8–10.8)

## 2018-11-28 LAB — COMPLETE METABOLIC PANEL WITH GFR
AG Ratio: 1.5 (calc) (ref 1.0–2.5)
ALT: 37 U/L (ref 9–46)
AST: 38 U/L — ABNORMAL HIGH (ref 10–35)
Albumin: 4 g/dL (ref 3.6–5.1)
Alkaline phosphatase (APISO): 46 U/L (ref 35–144)
BUN: 20 mg/dL (ref 7–25)
CO2: 31 mmol/L (ref 20–32)
Calcium: 9.4 mg/dL (ref 8.6–10.3)
Chloride: 102 mmol/L (ref 98–110)
Creat: 1.02 mg/dL (ref 0.70–1.25)
GFR, Est African American: 92 mL/min/{1.73_m2} (ref >=60)
GFR, Est Non African American: 79 mL/min/{1.73_m2} (ref >=60)
Globulin: 2.6 g/dL (calc) (ref 1.9–3.7)
Glucose, Bld: 124 mg/dL — ABNORMAL HIGH (ref 65–99)
Potassium: 4.6 mmol/L (ref 3.5–5.3)
Sodium: 138 mmol/L (ref 135–146)
Total Bilirubin: 0.5 mg/dL (ref 0.2–1.2)
Total Protein: 6.6 g/dL (ref 6.1–8.1)

## 2018-11-28 LAB — HEMOGLOBIN A1C
Hgb A1c MFr Bld: 6.2 % of total Hgb — ABNORMAL HIGH (ref <5.7)
Mean Plasma Glucose: 131 (calc)
eAG (mmol/L): 7.3 (calc)

## 2018-11-28 LAB — LIPID PANEL
Cholesterol: 174 mg/dL (ref <200)
HDL: 34 mg/dL — ABNORMAL LOW (ref >=40)
LDL Cholesterol (Calc): 121 mg/dL (calc) — ABNORMAL HIGH
Non-HDL Cholesterol (Calc): 140 mg/dL (calc) — ABNORMAL HIGH (ref <130)
Total CHOL/HDL Ratio: 5.1 (calc) — ABNORMAL HIGH (ref <5.0)
Triglycerides: 88 mg/dL (ref <150)

## 2018-11-28 LAB — MAGNESIUM: Magnesium: 1.7 mg/dL (ref 1.5–2.5)

## 2018-11-28 LAB — TSH: TSH: 1.93 mIU/L (ref 0.40–4.50)

## 2018-12-18 DIAGNOSIS — H348312 Tributary (branch) retinal vein occlusion, right eye, stable: Secondary | ICD-10-CM | POA: Diagnosis not present

## 2018-12-18 DIAGNOSIS — H31092 Other chorioretinal scars, left eye: Secondary | ICD-10-CM | POA: Diagnosis not present

## 2018-12-18 DIAGNOSIS — H43813 Vitreous degeneration, bilateral: Secondary | ICD-10-CM | POA: Diagnosis not present

## 2018-12-18 DIAGNOSIS — H353122 Nonexudative age-related macular degeneration, left eye, intermediate dry stage: Secondary | ICD-10-CM | POA: Diagnosis not present

## 2019-01-15 ENCOUNTER — Other Ambulatory Visit: Payer: Self-pay | Admitting: Internal Medicine

## 2019-01-15 ENCOUNTER — Other Ambulatory Visit: Payer: Self-pay | Admitting: Adult Health

## 2019-01-15 DIAGNOSIS — G63 Polyneuropathy in diseases classified elsewhere: Secondary | ICD-10-CM

## 2019-02-04 DIAGNOSIS — M329 Systemic lupus erythematosus, unspecified: Secondary | ICD-10-CM | POA: Diagnosis not present

## 2019-02-04 DIAGNOSIS — Z7952 Long term (current) use of systemic steroids: Secondary | ICD-10-CM | POA: Diagnosis not present

## 2019-02-04 DIAGNOSIS — Z6836 Body mass index (BMI) 36.0-36.9, adult: Secondary | ICD-10-CM | POA: Diagnosis not present

## 2019-02-04 DIAGNOSIS — E669 Obesity, unspecified: Secondary | ICD-10-CM | POA: Diagnosis not present

## 2019-02-04 DIAGNOSIS — Z79899 Other long term (current) drug therapy: Secondary | ICD-10-CM | POA: Diagnosis not present

## 2019-03-03 ENCOUNTER — Encounter: Payer: Self-pay | Admitting: Internal Medicine

## 2019-03-03 NOTE — Patient Instructions (Signed)

## 2019-03-03 NOTE — Progress Notes (Signed)
History of Present Illness:      This very nice 63 y.o. single WM presents for 6 month follow up with HTN, HLD, Pre-Diabetes and Vitamin D Deficiency. Patient has Gout quiescent on Allopurinol. 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       Patient is treated for HTN (Dx'd 2004 & Tx started 2008).  BP has been controlled at home. Today's BP is at goal - 120/86. Patient has had no complaints of any cardiac type chest pain, palpitations, dyspnea / orthopnea / PND, dizziness, claudication, or dependent edema.      Hyperlipidemia is not controlled with diet & Zetia (Statin Intolerant). Patient denies myalgias or other med SE's. Last Lipids were not at goal:  Lab Results  Component Value Date   CHOL 174 11/27/2018   HDL 34 (L) 11/27/2018   LDLCALC 121 (H) 11/27/2018   TRIG 88 11/27/2018   CHOLHDL 5.1 (H) 11/27/2018        Also, the patient has Morbid Obesity (BMI 36+) and history of PreDiabetes (A1c 5.9% / 2011 & 6.0% / 2016) and has had no symptoms of reactive hypoglycemia, diabetic polys, paresthesias or visual blurring.  Last A1c was not at goal:   Lab Results  Component Value Date   HGBA1C 6.2 (H) 11/27/2018           Further, the patient also has history of Vitamin D Deficiency ("42" / 2008)  and supplements vitamin D without any suspected side-effects. Last vitamin D was near goal: Lab Results  Component Value Date   VD25OH 55 08/27/2018    Current Outpatient Medications on File Prior to Visit  Medication Sig  . allopurinol (ZYLOPRIM) 300 MG tablet Take 1 tablet Daily to Prevent Gout  . ALPRAZolam (XANAX) 1 MG tablet Take 1/2-1 tablet 2 - 3 x /day ONLY if needed for Anxiety Attack &  limit to 5 days /week to avoid addiction  . aspirin EC 81 MG tablet Take 81 mg by mouth.  Marland Kitchen atenolol (TENORMIN) 100 MG tablet TAKE ONE TABLET DAILY TO HELP CONTROL BLOOD PRESSURE.  . celecoxib (CELEBREX) 200  MG capsule Take 1 capsule daily for Pain & Inflammation  . Cholecalciferol (VITAMIN D3) 5000 UNITS CAPS Take 1 capsule by mouth daily.  Marland Kitchen ezetimibe (ZETIA) 10 MG tablet TAKE 1 TABLET DAILY FOR CHOLESTEROL  . gabapentin (NEURONTIN) 100 MG capsule Take 1 capsule 2 to 3 x /day as needed for Neuropathy Pain  . hydrOXYzine (ATARAX/VISTARIL) 25 MG tablet TAKE 2 TABLETS THREE TIMES DAILY AS NEEDED FOR ITCHING.  Marland Kitchen lisinopril-hydrochlorothiazide (ZESTORETIC) 20-25 MG tablet Take 1 tablet Daily for BP  . Magnesium 400 MG TABS Take 1 tablet by mouth daily.  Marland Kitchen MILK THISTLE PO Take 1 tablet by mouth daily.  . Multiple Vitamin (MULTIVITAMIN) capsule Take 1 capsule by mouth daily.  . Omega-3 Fatty Acids (FISH OIL PO) Take 1 capsule by mouth daily.  Marland Kitchen OVER THE COUNTER MEDICATION Takes Alphalophoric Acid 1 capsule daily  . predniSONE (DELTASONE) 10 MG tablet TAKE 1 OR 2 TABLETS DAILY AS DIRECTED BY DR.  Marland Kitchen Probiotic Product (PROBIOTIC DAILY) CAPS Take 1 capsule by mouth daily.  . rosuvastatin (CRESTOR) 20 MG tablet Take 1 tablet daily for Cholesterol (Patient taking differently: Take 1/2 tablet on Mondays and Thurdays)  . triamcinolone cream (KENALOG) 0.1 % Apply 1 application topically 3 (three) times daily. (Patient taking differently: Apply 1 application  topically as needed. )  . TURMERIC PO Take 2,000 mg by mouth daily.   . [DISCONTINUED] fenofibrate micronized (LOFIBRA) 134 MG capsule Take 134 mg by mouth daily.   No current facility-administered medications on file prior to visit.    Allergies  Allergen Reactions  . Imuran [Azathioprine] Rash  . Lipitor [Atorvastatin]     Myalgias   . Penicillins     Causes lupus flare ups  . Zocor [Simvastatin]     Myalgias   PMHx:   Past Medical History:  Diagnosis Date  . Asthma   . GERD (gastroesophageal reflux disease)   . Hypertension   . Lupus ([place])   . Prediabetes   . Seasonal allergies   . Vitamin D deficiency    Immunization History   Administered Date(s) Administered  . Influenza Inj Mdck Quad With Preservative 03/04/2019  . Influenza Split 02/26/2013, 02/09/2015  . PPD Test 07/22/2013, 07/23/2014, 07/24/2015, 08/14/2017, 08/27/2018  . Pneumococcal Polysaccharide-23 05/22/2008  . Tdap 07/18/2012   Past Surgical History:  Procedure Laterality Date  . AXILLARY LYMPH NODE BIOPSY Right 08/27/2012   Procedure: AXILLARY LYMPH NODE BIOPSY ;  Surgeon: Rolm Bookbinder, MD;  Location: [facility];  Service: General;  Laterality: Right;  . CHOLECYSTECTOMY N/A 12/28/2012   Procedure: LAPAROSCOPIC CHOLECYSTECTOMY WITH INTRAOPERATIVE CHOLANGIOGRAM;  Surgeon: Joyice Faster. Cornett, MD;  Location: [facility];  Service: General;  Laterality: N/A;  . TONSILLECTOMY    . WISDOM TOOTH EXTRACTION     FHx:    Reviewed / unchanged  SHx:    Reviewed / unchanged   Systems Review:  Constitutional: Denies fever, chills, wt changes, headaches, insomnia, fatigue, night sweats, change in appetite. Eyes: Denies redness, blurred vision, diplopia, discharge, itchy, watery eyes.  ENT: Denies discharge, congestion, post nasal drip, epistaxis, sore throat, earache, hearing loss, dental pain, tinnitus, vertigo, sinus pain, snoring.  CV: Denies chest pain, palpitations, irregular heartbeat, syncope, dyspnea, diaphoresis, orthopnea, PND, claudication or edema. Respiratory: denies cough, dyspnea, DOE, pleurisy, hoarseness, laryngitis, wheezing.  Gastrointestinal: Denies dysphagia, odynophagia, heartburn, reflux, water brash, abdominal pain or cramps, nausea, vomiting, bloating, diarrhea, constipation, hematemesis, melena, hematochezia  or hemorrhoids. Genitourinary: Denies dysuria, frequency, urgency, nocturia, hesitancy, discharge, hematuria or flank pain. Musculoskeletal: Denies arthralgias, myalgias, stiffness, jt. swelling, pain, limping or strain/sprain.  Skin: Denies pruritus, rash, hives, warts, acne, eczema or change in skin lesion(s). Neuro: No weakness,  tremor, incoordination, spasms, paresthesia or pain. Psychiatric: Denies confusion, memory loss or sensory loss. Endo: Denies change in weight, skin or hair change.  Heme/Lymph: No excessive bleeding, bruising or enlarged lymph nodes.  Physical Exam  BP 120/86   Pulse 68   Temp 97.9 F (36.6 C)   Resp 18   Ht 5' 10.5" (1.791 m)   Wt 264 lb 3.2 oz (119.8 kg)   BMI 37.37 kg/m   Appears  well nourished, well groomed  and in no distress.  Eyes: PERRLA, EOMs, conjunctiva no swelling or erythema. Sinuses: No frontal/maxillary tenderness ENT/Mouth: EAC's clear, TM's nl w/o erythema, bulging. Nares clear w/o erythema, swelling, exudates. Oropharynx clear without erythema or exudates. Oral hygiene is good. Tongue normal, non obstructing. Hearing intact.  Neck: Supple. Thyroid not palpable. Car 2+/2+ without bruits, nodes or JVD. Chest: Respirations nl with BS clear & equal w/o rales, rhonchi, wheezing or stridor.  Cor: Heart sounds normal w/ regular rate and rhythm without sig. murmurs, gallops, clicks or rubs. Peripheral pulses normal and equal  without edema.  Abdomen: Soft & bowel sounds normal. Non-tender  w/o guarding, rebound, hernias, masses or organomegaly.  Lymphatics: Unremarkable.  Musculoskeletal: Full ROM all peripheral extremities, joint stability, 5/5 strength and normal gait.  Skin: Warm, dry with Generalized scattered  chafting rashes. Neuro: Cranial nerves intact, reflexes equal bilaterally. Sensory-motor testing grossly intact. Tendon reflexes grossly intact.  Pysch: Alert & oriented x 3.  Insight and judgement nl & appropriate. No ideations.  Assessment and Plan:  1. Essential hypertension  - Continue medication, monitor blood pressure at home.  - Continue DASH diet.  Reminder to go to the ER if any CP,  SOB, nausea, dizziness, severe HA, changes vision/speech.  - CBC with Differential/Platelet - COMPLETE METABOLIC PANEL WITH GFR - Magnesium - TSH  2.  Hyperlipidemia, mixed  - Continue diet/meds, exercise,& lifestyle modifications.  - Continue monitor periodic cholesterol/liver & renal functions   - Lipid panel - TSH  3. Type 2 diabetes mellitus with stage 2 chronic kidney disease,  without long-term current use of insulin ([name])  - Continue diet, exercise  - Lifestyle modifications.  - Monitor appropriate labs.  - Hemoglobin A1c - Insulin, random  4. Vitamin D deficiency  - Continue supplementation.  - VITAMIN D 25 Hydroxyl  5. 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 Medication management  - CBC with Differential/Platelet - COMPLETE METABOLIC PANEL WITH GFR - Magnesium - Lipid panel - TSH - Hemoglobin A1c - Insulin, random - VITAMIN D 25 Hydroxyl - Uric acid       Discussed  regular exercise, BP monitoring, weight control to achieve/maintain BMI less than 25 and discussed med and SE's. Recommended labs to assess and monitor clinical status with further disposition pending results of labs.  I discussed the assessment and treatment plan with the patient. The patient was provided an opportunity to ask questions and all were answered. The patient agreed with the plan and demonstrated an understanding of the instructions.  I provided over 30 minutes of exam, counseling, chart review and  complex critical decision making.  Kirtland Bouchard, MD

## 2019-03-04 ENCOUNTER — Ambulatory Visit (INDEPENDENT_AMBULATORY_CARE_PROVIDER_SITE_OTHER): Payer: BC Managed Care – PPO | Admitting: Internal Medicine

## 2019-03-04 ENCOUNTER — Other Ambulatory Visit: Payer: Self-pay

## 2019-03-04 VITALS — BP 120/86 | HR 68 | Temp 97.9°F | Resp 18 | Ht 70.5 in | Wt 264.2 lb

## 2019-03-04 DIAGNOSIS — I10 Essential (primary) hypertension: Secondary | ICD-10-CM

## 2019-03-04 DIAGNOSIS — Z79899 Other long term (current) drug therapy: Secondary | ICD-10-CM

## 2019-03-04 DIAGNOSIS — Z23 Encounter for immunization: Secondary | ICD-10-CM | POA: Diagnosis not present

## 2019-03-04 DIAGNOSIS — E782 Mixed hyperlipidemia: Secondary | ICD-10-CM | POA: Diagnosis not present

## 2019-03-04 DIAGNOSIS — E559 Vitamin D deficiency, unspecified: Secondary | ICD-10-CM

## 2019-03-04 DIAGNOSIS — E1122 Type 2 diabetes mellitus with diabetic chronic kidney disease: Secondary | ICD-10-CM

## 2019-03-04 DIAGNOSIS — M1000 Idiopathic gout, unspecified site: Secondary | ICD-10-CM

## 2019-03-04 DIAGNOSIS — M3219 Other organ or system involvement in systemic lupus erythematosus: Secondary | ICD-10-CM

## 2019-03-04 DIAGNOSIS — E291 Testicular hypofunction: Secondary | ICD-10-CM | POA: Diagnosis not present

## 2019-03-04 DIAGNOSIS — N182 Chronic kidney disease, stage 2 (mild): Secondary | ICD-10-CM

## 2019-03-04 DIAGNOSIS — I7789 Other specified disorders of arteries and arterioles: Secondary | ICD-10-CM

## 2019-03-05 LAB — COMPLETE METABOLIC PANEL WITH GFR
AG Ratio: 1.6 (calc) (ref 1.0–2.5)
ALT: 45 U/L (ref 9–46)
AST: 46 U/L — ABNORMAL HIGH (ref 10–35)
Albumin: 4.1 g/dL (ref 3.6–5.1)
Alkaline phosphatase (APISO): 46 U/L (ref 35–144)
BUN: 13 mg/dL (ref 7–25)
CO2: 29 mmol/L (ref 20–32)
Calcium: 9.1 mg/dL (ref 8.6–10.3)
Chloride: 102 mmol/L (ref 98–110)
Creat: 0.96 mg/dL (ref 0.70–1.25)
GFR, Est African American: 98 mL/min/{1.73_m2} (ref >=60)
GFR, Est Non African American: 84 mL/min/{1.73_m2} (ref >=60)
Globulin: 2.6 g/dL (calc) (ref 1.9–3.7)
Glucose, Bld: 112 mg/dL — ABNORMAL HIGH (ref 65–99)
Potassium: 4.4 mmol/L (ref 3.5–5.3)
Sodium: 139 mmol/L (ref 135–146)
Total Bilirubin: 0.5 mg/dL (ref 0.2–1.2)
Total Protein: 6.7 g/dL (ref 6.1–8.1)

## 2019-03-05 LAB — CBC WITH DIFFERENTIAL/PLATELET
Absolute Monocytes: 599 cells/uL (ref 200–950)
Basophils Absolute: 29 cells/uL (ref 0–200)
Basophils Relative: 0.4 %
Eosinophils Absolute: 131 cells/uL (ref 15–500)
Eosinophils Relative: 1.8 %
HCT: 39.9 % (ref 38.5–50.0)
Hemoglobin: 13.5 g/dL (ref 13.2–17.1)
Lymphs Abs: 1153 cells/uL (ref 850–3900)
MCH: 32.1 pg (ref 27.0–33.0)
MCHC: 33.8 g/dL (ref 32.0–36.0)
MCV: 95 fL (ref 80.0–100.0)
MPV: 10.9 fL (ref 7.5–12.5)
Monocytes Relative: 8.2 %
Neutro Abs: 5387 cells/uL (ref 1500–7800)
Neutrophils Relative %: 73.8 %
Platelets: 279 10*3/uL (ref 140–400)
RBC: 4.2 10*6/uL (ref 4.20–5.80)
RDW: 12.8 % (ref 11.0–15.0)
Total Lymphocyte: 15.8 %
WBC: 7.3 10*3/uL (ref 3.8–10.8)

## 2019-03-05 LAB — LIPID PANEL
Cholesterol: 205 mg/dL — ABNORMAL HIGH (ref <200)
HDL: 29 mg/dL — ABNORMAL LOW (ref >=40)
LDL Cholesterol (Calc): 146 mg/dL (calc) — ABNORMAL HIGH
Non-HDL Cholesterol (Calc): 176 mg/dL (calc) — ABNORMAL HIGH (ref <130)
Total CHOL/HDL Ratio: 7.1 (calc) — ABNORMAL HIGH (ref <5.0)
Triglycerides: 164 mg/dL — ABNORMAL HIGH (ref <150)

## 2019-03-05 LAB — URIC ACID: Uric Acid, Serum: 5 mg/dL (ref 4.0–8.0)

## 2019-03-05 LAB — HEMOGLOBIN A1C
Hgb A1c MFr Bld: 6.1 % of total Hgb — ABNORMAL HIGH (ref <5.7)
Mean Plasma Glucose: 128 (calc)
eAG (mmol/L): 7.1 (calc)

## 2019-03-05 LAB — TSH: TSH: 1.41 mIU/L (ref 0.40–4.50)

## 2019-03-05 LAB — VITAMIN D 25 HYDROXY (VIT D DEFICIENCY, FRACTURES): Vit D, 25-Hydroxy: 57 ng/mL (ref 30–100)

## 2019-03-05 LAB — MAGNESIUM: Magnesium: 1.8 mg/dL (ref 1.5–2.5)

## 2019-03-05 LAB — INSULIN, RANDOM: Insulin: 14.1 u[IU]/mL

## 2019-05-20 ENCOUNTER — Telehealth: Payer: Self-pay | Admitting: *Deleted

## 2019-05-20 NOTE — Telephone Encounter (Signed)
Patient called and reported a positive Covid 19 and asked if he needed to do anything different die to having Lupus. Per Dr Melford Aase, the patient can contiue the Mucinex and be sure to take Vitamin D, Vitamin C 1000 mg twice a day and zinc 50 mg.  Patient is aware.

## 2019-05-24 ENCOUNTER — Other Ambulatory Visit: Payer: Self-pay | Admitting: Adult Health

## 2019-05-27 ENCOUNTER — Other Ambulatory Visit: Payer: Self-pay | Admitting: Internal Medicine

## 2019-06-03 NOTE — Progress Notes (Signed)
3 MONTH FOLLOW UP  Assessment and Plan:    Sean Maldonado was seen today for follow-up and other.  Diagnoses and all orders for this visit:  Essential hypertension Continue current medications: Monitor blood pressure at home; call if consistently over 130/80 Continue DASH diet.   Reminder to go to the ER if any CP, SOB, nausea, dizziness, severe HA, changes vision/speech, left arm numbness and tingling and jaw pain. -     CBC with Differential/Platelet -     COMPLETE METABOLIC PANEL WITH GFR -     Magnesium  Hyperlipidemia, mixed Continue medications: Discussed dietary and exercise modifications Low fat diet -     Lipid panel  Type 2 diabetes mellitus with stage 2 chronic kidney disease, without long-term current use of insulin ([name]) Controlled with diet and exercise Discussed general issues about diabetes pathophysiology and management. Education: Reviewed 'ABCs' of diabetes management (respective goals in parentheses):  A1C (<7), blood pressure (<130/80), and cholesterol (LDL <70) Dietary recommendations Encouraged aerobic exercise.  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  Gastroesophageal reflux disease, unspecified whether esophagitis present Doing well at this time Continue:  Diet discussed Monitor for triggers Avoid food with high acid content Avoid excessive cafeine Increase water intake  NASH (nonalcoholic steatohepatitis) Discussed dietary and exercise  modifications  BMI 36.0-36.9 Discussed dietary and exercise modifications  CKD stage 2 due to type 2 diabetes mellitus ([name]) Increase fluids  Avoid NSAIDS Blood pressure control Monitor sugars  Will continue to monitor  Mild intermittent asthma without complication Doing well, no medications at this time.  Medication management Continued  COVID-19 Positive 05/19/18 Temp elevated today Denies any symptoms  Continue diet and meds as discussed. Further disposition pending results of labs. Discussed med's effects and SE's.   Over 30 minutes of onterview, exam, counseling, chart review, and critical decision making was performed.   Future Appointments  Date Time Provider DHowells 09/04/2019  2:00 PM MUnk Pinto MD [facility] None    ----------------------------------------------------------------------------------------------------------------------  HPI 64y.o. male  presents for 3 month follow up on HTN, HLD, DMII, obesity, gout and vitamin D deficiency.   Patient was diagnosed with cutaneous Lupus in 2017 and is followed at [facility]  Last fall (Aug 2018), patient had a severe intolerant or allergic reaction to Imuran. Since then his cutaneous Lupus symptoms seem reasonablycontrolled on Prednisone currently tapering, taking 581mdaily. Next follow up with rheumatology in March.  Reports that he had COVID 05/20/19.  He took a round of prednisone, sudafed and mucinex. Reports that his best friend passed away from [place] days ago.  He reports he has been very upset.  Reports he lives alone but he has family friends to talk with regarding this.  He is on celexa and rare PRN xanax for anxiety.   BMI is Body mass index is 36.27 kg/m., he has been working on exercise, admits to poor diet, has cut out sodas and has lost 12lbas. Wt Readings from Last 3 Encounters:  06/04/19 256 lb 6.4  oz (116.3 kg)  03/04/19 264 lb 3.2 oz (119.8 kg)  11/27/18 264 lb (119.7  kg)    HTN predated 2004. His blood pressure has been controlled at home (120s/80s), today their BP is BP: 124/76 Admits he didn't take medication until just a short while ago.   He does workout. He denies chest pain, shortness of breath, dizziness.    He is on cholesterol medication (cannot tolerate lipitor, zocor, insignificant progress with zetia, fenofibrate, now on rosuvastatin 10 mg twice weekly and tolerating well ) and denies myalgias. His cholesterol is not at goal. The cholesterol last visit was:    Lab Results  Component Value Date   CHOL 205 (H) 03/04/2019   HDL 29 (L) 03/04/2019   LDLCALC 146 (H) 03/04/2019   TRIG 164 (H) 03/04/2019   CHOLHDL 7.1 (H) 03/04/2019     He has been working on diet and exercise for prediabetes/recently progressed to diabetes, currently managed by lifestyle, and denies foot ulcerations, increased appetite, nausea, polydipsia, polyuria, visual disturbances, vomiting and weight loss. Last A1C in the office was:  Lab Results  Component Value Date   HGBA1C 6.1 (H) 03/04/2019   Patient is on Vitamin D supplement for deficiency (42, 2008) Lab Results  Component Value Date   VD25OH 57 03/04/2019     Patient has hx of gout though has stopped taking allopurinol, controlled on low dose prednisone, and does not report a recent flare.  Lab Results  Component Value Date   LABURIC 5.0 03/04/2019      Current Medications:  Current Outpatient Medications on File Prior to Visit  Medication Sig  . allopurinol (ZYLOPRIM) 300 MG tablet Take 1 tablet Daily to Prevent Gout  . ALPRAZolam (XANAX) 1 MG tablet Take 1/2-1 tablet 2 - 3 x /day ONLY if needed for Anxiety Attack &  limit to 5 days /week to avoid addiction  . aspirin EC 81 MG tablet Take 81 mg by mouth.  Marland Kitchen atenolol (TENORMIN) 100 MG tablet Take 1 tablet Daily for BP  . celecoxib (CELEBREX) 200 MG capsule Take 1 capsule daily for Pain & Inflammation  . Cholecalciferol (VITAMIN D3) 5000 UNITS CAPS Take  1 capsule by mouth daily.  Marland Kitchen ezetimibe (ZETIA) 10 MG tablet TAKE 1 TABLET DAILY FOR CHOLESTEROL  . gabapentin (NEURONTIN) 100 MG capsule Take 1 capsule 2 to 3 x /day as needed for Neuropathy Pain  . hydrOXYzine (ATARAX/VISTARIL) 25 MG tablet TAKE 2 TABLETS THREE TIMES DAILY AS NEEDED FOR ITCHING.  Marland Kitchen lisinopril-hydrochlorothiazide (ZESTORETIC) 20-25 MG tablet TAKE 1 TABLET ONCE DAILY.  . Magnesium 400 MG TABS Take 1 tablet by mouth daily.  Marland Kitchen MILK THISTLE PO Take 1 tablet by mouth daily.  . Multiple Vitamin (MULTIVITAMIN) capsule Take 1 capsule by mouth daily.  . Omega-3 Fatty Acids (FISH OIL PO) Take 1 capsule by mouth daily.  Marland Kitchen OVER THE COUNTER MEDICATION Takes Alphalophoric Acid 1 capsule daily  . predniSONE (DELTASONE) 10 MG tablet TAKE 1 OR 2 TABLETS DAILY AS DIRECTED BY DR.  Marland Kitchen Probiotic Product (PROBIOTIC DAILY) CAPS Take 1 capsule by mouth daily.  . rosuvastatin (CRESTOR) 20 MG tablet Take 1 tablet daily for Cholesterol (Patient taking differently: Take 1/2 tablet on Mondays and Thurdays)  . triamcinolone cream (KENALOG) 0.1 % Apply 1 application topically 3 (three) times daily. (Patient taking differently: Apply 1 application topically as needed. )  . TURMERIC PO Take 2,000 mg by mouth daily.    No current facility-administered medications on file prior  to visit.     Allergies:  Allergies  Allergen Reactions  . Imuran [Azathioprine] Rash  . Lipitor [Atorvastatin]     Myalgias   . Penicillins     Causes lupus flare ups  . Zocor [Simvastatin]     Myalgias     Medical History:  Past Medical History:  Diagnosis Date  . Asthma   . GERD (gastroesophageal reflux disease)   . Hypertension   . Lupus ([place])   . Prediabetes   . Seasonal allergies   . Vitamin D deficiency    Family history- Reviewed and unchanged Social history- Reviewed and unchanged   Review of Systems:  Review of Systems  Constitutional: Negative for malaise/fatigue and weight loss.  HENT: Negative for  hearing loss and tinnitus.   Eyes: Negative for blurred vision and double vision.  Respiratory: Negative for cough, shortness of breath and wheezing.   Cardiovascular: Negative for chest pain, palpitations, orthopnea, claudication and leg swelling.  Gastrointestinal: Negative for abdominal pain, blood in stool, constipation, diarrhea, heartburn, melena, nausea and vomiting.  Genitourinary: Negative.   Musculoskeletal: Negative for joint pain and myalgias.  Skin: Negative for rash.  Neurological: Positive for tingling (bilateral feet). Negative for dizziness, sensory change, weakness and headaches.  Endo/Heme/Allergies: Negative for polydipsia.  Psychiatric/Behavioral: Negative.   All other systems reviewed and are negative.   Physical Exam: BP 124/76   Pulse 60   Temp (!) 101.1 F (38.4 C)   Wt 256 lb 6.4 oz (116.3 kg)   SpO2 96%   BMI 36.27 kg/m  Wt Readings from Last 3 Encounters:  06/04/19 256 lb 6.4 oz (116.3 kg)  03/04/19 264 lb 3.2 oz (119.8 kg)  11/27/18 264 lb (119.7 kg)     General Appearance: Well nourished, in no apparent distress. Eyes: PERRLA, EOMs, conjunctiva no swelling or erythema Sinuses: No Frontal/maxillary tenderness ENT/Mouth: Ext aud canals clear, TMs without erythema, bulging. Hearing normal.  Neck: Supple, thyroid normal.  Respiratory: Respiratory effort normal, BS equal bilaterally without rales, rhonchi, wheezing or stridor.  Cardio: RRR with no MRGs. Brisk peripheral pulses without edema.  Abdomen: Soft, + BS.  Non tender, no guarding, rebound, hernias, masses. Lymphatics: Non tender without lymphadenopathy.  Musculoskeletal: Full ROM, 5/5 strength, Normal gait Skin: Warm, dry; Scattered erythematous to velvety pink to violaceous circumscribed lesions occasionally excoriated and some almost plaque-like over the scalp, trunk & extremities.  Neuro: Cranial nerves intact. No cerebellar symptoms.  Psych: Awake and oriented X 3, normal affect, Insight  and Judgment appropriate.    Sean Sierras, NP 12:30 PM [facility]

## 2019-06-04 ENCOUNTER — Ambulatory Visit (INDEPENDENT_AMBULATORY_CARE_PROVIDER_SITE_OTHER): Payer: BC Managed Care – PPO | Admitting: Adult Health Nurse Practitioner

## 2019-06-04 ENCOUNTER — Encounter: Payer: Self-pay | Admitting: Adult Health Nurse Practitioner

## 2019-06-04 ENCOUNTER — Other Ambulatory Visit: Payer: Self-pay

## 2019-06-04 VITALS — BP 124/76 | HR 60 | Temp 101.1°F | Wt 256.4 lb

## 2019-06-04 DIAGNOSIS — Z79899 Other long term (current) drug therapy: Secondary | ICD-10-CM

## 2019-06-04 DIAGNOSIS — Z6836 Body mass index (BMI) 36.0-36.9, adult: Secondary | ICD-10-CM

## 2019-06-04 DIAGNOSIS — J4520 Mild intermittent asthma, uncomplicated: Secondary | ICD-10-CM

## 2019-06-04 DIAGNOSIS — M3219 Other organ or system involvement in systemic lupus erythematosus: Secondary | ICD-10-CM

## 2019-06-04 DIAGNOSIS — E559 Vitamin D deficiency, unspecified: Secondary | ICD-10-CM

## 2019-06-04 DIAGNOSIS — N182 Chronic kidney disease, stage 2 (mild): Secondary | ICD-10-CM

## 2019-06-04 DIAGNOSIS — K7581 Nonalcoholic steatohepatitis (NASH): Secondary | ICD-10-CM

## 2019-06-04 DIAGNOSIS — K219 Gastro-esophageal reflux disease without esophagitis: Secondary | ICD-10-CM

## 2019-06-04 DIAGNOSIS — I10 Essential (primary) hypertension: Secondary | ICD-10-CM

## 2019-06-04 DIAGNOSIS — E782 Mixed hyperlipidemia: Secondary | ICD-10-CM

## 2019-06-04 DIAGNOSIS — Z8616 Personal history of COVID-19: Secondary | ICD-10-CM

## 2019-06-04 DIAGNOSIS — U071 COVID-19: Secondary | ICD-10-CM

## 2019-06-04 DIAGNOSIS — M1000 Idiopathic gout, unspecified site: Secondary | ICD-10-CM

## 2019-06-04 DIAGNOSIS — E1122 Type 2 diabetes mellitus with diabetic chronic kidney disease: Secondary | ICD-10-CM

## 2019-06-04 DIAGNOSIS — I7789 Other specified disorders of arteries and arterioles: Secondary | ICD-10-CM

## 2019-06-04 HISTORY — DX: Personal history of COVID-19: Z86.16

## 2019-06-04 NOTE — Patient Instructions (Addendum)
We will contact you in 1-3 days with your lab results.   Changing the timing of your cholesterol medication Rosuvastatin (Crestor) 32m to night.  See if this help with your diarrhea symptoms.    Continue the good work with weight loss.  Increase activity and be sure to drink 64-80oz of water a day.    Vit D  & Vit C 1,000 mg   are recommended to help protect  against the Covid-19 and other Corona viruses.    Also it's recommended  to take  Zinc 50 mg  to help  protect against the Covid-19   and best place to get  is also on [employer]  and don't pay more than 6-8 cents /pill !  =============================== Coronavirus (COVID-19) Are you at risk?  Are you at risk for the Coronavirus (COVID-19)?  To be considered HIGH RISK for Coronavirus (COVID-19), you have to meet the following criteria:  . [place] or in [place] or [place] and have fever, cough, and shortness of breath within the last 2 weeks of travel OR . Been in close contact with a person diagnosed with COVID-19 within the last 2 weeks and have  . fever, cough,and shortness of breath .  . IF YOU DO NOT MEET THESE CRITERIA, YOU ARE CONSIDERED LOW RISK FOR COVID-19.  What to do if you are HIGH RISK for COVID-19?  .Marland KitchenIf you are having a medical emergency, call 911. . Seek medical care right away. Before you go to a doctor's office, urgent care or emergency department, .  call ahead and tell them about your recent travel, contact with someone diagnosed with COVID-19  .  and your symptoms.  . You should receive instructions from your physician's office regarding next steps of care.  . When you arrive at healthcare provider, tell the healthcare staff immediately you have returned from  . visiting [place] or traveled in the [place]  . [place] in the last two weeks or you have been in close contact with a person diagnosed with  . COVID-19 in the last 2 weeks.   . Tell the health care staff about your symptoms: fever, cough and shortness of breath. . After you have been seen by a medical provider, you will be either: o Tested for (COVID-19) and discharged home on quarantine except to seek medical care if  o symptoms worsen, and asked to  - Stay home and avoid contact with others until you get your results (4-5 days)  - Avoid travel on public transportation if possible (such as bus, train, or airplane) or o Sent to the Emergency Department by EMS for evaluation, COVID-19 testing  and  o possible admission depending on your condition and test results.  What to do if you are LOW RISK for COVID-19?  Reduce your risk of any infection by using the same precautions used for avoiding the common cold or flu:  .Marland KitchenWash your hands often with soap and warm water for at least 20 seconds.  If soap and water are not readily available,  . use an alcohol-based hand sanitizer with at least 60% alcohol.  . If coughing or sneezing, cover your mouth and nose by coughing or sneezing into the elbow areas of your shirt or coat, .  into a tissue or  into your sleeve (not your hands). . Avoid shaking hands with others and consider head nods or verbal greetings only. . Avoid touching your eyes, nose, or mouth with unwashed hands.  . Avoid close contact with people who are sick. . Avoid places or events with large numbers of people in one location, like concerts or sporting events. . Carefully consider travel plans you have or are making. . If you are planning any travel outside or inside the [place], visit the CDC's Travelers' Health webpage for the latest health notices. . If you have some symptoms but not all symptoms, continue to monitor at home and seek medical attention  . if your symptoms worsen. . If you are having a medical emergency, call  911. >>>>>>>>>>>>>>>>>>>>>>>>>>>> Preventive Care for Adults  A healthy lifestyle and preventive care can promote health and wellness. Preventive health guidelines for men include the following key practices:  A routine yearly physical is a good way to check with your health care provider about your health and preventative screening. It is a chance to share any concerns and updates on your health and to receive a thorough exam.  Visit your dentist for a routine exam and preventative care every 6 months. Brush your teeth twice a day and floss once a day. Good oral hygiene prevents tooth decay and gum disease.  The frequency of eye exams is based on your age, health, family medical history, use of contact lenses, and other factors. Follow your health care provider's recommendations for frequency of eye exams.  Eat a healthy diet. Foods such as vegetables, fruits, whole grains, low-fat dairy products, and lean protein foods contain the nutrients you need without too many calories. Decrease your intake of foods high in solid fats, added sugars, and salt. Eat the right amount of calories for you. Get information about a proper diet from your health care provider, if necessary.  Regular physical exercise is one of the most important things you can do for your health. Most adults should get at least 150 minutes of moderate-intensity exercise (any activity that increases your heart rate and causes you to sweat) each week. In addition, most adults need muscle-strengthening exercises on 2 or more days a week.  Maintain a healthy weight. The body mass index (BMI) is a screening tool to identify possible weight problems. It provides an estimate of body fat based on height and weight. Your health care provider can find your BMI and can help you achieve or maintain a healthy weight. For adults 20 years and older:  A BMI below 18.5 is considered underweight.  A BMI of 18.5 to 24.9 is normal.  A BMI of 25 to  29.9 is considered overweight.  A BMI of 30 and above is considered obese.  Maintain normal blood lipids and cholesterol levels by exercising and minimizing your intake of saturated fat. Eat a balanced diet with plenty of fruit and vegetables. Blood tests for lipids and cholesterol should begin at age 65 and be repeated every 5 years. If your lipid or cholesterol levels are high, you are over 50, or you are at high risk for heart disease, you may need your cholesterol levels checked more frequently. Ongoing high lipid and cholesterol levels should be treated with medicines if diet and exercise are not working.  If you smoke, find out from your health care provider how to quit. If you do not use tobacco, do not start.  Lung cancer screening is recommended for adults aged 41-80 years  who are at high risk for developing lung cancer because of a history of smoking. A yearly low-dose CT scan of the lungs is recommended for people who have at least a 30-pack-year history of smoking and are a current smoker or have quit within the past 15 years. A pack year of smoking is smoking an average of 1 pack of cigarettes a day for 1 year (for example: 1 pack a day for 30 years or 2 packs a day for 15 years). Yearly screening should continue until the smoker has stopped smoking for at least 15 years. Yearly screening should be stopped for people who develop a health problem that would prevent them from having lung cancer treatment.  If you choose to drink alcohol, do not have more than 2 drinks per day. One drink is considered to be 12 ounces (355 mL) of beer, 5 ounces (148 mL) of wine, or 1.5 ounces (44 mL) of liquor.  Avoid use of street drugs. Do not share needles with anyone. Ask for help if you need support or instructions about stopping the use of drugs.  High blood pressure causes heart disease and increases the risk of stroke. Your blood pressure should be checked at least every 1-2 years. Ongoing high blood  pressure should be treated with medicines, if weight loss and exercise are not effective.  If you are 82-37 years old, ask your health care provider if you should take aspirin to prevent heart disease.  Diabetes screening involves taking a blood sample to check your fasting blood sugar level. This should be done once every 3 years, after age 93, if you are within normal weight and without risk factors for diabetes. Testing should be considered at a younger age or be carried out more frequently if you are overweight and have at least 1 risk factor for diabetes.  Colorectal cancer can be detected and often prevented. Most routine colorectal cancer screening begins at the age of 9 and continues through age 35. However, your health care provider may recommend screening at an earlier age if you have risk factors for colon cancer. On a yearly basis, your health care provider may provide home test kits to check for hidden blood in the stool. Use of a small camera at the end of a tube to directly examine the colon (sigmoidoscopy or colonoscopy) can detect the earliest forms of colorectal cancer. Talk to your health care provider about this at age 74, when routine screening begins. Direct exam of the colon should be repeated every 5-10 years through age 62, unless early forms of precancerous polyps or small growths are found.   Talk with your health care provider about prostate cancer screening.  Testicular cancer screening isrecommended for adult males. Screening includes self-exam, a health care provider exam, and other screening tests. Consult with your health care provider about any symptoms you have or any concerns you have about testicular cancer.  Use sunscreen. Apply sunscreen liberally and repeatedly throughout the day. You should seek shade when your shadow is shorter than you. Protect yourself by wearing long sleeves, pants, a wide-brimmed hat, and sunglasses year round, whenever you are  outdoors.  Once a month, do a whole-body skin exam, using a mirror to look at the skin on your back. Tell your health care provider about new moles, moles that have irregular borders, moles that are larger than a pencil eraser, or moles that have changed in shape or color.  Stay current with required vaccines (immunizations).  Influenza vaccine. All adults should be immunized every year.  Tetanus, diphtheria, and acellular pertussis (Td, Tdap) vaccine. An adult who has not previously received Tdap or who does not know his vaccine status should receive 1 dose of Tdap. This initial dose should be followed by tetanus and diphtheria toxoids (Td) booster doses every 10 years. Adults with an unknown or incomplete history of completing a 3-dose immunization series with Td-containing vaccines should begin or complete a primary immunization series including a Tdap dose. Adults should receive a Td booster every 10 years.  Varicella vaccine. An adult without evidence of immunity to varicella should receive 2 doses or a second dose if he has previously received 1 dose.  Human papillomavirus (HPV) vaccine. Males aged 15-21 years who have not received the vaccine previously should receive the 3-dose series. Males aged 22-26 years may be immunized. Immunization is recommended through the age of 22 years for any male who has sex with males and did not get any or all doses earlier. Immunization is recommended for any person with an immunocompromised condition through the age of 68 years if he did not get any or all doses earlier. During the 3-dose series, the second dose should be obtained 4-8 weeks after the first dose. The third dose should be obtained 24 weeks after the first dose and 16 weeks after the second dose.  Zoster vaccine. One dose is recommended for adults aged 62 years or older unless certain conditions are present.    PREVNAR  - Pneumococcal 13-valent conjugate (PCV13) vaccine. When indicated, a  person who is uncertain of his immunization history and has no record of immunization should receive the PCV13 vaccine. An adult aged 26 years or older who has certain medical conditions and has not been previously immunized should receive 1 dose of PCV13 vaccine. This PCV13 should be followed with a dose of pneumococcal polysaccharide (PPSV23) vaccine. The PPSV23 vaccine dose should be obtained at least 1 r more year(s) after the dose of PCV13 vaccine. An adult aged 21 years or older who has certain medical conditions and previously received 1 or more doses of PPSV23 vaccine should receive 1 dose of PCV13. The PCV13 vaccine dose should be obtained 1 or more years after the last PPSV23 vaccine dose.    PNEUMOVAX - Pneumococcal polysaccharide (PPSV23) vaccine. When PCV13 is also indicated, PCV13 should be obtained first. All adults aged 33 years and older should be immunized. An adult younger than age 61 years who has certain medical conditions should be immunized. Any person who resides in a nursing home or long-term care facility should be immunized. An adult smoker should be immunized. People with an immunocompromised condition and certain other conditions should receive both PCV13 and PPSV23 vaccines. People with human immunodeficiency virus (HIV) infection should be immunized as soon as possible after diagnosis. Immunization during chemotherapy or radiation therapy should be avoided. Routine use of PPSV23 vaccine is not recommended for American Indians, [place], or people younger than 65 years unless there are medical conditions that require PPSV23 vaccine. When indicated, people who have unknown immunization and have no record of immunization should receive PPSV23 vaccine. One-time revaccination 5 years after the first dose of PPSV23 is recommended for people aged 19-64 years who have chronic kidney failure, nephrotic syndrome, asplenia, or immunocompromised conditions. People who received 1-2 doses  of PPSV23 before age 76 years should receive another dose of PPSV23 vaccine at age 23 years or later if at least 5 years have  passed since the previous dose. Doses of PPSV23 are not needed for people immunized with PPSV23 at or after age 36 years.    Hepatitis A vaccine. Adults who wish to be protected from this disease, have certain high-risk conditions, work with hepatitis A-infected animals, work in hepatitis A research labs, or travel to or work in countries with a high rate of hepatitis A should be immunized. Adults who were previously unvaccinated and who anticipate close contact with an international adoptee during the first 60 days after arrival in the [place] from a country with a high rate of hepatitis A should be immunized.    Hepatitis B vaccine. Adults should be immunized if they wish to be protected from this disease, have certain high-risk conditions, may be exposed to blood or other infectious body fluids, are household contacts or sex partners of hepatitis B positive people, are clients or workers in certain care facilities, or travel to or work in countries with a high rate of hepatitis B.   Preventive Service / Frequency   Ages 38 to 35  Blood pressure check.  Lipid and cholesterol check  Lung cancer screening. / Every year if you are aged 36-80 years and have a 30-pack-year history of smoking and currently smoke or have quit within the past 15 years. Yearly screening is stopped once you have quit smoking for at least 15 years or develop a health problem that would prevent you from having lung cancer treatment.  Fecal occult blood test (FOBT) of stool. / Every year beginning at age 52 and continuing until age 74. You may not have to do this test if you get a colonoscopy every 10 years.  Flexible sigmoidoscopy** or colonoscopy.** / Every 5 years for a flexible sigmoidoscopy or every 10 years for a colonoscopy beginning at age 22 and continuing until age 2. Screening  for abdominal aortic aneurysm (AAA)  by ultrasound is recommended for people who have history of high blood pressure or who are current or former smokers. +++++++++++ Recommend Adult Low Dose Aspirin or  coated  Aspirin 81 mg daily  To reduce risk of Colon Cancer 40 %,  Skin Cancer 26 % ,  Malignant Melanoma 46%  and  Pancreatic cancer 60% ++++++++++++++++++++ Vitamin D goal  is between 70-100.  Please make sure that you are taking your Vitamin D as directed.  It is very important as a natural anti-inflammatory  helping hair, skin, and nails, as well as reducing stroke and heart attack risk.  It helps your bones and helps with mood. It also decreases numerous cancer risks so please take it as directed.  Low Vit D is associated with a 200-300% higher risk for CANCER  and 200-300% higher risk for HEART   ATTACK  &  STROKE.   .....................................Marland Kitchen It is also associated with higher death rate at younger ages,  autoimmune diseases like Rheumatoid arthritis, Lupus, Multiple Sclerosis.    Also many other serious conditions, like depression, Alzheimer's Dementia, infertility, muscle aches, fatigue, fibromyalgia - just to name a few. +++++++++++++++++++++ Recommend the book "The END of DIETING" by Dr Excell Seltzer  & the book "The END of DIABETES " by Dr Excell Seltzer At [email or web address] - get book & Audio CD's    Being diabetic has a  300% increased risk for heart attack, stroke, cancer, and alzheimer- type vascular dementia. It is very important that you work harder with diet by avoiding all foods that are white. Avoid white rice (brown &  wild rice is OK), white potatoes (sweetpotatoes in moderation is OK), White bread or wheat bread or anything made out of white flour like bagels, donuts, rolls, buns, biscuits, cakes, pastries, cookies, pizza crust, and pasta (made from white flour & egg whites) - vegetarian pasta or spinach or wheat pasta is OK. Multigrain breads like Arnold's or  [employer], or multigrain sandwich thins or flatbreads.  Diet, exercise and weight loss can reverse and cure diabetes in the early stages.  Diet, exercise and weight loss is very important in the control and prevention of complications of diabetes which affects every system in your body, ie. Brain - dementia/stroke, eyes - glaucoma/blindness, heart - heart attack/heart failure, kidneys - dialysis, stomach - gastric paralysis, intestines - malabsorption, nerves - severe painful neuritis, circulation - gangrene & loss of a leg(s), and finally cancer and Alzheimers.    I recommend avoid fried & greasy foods,  sweets/candy, white rice (brown or wild rice or Quinoa is OK), white potatoes (sweet potatoes are OK) - anything made from white flour - bagels, doughnuts, rolls, buns, biscuits,white and wheat breads, pizza crust and traditional pasta made of white flour & egg white(vegetarian pasta or spinach or wheat pasta is OK).  Multi-grain bread is OK - like multi-grain flat bread or sandwich thins. Avoid alcohol in excess. Exercise is also important.    Eat all the vegetables you want - avoid meat, especially red meat and dairy - especially cheese.  Cheese is the most concentrated form of trans-fats which is the worst thing to clog up our arteries. Veggie cheese is OK which can be found in the fresh produce section at [employer] or [employer] or [employer]  ++++++++++++++++++++++ DASH Eating Plan  DASH stands for "Dietary Approaches to Stop Hypertension."   The DASH eating plan is a healthy eating plan that has been shown to reduce high blood pressure (hypertension). Additional health benefits may include reducing the risk of type 2 diabetes mellitus, heart disease, and stroke. The DASH eating plan may also help with weight loss. WHAT DO I NEED TO KNOW ABOUT THE DASH EATING PLAN? For the DASH eating plan, you will follow these general guidelines:  Choose foods with a percent daily value for sodium of  less than 5% (as listed on the food label).  Use salt-free seasonings or herbs instead of table salt or sea salt.  Check with your health care provider or pharmacist before using salt substitutes.  Eat lower-sodium products, often labeled as "lower sodium" or "no salt added."  Eat fresh foods.  Eat more vegetables, fruits, and low-fat dairy products.  Choose whole grains. Look for the word "whole" as the first word in the ingredient list.  Choose fish   Limit sweets, desserts, sugars, and sugary drinks.  Choose heart-healthy fats.  Eat veggie cheese   Eat more home-cooked food and less restaurant, buffet, and fast food.  Limit fried foods.  Cook foods using methods other than frying.  Limit canned vegetables. If you do use them, rinse them well to decrease the sodium.  When eating at a restaurant, ask that your food be prepared with less salt, or no salt if possible.                      WHAT FOODS CAN I EAT? Read Dr Fara Olden Fuhrman's books on The End of Dieting & The End of Diabetes  Grains Whole grain or whole wheat bread. Brown rice. Whole grain or whole  wheat pasta. Quinoa, bulgur, and whole grain cereals. Low-sodium cereals. Corn or whole wheat flour tortillas. Whole grain cornbread. Whole grain crackers. Low-sodium crackers.  Vegetables Fresh or frozen vegetables (raw, steamed, roasted, or grilled). Low-sodium or reduced-sodium tomato and vegetable juices. Low-sodium or reduced-sodium tomato sauce and paste. Low-sodium or reduced-sodium canned vegetables.   Fruits All fresh, canned (in natural juice), or frozen fruits.  Protein Products  All fish and seafood.  Dried beans, peas, or lentils. Unsalted nuts and seeds. Unsalted canned beans.  Dairy Low-fat dairy products, such as skim or 1% milk, 2% or reduced-fat cheeses, low-fat ricotta or cottage cheese, or plain low-fat yogurt. Low-sodium or reduced-sodium cheeses.  Fats and Oils Tub margarines without trans  fats. Light or reduced-fat mayonnaise and salad dressings (reduced sodium). Avocado. Safflower, olive, or canola oils. Natural peanut or almond butter.  Other Unsalted popcorn and pretzels. The items listed above may not be a complete list of recommended foods or beverages. Contact your dietitian for more options.  +++++++++++++++++++  WHAT FOODS ARE NOT RECOMMENDED? Grains/ White flour or wheat flour White bread. White pasta. White rice. Refined cornbread. Bagels and croissants. Crackers that contain trans fat.  Vegetables  Creamed or fried vegetables. Vegetables in a . Regular canned vegetables. Regular canned tomato sauce and paste. Regular tomato and vegetable juices.  Fruits Dried fruits. Canned fruit in light or heavy syrup. Fruit juice.  Meat and Other Protein Products Meat in general - RED meat & White meat.  Fatty cuts of meat. Ribs, chicken wings, all processed meats as bacon, sausage, bologna, salami, fatback, hot dogs, bratwurst and packaged luncheon meats.  Dairy Whole or 2% milk, cream, half-and-half, and cream cheese. Whole-fat or sweetened yogurt. Full-fat cheeses or blue cheese. Non-dairy creamers and whipped toppings. Processed cheese, cheese spreads, or cheese curds.  Condiments Onion and garlic salt, seasoned salt, table salt, and sea salt. Canned and packaged gravies. Worcestershire sauce. Tartar sauce. Barbecue sauce. Teriyaki sauce. Soy sauce, including reduced sodium. Steak sauce. Fish sauce. Oyster sauce. Cocktail sauce. Horseradish. Ketchup and mustard. Meat flavorings and tenderizers. Bouillon cubes. Hot sauce. Tabasco sauce. Marinades. Taco seasonings. Relishes.  Fats and Oils Butter, stick margarine, lard, shortening and bacon fat. Coconut, palm kernel, or palm oils. Regular salad dressings.  Pickles and olives. Salted popcorn and pretzels.  The items listed above may not be a complete list of foods and beverages to avoid.

## 2019-06-05 LAB — CBC WITH DIFFERENTIAL/PLATELET
Absolute Monocytes: 494 cells/uL (ref 200–950)
Basophils Absolute: 8 cells/uL (ref 0–200)
Basophils Relative: 0.1 %
Eosinophils Absolute: 23 cells/uL (ref 15–500)
Eosinophils Relative: 0.3 %
HCT: 40.5 % (ref 38.5–50.0)
Hemoglobin: 13.8 g/dL (ref 13.2–17.1)
Lymphs Abs: 950 cells/uL (ref 850–3900)
MCH: 31.4 pg (ref 27.0–33.0)
MCHC: 34.1 g/dL (ref 32.0–36.0)
MCV: 92.3 fL (ref 80.0–100.0)
MPV: 10.7 fL (ref 7.5–12.5)
Monocytes Relative: 6.5 %
Neutro Abs: 6126 cells/uL (ref 1500–7800)
Neutrophils Relative %: 80.6 %
Platelets: 245 10*3/uL (ref 140–400)
RBC: 4.39 10*6/uL (ref 4.20–5.80)
RDW: 11.8 % (ref 11.0–15.0)
Total Lymphocyte: 12.5 %
WBC: 7.6 10*3/uL (ref 3.8–10.8)

## 2019-06-05 LAB — LIPID PANEL
Cholesterol: 83 mg/dL (ref <200)
HDL: 20 mg/dL — ABNORMAL LOW (ref >=40)
LDL Cholesterol (Calc): 50 mg/dL (calc)
Non-HDL Cholesterol (Calc): 63 mg/dL (calc) (ref <130)
Total CHOL/HDL Ratio: 4.2 (calc) (ref <5.0)
Triglycerides: 47 mg/dL (ref <150)

## 2019-06-05 LAB — COMPLETE METABOLIC PANEL WITH GFR
AG Ratio: 1.3 (calc) (ref 1.0–2.5)
ALT: 44 U/L (ref 9–46)
AST: 67 U/L — ABNORMAL HIGH (ref 10–35)
Albumin: 3.9 g/dL (ref 3.6–5.1)
Alkaline phosphatase (APISO): 50 U/L (ref 35–144)
BUN/Creatinine Ratio: 11 (calc) (ref 6–22)
BUN: 15 mg/dL (ref 7–25)
CO2: 28 mmol/L (ref 20–32)
Calcium: 8.8 mg/dL (ref 8.6–10.3)
Chloride: 97 mmol/L — ABNORMAL LOW (ref 98–110)
Creat: 1.38 mg/dL — ABNORMAL HIGH (ref 0.70–1.25)
GFR, Est African American: 63 mL/min/{1.73_m2} (ref >=60)
GFR, Est Non African American: 54 mL/min/{1.73_m2} — ABNORMAL LOW (ref >=60)
Globulin: 3 g/dL (calc) (ref 1.9–3.7)
Glucose, Bld: 113 mg/dL — ABNORMAL HIGH (ref 65–99)
Potassium: 4.6 mmol/L (ref 3.5–5.3)
Sodium: 135 mmol/L (ref 135–146)
Total Bilirubin: 0.5 mg/dL (ref 0.2–1.2)
Total Protein: 6.9 g/dL (ref 6.1–8.1)

## 2019-06-05 LAB — HEMOGLOBIN A1C
Hgb A1c MFr Bld: 6.7 % of total Hgb — ABNORMAL HIGH (ref <5.7)
Mean Plasma Glucose: 146 (calc)
eAG (mmol/L): 8.1 (calc)

## 2019-06-05 LAB — VITAMIN D 25 HYDROXY (VIT D DEFICIENCY, FRACTURES): Vit D, 25-Hydroxy: 76 ng/mL (ref 30–100)

## 2019-06-05 LAB — MAGNESIUM: Magnesium: 1.9 mg/dL (ref 1.5–2.5)

## 2019-06-05 LAB — INSULIN, RANDOM: Insulin: 23 u[IU]/mL — ABNORMAL HIGH

## 2019-06-06 NOTE — Progress Notes (Signed)
06/06/2019-PATIENT IS AWARE OF LAB RESULTS AND INSTRUCTIONS. Sean Maldonado [facility]

## 2019-07-02 DIAGNOSIS — H35363 Drusen (degenerative) of macula, bilateral: Secondary | ICD-10-CM | POA: Diagnosis not present

## 2019-07-29 DIAGNOSIS — E559 Vitamin D deficiency, unspecified: Secondary | ICD-10-CM | POA: Diagnosis not present

## 2019-07-29 DIAGNOSIS — Z79899 Other long term (current) drug therapy: Secondary | ICD-10-CM | POA: Diagnosis not present

## 2019-07-29 DIAGNOSIS — Z6835 Body mass index (BMI) 35.0-35.9, adult: Secondary | ICD-10-CM | POA: Diagnosis not present

## 2019-07-29 DIAGNOSIS — D696 Thrombocytopenia, unspecified: Secondary | ICD-10-CM | POA: Diagnosis not present

## 2019-07-29 DIAGNOSIS — Z7952 Long term (current) use of systemic steroids: Secondary | ICD-10-CM | POA: Diagnosis not present

## 2019-07-29 DIAGNOSIS — E669 Obesity, unspecified: Secondary | ICD-10-CM | POA: Diagnosis not present

## 2019-07-29 DIAGNOSIS — M3219 Other organ or system involvement in systemic lupus erythematosus: Secondary | ICD-10-CM | POA: Diagnosis not present

## 2019-07-29 DIAGNOSIS — L309 Dermatitis, unspecified: Secondary | ICD-10-CM | POA: Diagnosis not present

## 2019-07-29 DIAGNOSIS — M329 Systemic lupus erythematosus, unspecified: Secondary | ICD-10-CM | POA: Diagnosis not present

## 2019-09-03 ENCOUNTER — Encounter: Payer: Self-pay | Admitting: Internal Medicine

## 2019-09-03 NOTE — Progress Notes (Signed)
Annual  Screening/Preventative Visit  & Comprehensive Evaluation & Examination     This very nice 64 y.o.  Single WM presents for a Screening /Preventative Visit & comprehensive evaluation and management of multiple medical co-morbidities.  Patient has been followed for HTN, HLD, T2_NIDDM  and Vitamin D Deficiency. Patient has hx/o Gout controlled on Allopurinol.       Patient is on Prednisone for  cutaneous Lupus & is followed at [facility] In Dec 2019, he had extensive NEGATIVE labs for Connective Tissue Disease.  He also is on Gabapentin for painful Peripheral Neuropathy     HTN predates circa 2004 & treatment started in 2008. Patient's BP has been controlled at home.  Today's BP is at goal - 118/68. Patient denies any cardiac symptoms as chest pain, palpitations, shortness of breath, dizziness or ankle swelling.     Patient's hyperlipidemia is controlled with diet and Rosuvastatin / Ezetamibe. Patient denies myalgias or other medication SE's. Last lipids were at goal:  Lab Results  Component Value Date   CHOL 83 06/04/2019   HDL 20 (L) 06/04/2019   LDLCALC 50 06/04/2019   TRIG 47 06/04/2019   CHOLHDL 4.2 06/04/2019       Patient has moderate obesity (BMI 36.9+) and consequent  prediabetes (A1c 5.9% / 2011 & 6.0% / 2016) and then T2_NIDDM (A1c  6.7%  /Apr 2020)  which he's attempting to control with diet.  Patient denies reactive hypoglycemic symptoms, visual blurring, diabetic polys or paresthesias. Last A1c was not at goal:  Lab Results  Component Value Date   HGBA1C 6.7 (H) 06/04/2019        Finally, patient has history of Vitamin D Deficiency ("42" / 2008) and last vitamin D was at goal:  Lab Results  Component Value Date   VD25OH 76 06/04/2019    Current Outpatient Medications on File Prior to Visit  Medication Sig  . allopurinol (ZYLOPRIM) 300 MG tablet Take 1 tablet Daily to Prevent Gout  . ALPRAZolam (XANAX) 1 MG tablet Take 1/2-1 tablet 2 - 3 x /day ONLY if needed  for Anxiety Attack &  limit to 5 days /week to avoid addiction  . aspirin EC 81 MG tablet Take 81 mg by mouth.  Marland Kitchen atenolol (TENORMIN) 100 MG tablet Take 1 tablet Daily for BP  . celecoxib (CELEBREX) 200 MG capsule Take 1 capsule daily for Pain & Inflammation  . Cholecalciferol (VITAMIN D3) 5000 UNITS CAPS Take 1 capsule by mouth daily.  Marland Kitchen ezetimibe (ZETIA) 10 MG tablet TAKE 1 TABLET DAILY FOR CHOLESTEROL  . gabapentin (NEURONTIN) 100 MG capsule Take 1 capsule 2 to 3 x /day as needed for Neuropathy Pain  . hydrOXYzine (ATARAX/VISTARIL) 25 MG tablet TAKE 2 TABLETS THREE TIMES DAILY AS NEEDED FOR ITCHING.  Marland Kitchen lisinopril-hydrochlorothiazide (ZESTORETIC) 20-25 MG tablet TAKE 1 TABLET ONCE DAILY.  . Magnesium 400 MG TABS Take 1 tablet by mouth daily.  Marland Kitchen MILK THISTLE PO Take 1 tablet by mouth daily.  . Multiple Vitamin (MULTIVITAMIN) capsule Take 1 capsule by mouth daily.  . Omega-3 Fatty Acids (FISH OIL PO) Take 1 capsule by mouth daily.  Marland Kitchen OVER THE COUNTER MEDICATION Takes Alphalophoric Acid 1 capsule daily  . predniSONE (DELTASONE) 10 MG tablet TAKE 1 OR 2 TABLETS DAILY AS DIRECTED BY DR.  Marland Kitchen Probiotic Product (PROBIOTIC DAILY) CAPS Take 1 capsule by mouth daily.  . rosuvastatin (CRESTOR) 20 MG tablet Take 1 tablet daily for Cholesterol (Patient taking differently: Take 1/2 tablet on Mondays  and Thurdays)  . triamcinolone cream (KENALOG) 0.1 % Apply 1 application topically 3 (three) times daily. (Patient taking differently: Apply 1 application topically as needed. )  . TURMERIC PO Take 2,000 mg by mouth daily.    No current facility-administered medications on file prior to visit.   Allergies  Allergen Reactions  . Imuran [Azathioprine] Rash  . Lipitor [Atorvastatin]     Myalgias   . Penicillins     Causes lupus flare ups  . Zocor [Simvastatin]     Myalgias   Past Medical History:  Diagnosis Date  . Asthma   . GERD (gastroesophageal reflux disease)   . Hypertension   . Lupus ([place])    . Prediabetes   . Seasonal allergies   . Vitamin D deficiency    Health Maintenance  Topic Date Due  . COVID-19 Vaccine (1) Never done  . OPHTHALMOLOGY EXAM  06/27/2018  . HEMOGLOBIN A1C  12/02/2019  . INFLUENZA VACCINE  12/08/2019  . FOOT EXAM  09/02/2020  . COLONOSCOPY  07/31/2021  . TETANUS/TDAP  07/19/2022  . PNEUMOCOCCAL POLYSACCHARIDE VACCINE AGE 31-64 HIGH RISK  Completed  . Hepatitis C Screening  Completed  . HIV Screening  Completed   Immunization History  Administered Date(s) Administered  . Influenza Inj Mdck Quad With Preservative 03/04/2019  . Influenza Split 02/26/2013, 02/09/2015  . PPD Test 07/22/2013, 07/23/2014, 07/24/2015, 08/14/2017, 08/27/2018  . Pneumococcal Polysaccharide-23 05/22/2008  . Tdap 07/18/2012   Last Colon - 08/01/2011 - Dr Sharlett Iles - recc 10 yr f/u due Apr 2023  Past Surgical History:  Procedure Laterality Date  . AXILLARY LYMPH NODE BIOPSY Right 08/27/2012   Procedure: AXILLARY LYMPH NODE BIOPSY ;  Surgeon: Rolm Bookbinder, MD;  Location: [facility];  Service: General;  Laterality: Right;  . CHOLECYSTECTOMY N/A 12/28/2012   Procedure: LAPAROSCOPIC CHOLECYSTECTOMY WITH INTRAOPERATIVE CHOLANGIOGRAM;  Surgeon: Joyice Faster. Cornett, MD;  Location: [facility];  Service: General;  Laterality: N/A;  . TONSILLECTOMY    . WISDOM TOOTH EXTRACTION     Family History  Problem Relation Age of Onset  . Diabetes Mother   . Heart disease Mother   . Colon polyps Neg Hx   . Colon cancer Neg Hx    Social History   Socioeconomic History  . Marital status: Single  Occupational History  . Occupation:  Designer, industrial/product AND [employer]  Tobacco Use  . Smoking status: Never Smoker  . Smokeless tobacco: Never Used  Substance and Sexual Activity  . Alcohol use: Yes    Comment: occasional  . Drug use: No  . Sexual activity: Not on file     ROS Constitutional: Denies fever, chills, weight loss/gain, headaches, insomnia,  night sweats or change in appetite.  Does c/o fatigue. Eyes: Denies redness, blurred vision, diplopia, discharge, itchy or watery eyes.  ENT: Denies discharge, congestion, post nasal drip, epistaxis, sore throat, earache, hearing loss, dental pain, Tinnitus, Vertigo, Sinus pain or snoring.  Cardio: Denies chest pain, palpitations, irregular heartbeat, syncope, dyspnea, diaphoresis, orthopnea, PND, claudication or edema Respiratory: denies cough, dyspnea, DOE, pleurisy, hoarseness, laryngitis or wheezing.  Gastrointestinal: Denies dysphagia, heartburn, reflux, water brash, pain, cramps, nausea, vomiting, bloating, diarrhea, constipation, hematemesis, melena, hematochezia, jaundice or hemorrhoids Genitourinary: Denies dysuria, frequency, urgency, nocturia, hesitancy, discharge, hematuria or flank pain Musculoskeletal: Denies arthralgia, myalgia, stiffness, Jt. Swelling, pain, limp or strain/sprain. Denies Falls. Skin: Denies puritis, rash, hives, warts, acne, eczema or change in skin lesion Neuro: No weakness, tremor, incoordination, spasms, paresthesia or pain Psychiatric: Denies confusion,  memory loss or sensory loss. Denies Depression. Endocrine: Denies change in weight, skin, hair change, nocturia, and paresthesia, diabetic polys, visual blurring or hyper / hypo glycemic episodes.  Heme/Lymph: No excessive bleeding, bruising or enlarged lymph nodes.  Physical Exam  BP 118/68   Pulse 72   Temp (!) 96.5 F (35.8 C)   Resp 16   Ht 5' 11"  (1.803 m)   Wt 256 lb (116.1 kg)   BMI 35.70 kg/m   General Appearance: Well nourished and well groomed and in no apparent distress.  Eyes: PERRLA, EOMs, conjunctiva no swelling or erythema, normal fundi and vessels. Sinuses: No frontal/maxillary tenderness ENT/Mouth: EACs patent / TMs  nl. Nares clear without erythema, swelling, mucoid exudates. Oral hygiene is good. No erythema, swelling, or exudate. Tongue normal, non-obstructing. Tonsils not swollen or erythematous. Hearing normal.   Neck: Supple, thyroid not palpable. No bruits, nodes or JVD. Respiratory: Respiratory effort normal.  BS equal and clear bilateral without rales, rhonci, wheezing or stridor. Cardio: Heart sounds are normal with regular rate and rhythm and no murmurs, rubs or gallops. Peripheral pulses are normal and equal bilaterally without edema. No aortic or femoral bruits. Chest: symmetric with normal excursions and percussion.  Abdomen: Soft, with Nl bowel sounds. Nontender, no guarding, rebound, hernias, masses, or organomegaly.  Lymphatics: Non tender without lymphadenopathy.  Musculoskeletal: Full ROM all peripheral extremities, joint stability, 5/5 strength, and normal gait. Skin: Warm and dry without rashes, lesions, cyanosis, clubbing or  ecchymosis.  Neuro: Cranial nerves intact, reflexes equal bilaterally. Normal muscle tone, no cerebellar symptoms. Sensation intact.  Pysch: Alert and oriented X 3 with normal affect, insight and judgment appropriate.   Assessment and Plan  1. Annual Preventative/Screening Exam   2. Essential hypertension  - EKG 12-Lead - [place], RETROPERITNL ABD,  LTD - CBC with Differential/Platelet - COMPLETE METABOLIC PANEL WITH GFR - Magnesium  3. Hyperlipidemia associated with type 2 diabetes mellitus ([place])  - EKG 12-Lead - [place], RETROPERITNL ABD,  LTD - Lipid panel - TSH  4. Type 2 diabetes mellitus with stage 2 chronic kidney disease,  without long-term current use of insulin ([name])  - EKG 12-Lead - [place], RETROPERITNL ABD,  LTD - Hemoglobin A1c - Insulin, random  5. Vitamin D deficiency  - VITAMIN D 25 Hydroxy  6. Idiopathic gout  - Uric acid  7. Diabetic neuropathy, painful ([place])  - HM DIABETES FOOT EXAM - LOW EXTREMITY NEUR EXAM DOCUM  8. Cutaneous lupus erythematosus  9. Testosterone deficiency  - Testosterone  10. Screening examination for pulmonary tuberculosis  - TB Skin Test  11. Screening for colorectal cancer  - POC Hemoccult  Bld/Stl   12. Screening for prostate cancer  - PSA  13. Screening for ischemic heart disease   14. FHx: heart disease  - EKG 12-Lead - [place], RETROPERITNL ABD,  LTD  15. Screening for AAA (aortic abdominal aneurysm)  - [place], RETROPERITNL ABD,  LTD - CBC with Differential/Platelet - TSH  16. Fatigue, unspecified type  - Urinalysis, Routine w reflex microscopic - Microalbumin / creatinine urine ratio - Iron,Total/Total Iron Binding Cap - Vitamin B12 - Testosterone  17. Medication management  - Uric acid - CBC with Differential/Platelet - COMPLETE METABOLIC PANEL WITH GFR - Magnesium - Lipid panel - TSH - Hemoglobin A1c - Insulin, random - VITAMIN D 25 Hydroxy         Patient was counseled in prudent diet, weight control to achieve/maintain BMI less than 25, BP monitoring, regular exercise  and medications as discussed.  Discussed med effects and SE's. Routine screening labs and tests as requested with regular follow-up as recommended. Over 40 minutes of exam, counseling, chart review and high complex critical decision making was performed   Kirtland Bouchard, MD

## 2019-09-03 NOTE — Patient Instructions (Signed)

## 2019-09-04 ENCOUNTER — Other Ambulatory Visit: Payer: Self-pay

## 2019-09-04 ENCOUNTER — Ambulatory Visit (INDEPENDENT_AMBULATORY_CARE_PROVIDER_SITE_OTHER): Payer: BC Managed Care – PPO | Admitting: Internal Medicine

## 2019-09-04 VITALS — BP 118/68 | HR 72 | Temp 96.5°F | Resp 16 | Ht 71.0 in | Wt 256.0 lb

## 2019-09-04 DIAGNOSIS — L932 Other local lupus erythematosus: Secondary | ICD-10-CM

## 2019-09-04 DIAGNOSIS — Z0001 Encounter for general adult medical examination with abnormal findings: Secondary | ICD-10-CM

## 2019-09-04 DIAGNOSIS — M1000 Idiopathic gout, unspecified site: Secondary | ICD-10-CM | POA: Diagnosis not present

## 2019-09-04 DIAGNOSIS — N401 Enlarged prostate with lower urinary tract symptoms: Secondary | ICD-10-CM | POA: Diagnosis not present

## 2019-09-04 DIAGNOSIS — Z111 Encounter for screening for respiratory tuberculosis: Secondary | ICD-10-CM | POA: Diagnosis not present

## 2019-09-04 DIAGNOSIS — E1122 Type 2 diabetes mellitus with diabetic chronic kidney disease: Secondary | ICD-10-CM

## 2019-09-04 DIAGNOSIS — Z136 Encounter for screening for cardiovascular disorders: Secondary | ICD-10-CM | POA: Diagnosis not present

## 2019-09-04 DIAGNOSIS — Z1389 Encounter for screening for other disorder: Secondary | ICD-10-CM | POA: Diagnosis not present

## 2019-09-04 DIAGNOSIS — Z130 Encounter for screening for diseases of the blood and blood-forming organs and certain disorders involving the immune mechanism: Secondary | ICD-10-CM

## 2019-09-04 DIAGNOSIS — Z79899 Other long term (current) drug therapy: Secondary | ICD-10-CM

## 2019-09-04 DIAGNOSIS — I10 Essential (primary) hypertension: Secondary | ICD-10-CM | POA: Diagnosis not present

## 2019-09-04 DIAGNOSIS — Z1329 Encounter for screening for other suspected endocrine disorder: Secondary | ICD-10-CM | POA: Diagnosis not present

## 2019-09-04 DIAGNOSIS — Z13220 Encounter for screening for lipoid disorders: Secondary | ICD-10-CM | POA: Diagnosis not present

## 2019-09-04 DIAGNOSIS — R350 Frequency of micturition: Secondary | ICD-10-CM | POA: Diagnosis not present

## 2019-09-04 DIAGNOSIS — Z0000 Encounter for general adult medical examination without abnormal findings: Secondary | ICD-10-CM | POA: Diagnosis not present

## 2019-09-04 DIAGNOSIS — Z131 Encounter for screening for diabetes mellitus: Secondary | ICD-10-CM

## 2019-09-04 DIAGNOSIS — E1140 Type 2 diabetes mellitus with diabetic neuropathy, unspecified: Secondary | ICD-10-CM

## 2019-09-04 DIAGNOSIS — E1169 Type 2 diabetes mellitus with other specified complication: Secondary | ICD-10-CM

## 2019-09-04 DIAGNOSIS — N182 Chronic kidney disease, stage 2 (mild): Secondary | ICD-10-CM

## 2019-09-04 DIAGNOSIS — Z8249 Family history of ischemic heart disease and other diseases of the circulatory system: Secondary | ICD-10-CM

## 2019-09-04 DIAGNOSIS — E559 Vitamin D deficiency, unspecified: Secondary | ICD-10-CM | POA: Diagnosis not present

## 2019-09-04 DIAGNOSIS — Z125 Encounter for screening for malignant neoplasm of prostate: Secondary | ICD-10-CM

## 2019-09-04 DIAGNOSIS — Z1211 Encounter for screening for malignant neoplasm of colon: Secondary | ICD-10-CM

## 2019-09-04 DIAGNOSIS — E349 Endocrine disorder, unspecified: Secondary | ICD-10-CM

## 2019-09-04 DIAGNOSIS — R5383 Other fatigue: Secondary | ICD-10-CM

## 2019-09-05 LAB — IRON, TOTAL/TOTAL IRON BINDING CAP
%SAT: 21 % (calc) (ref 20–48)
Iron: 60 ug/dL (ref 50–180)
TIBC: 289 mcg/dL (calc) (ref 250–425)

## 2019-09-05 LAB — TSH: TSH: 1.81 mIU/L (ref 0.40–4.50)

## 2019-09-05 LAB — CBC WITH DIFFERENTIAL/PLATELET
Absolute Monocytes: 624 cells/uL (ref 200–950)
Basophils Absolute: 40 cells/uL (ref 0–200)
Basophils Relative: 0.5 %
Eosinophils Absolute: 88 cells/uL (ref 15–500)
Eosinophils Relative: 1.1 %
HCT: 43.5 % (ref 38.5–50.0)
Hemoglobin: 15 g/dL (ref 13.2–17.1)
Lymphs Abs: 1384 cells/uL (ref 850–3900)
MCH: 32.5 pg (ref 27.0–33.0)
MCHC: 34.5 g/dL (ref 32.0–36.0)
MCV: 94.2 fL (ref 80.0–100.0)
MPV: 11.1 fL (ref 7.5–12.5)
Monocytes Relative: 7.8 %
Neutro Abs: 5864 cells/uL (ref 1500–7800)
Neutrophils Relative %: 73.3 %
Platelets: 274 10*3/uL (ref 140–400)
RBC: 4.62 10*6/uL (ref 4.20–5.80)
RDW: 13.6 % (ref 11.0–15.0)
Total Lymphocyte: 17.3 %
WBC: 8 10*3/uL (ref 3.8–10.8)

## 2019-09-05 LAB — URINALYSIS, ROUTINE W REFLEX MICROSCOPIC
Bilirubin Urine: NEGATIVE
Glucose, UA: NEGATIVE
Hgb urine dipstick: NEGATIVE
Ketones, ur: NEGATIVE
Leukocytes,Ua: NEGATIVE
Nitrite: NEGATIVE
Protein, ur: NEGATIVE
Specific Gravity, Urine: 1.012 (ref 1.001–1.03)
pH: 7 (ref 5.0–8.0)

## 2019-09-05 LAB — COMPLETE METABOLIC PANEL WITH GFR
AG Ratio: 1.5 (calc) (ref 1.0–2.5)
ALT: 69 U/L — ABNORMAL HIGH (ref 9–46)
AST: 73 U/L — ABNORMAL HIGH (ref 10–35)
Albumin: 4.3 g/dL (ref 3.6–5.1)
Alkaline phosphatase (APISO): 56 U/L (ref 35–144)
BUN: 16 mg/dL (ref 7–25)
CO2: 29 mmol/L (ref 20–32)
Calcium: 9.8 mg/dL (ref 8.6–10.3)
Chloride: 99 mmol/L (ref 98–110)
Creat: 0.96 mg/dL (ref 0.70–1.25)
GFR, Est African American: 98 mL/min/{1.73_m2} (ref >=60)
GFR, Est Non African American: 84 mL/min/{1.73_m2} (ref >=60)
Globulin: 2.9 g/dL (calc) (ref 1.9–3.7)
Glucose, Bld: 103 mg/dL — ABNORMAL HIGH (ref 65–99)
Potassium: 4.4 mmol/L (ref 3.5–5.3)
Sodium: 137 mmol/L (ref 135–146)
Total Bilirubin: 0.4 mg/dL (ref 0.2–1.2)
Total Protein: 7.2 g/dL (ref 6.1–8.1)

## 2019-09-05 LAB — MICROALBUMIN / CREATININE URINE RATIO
Creatinine, Urine: 101 mg/dL (ref 20–320)
Microalb Creat Ratio: 17 mcg/mg creat (ref <30)
Microalb, Ur: 1.7 mg/dL

## 2019-09-05 LAB — LIPID PANEL
Cholesterol: 194 mg/dL (ref <200)
HDL: 31 mg/dL — ABNORMAL LOW (ref >=40)
LDL Cholesterol (Calc): 122 mg/dL (calc) — ABNORMAL HIGH
Non-HDL Cholesterol (Calc): 163 mg/dL (calc) — ABNORMAL HIGH (ref <130)
Total CHOL/HDL Ratio: 6.3 (calc) — ABNORMAL HIGH (ref <5.0)
Triglycerides: 267 mg/dL — ABNORMAL HIGH (ref <150)

## 2019-09-05 LAB — VITAMIN D 25 HYDROXY (VIT D DEFICIENCY, FRACTURES): Vit D, 25-Hydroxy: 63 ng/mL (ref 30–100)

## 2019-09-05 LAB — VITAMIN B12: Vitamin B-12: 476 pg/mL (ref 200–1100)

## 2019-09-05 LAB — HEMOGLOBIN A1C
Hgb A1c MFr Bld: 5.9 % of total Hgb — ABNORMAL HIGH (ref <5.7)
Mean Plasma Glucose: 123 (calc)
eAG (mmol/L): 6.8 (calc)

## 2019-09-05 LAB — INSULIN, RANDOM: Insulin: 60.7 u[IU]/mL — ABNORMAL HIGH

## 2019-09-05 LAB — URIC ACID: Uric Acid, Serum: 5.7 mg/dL (ref 4.0–8.0)

## 2019-09-05 LAB — MAGNESIUM: Magnesium: 1.9 mg/dL (ref 1.5–2.5)

## 2019-09-05 LAB — TESTOSTERONE: Testosterone: 529 ng/dL (ref 250–827)

## 2019-09-05 LAB — PSA: PSA: 0.3 ng/mL (ref <=4.0)

## 2019-09-10 ENCOUNTER — Other Ambulatory Visit: Payer: Self-pay | Admitting: Internal Medicine

## 2019-09-10 ENCOUNTER — Other Ambulatory Visit: Payer: Self-pay | Admitting: Adult Health

## 2019-09-11 ENCOUNTER — Telehealth: Payer: Self-pay | Admitting: *Deleted

## 2019-09-11 LAB — TB SKIN TEST
Induration: 0 mm
TB Skin Test: NEGATIVE

## 2019-09-11 NOTE — Telephone Encounter (Signed)
Pt aware of lab results 

## 2019-10-29 DIAGNOSIS — H43391 Other vitreous opacities, right eye: Secondary | ICD-10-CM | POA: Diagnosis not present

## 2019-12-03 NOTE — Progress Notes (Signed)
FOLLOW UP  Assessment and Plan:   Hypertension Well controlled with current medications  Monitor blood pressure at home; patient to call if consistently greater than 130/80 Continue DASH diet.   Reminder to go to the ER if any CP, SOB, nausea, dizziness, severe HA, changes vision/speech, left arm numbness and tingling and jaw pain.  NASH Weight loss and low GI diet advised, will monitor LFTs  Lifestyle managed T2DM Continue diet and exercise.  Perform daily foot/skin check, notify office of any concerning changes.  Check A1C  Cholesterol Currently above goal; intolerant of atorvastatin, zocor; rosuvastatin Continue zeita; declines trials of other medications  LDL goal <70 Continue low cholesterol diet and exercise.  Check lipid panel.   Morbid Obesity with co morbidities Long discussion about weight loss, diet, and exercise Recommended diet heavy in fruits and veggies and low in animal meats, cheeses, and dairy products, appropriate calorie intake Discussed ideal weight for height  Patient will work on low GI diet, whole foods/plant based for antiinflammatory benefits discussed at length Will follow up in 3 months  Vitamin D Def At goal at last visit; continue supplementation to maintain goal of 70-100 Defer Vit D level  Gout Continue allopurinol Diet discussed Check uric acid as needed   Lupus Intolerant of imuran Stable on prednisone; continue to monitor Followed by Jeanmarie Plant  Continue diet and meds as discussed. Further disposition pending results of labs. Discussed med's effects and SE's.   Over 30 minutes of exam, counseling, chart review, and critical decision making was performed.   Future Appointments  Date Time Provider [place]  03/11/2020  9:30 AM [name], MD [facility] None  09/08/2020  2:00 PM [name], MD [facility] None     ----------------------------------------------------------------------------------------------------------------------  HPI 64 y.o. male  presents for 3 month follow up on hypertension, cholesterol, diabetes, obesity, gout and vitamin D deficiency.   Patient was diagnosed with cutaneous Lupus in 2017 and is followed at [facility]  Aug 2018 patient had a severe intolerant or allergic reaction to Imuran. Since then his cutaneous Lupus symptoms seem reasonably controlled on Prednisone 5 mg daily.   He is on rare PRN xanax for anxiety exacerbated by prednisone, estimates once a week.    BMI is Body mass index is 36.12 kg/m., he has been working on exercise, admits to poor diet, fairly poorly motivated to work on this.  Wt Readings from Last 3 Encounters:  12/04/19 (!) 259 lb (117.5 kg)  09/04/19 256 lb (116.1 kg)  06/04/19 256 lb 6.4 oz (116.3 kg)   His blood pressure has been controlled at home (120s/80s), today their BP is BP: 124/80   He does workout. He denies chest pain, shortness of breath, dizziness.    He is on cholesterol medication (cannot tolerate lipitor, zocor, rosuvastatin on zetia 10 mg daily) and denies myalgias. His cholesterol is not at goal. The cholesterol last visit was:    Lab Results  Component Value Date   CHOL 194 09/04/2019   HDL 31 (L) 09/04/2019   LDLCALC 122 (H) 09/04/2019   TRIG 267 (H) 09/04/2019   CHOLHDL 6.3 (H) 09/04/2019     He has been working on diet and exercise for T2DM (A1C 6.7% in 08/2018), currently managed by lifestyle, and denies foot ulcerations, increased appetite, nausea, polydipsia, polyuria, visual disturbances, vomiting and weight loss. Last A1C in the office was:  Lab Results  Component Value Date   HGBA1C 5.9 (H) 09/04/2019    He has CKD  II associated with T2DM monitored at this office:  Lab Results  Component Value Date   [facility] 84 09/04/2019   Patient is on Vitamin D supplement.   Lab Results  Component Value Date    VD25OH 63 09/04/2019     Patient has hx of gout though has stopped taking allopurinol, controlled on low dose prednisone, and does not report a recent flare.  Lab Results  Component Value Date   LABURIC 5.7 09/04/2019       Current Medications:  Current Outpatient Medications on File Prior to Visit  Medication Sig  . allopurinol (ZYLOPRIM) 300 MG tablet Take 1 tablet Daily to Prevent Gout  . ALPRAZolam (XANAX) 1 MG tablet TAKE 1/2-1 TAB 2-3 TIMES A DAY ONLY IF NEEDED FOR ANXIETY.TRY TO LIMIT TO 5DAYS/WEEK.  Marland Kitchen aspirin EC 81 MG tablet Take 81 mg by mouth.  Marland Kitchen atenolol (TENORMIN) 100 MG tablet Take 1 tablet Daily for BP  . celecoxib (CELEBREX) 200 MG capsule Take 1 capsule daily for Pain & Inflammation  . Cholecalciferol (VITAMIN D3) 5000 UNITS CAPS Take 1 capsule by mouth daily. Take 10,000 units daily  . ezetimibe (ZETIA) 10 MG tablet TAKE 1 TABLET DAILY FOR CHOLESTEROL  . gabapentin (NEURONTIN) 100 MG capsule Take 1 capsule 2 to 3 x /day as needed for Neuropathy Pain  . hydrOXYzine (ATARAX/VISTARIL) 25 MG tablet TAKE 2 TABLETS THREE TIMES DAILY AS NEEDED FOR ITCHING.  Marland Kitchen lisinopril-hydrochlorothiazide (ZESTORETIC) 20-25 MG tablet TAKE 1 TABLET ONCE DAILY FOR BLOOD PRESSURE.  . Magnesium 400 MG TABS Take 1 tablet by mouth daily.  Marland Kitchen MILK THISTLE PO Take 1 tablet by mouth daily.  . Multiple Vitamin (MULTIVITAMIN) capsule Take 1 capsule by mouth daily.  . Omega-3 Fatty Acids (FISH OIL PO) Take 1 capsule by mouth daily.  Marland Kitchen OVER THE COUNTER MEDICATION Takes Alphalophoric Acid 1 capsule daily  . predniSONE (DELTASONE) 10 MG tablet TAKE 1 OR 2 TABLETS DAILY AS DIRECTED BY DR.  Marland Kitchen Probiotic Product (PROBIOTIC DAILY) CAPS Take 1 capsule by mouth daily.  Marland Kitchen triamcinolone cream (KENALOG) 0.1 % Apply 1 application topically 3 (three) times daily. (Patient taking differently: Apply 1 application topically as needed. )  . TURMERIC PO Take 2,000 mg by mouth daily.   . rosuvastatin (CRESTOR) 20 MG  tablet Take 1 tablet daily for Cholesterol (Patient not taking: Reported on 09/04/2019)   No current facility-administered medications on file prior to visit.     Allergies:  Allergies  Allergen Reactions  . Imuran [Azathioprine] Rash  . Lipitor [Atorvastatin]     Myalgias   . Penicillins     Causes lupus flare ups  . Zocor [Simvastatin]     Myalgias     Medical History:  Past Medical History:  Diagnosis Date  . Asthma   . GERD (gastroesophageal reflux disease)   . Hypertension   . Lupus ([place])   . Prediabetes   . Seasonal allergies   . Vitamin D deficiency    Family history- Reviewed and unchanged Social history- Reviewed and unchanged   Review of Systems:  Review of Systems  Constitutional: Negative for malaise/fatigue and weight loss.  HENT: Negative for hearing loss and tinnitus.   Eyes: Negative for blurred vision and double vision.  Respiratory: Negative for cough, shortness of breath and wheezing.   Cardiovascular: Negative for chest pain, palpitations, orthopnea, claudication and leg swelling.  Gastrointestinal: Negative for abdominal pain, blood in stool, constipation, diarrhea, heartburn, melena, nausea and vomiting.  Genitourinary: Negative.  Musculoskeletal: Negative for joint pain and myalgias.  Skin: Negative for rash.  Neurological: Positive for tingling (bilateral feet). Negative for dizziness, sensory change, weakness and headaches.  Endo/Heme/Allergies: Negative for polydipsia.  Psychiatric/Behavioral: Negative.   All other systems reviewed and are negative.   Physical Exam: BP 124/80   Pulse 75   Temp (!) 97.5 F (36.4 C)   Ht 5' 11"  (1.803 m)   Wt (!) 259 lb (117.5 kg)   SpO2 97%   BMI 36.12 kg/m  Wt Readings from Last 3 Encounters:  12/04/19 (!) 259 lb (117.5 kg)  09/04/19 256 lb (116.1 kg)  06/04/19 256 lb 6.4 oz (116.3 kg)     General Appearance: Well nourished, in no apparent distress. Eyes: PERRLA, EOMs, conjunctiva no  swelling or erythema Sinuses: No Frontal/maxillary tenderness ENT/Mouth: Ext aud canals clear, TMs without erythema, bulging. No erythema, swelling, or exudate on post pharynx.  Tonsils not swollen or erythematous. Hearing normal.  Neck: Supple, thyroid normal.  Respiratory: Respiratory effort normal, BS equal bilaterally without rales, rhonchi, wheezing or stridor.  Cardio: RRR with no MRGs. Brisk peripheral pulses without edema.  Abdomen: Soft, + BS.  Non tender, no guarding, rebound, hernias, masses. Lymphatics: Non tender without lymphadenopathy.  Musculoskeletal: Full ROM, 5/5 strength, Normal gait Skin: Warm, dry; Scattered erythematous to velvety pink to violaceous circumscribed lesions occasionally excoriated and some almost plaque-like over the scalp, trunk & extremities.  Neuro: Cranial nerves intact. No cerebellar symptoms.  Psych: Awake and oriented X 3, normal affect, Insight and Judgment appropriate.    Izora Ribas, NP 8:53 AM [facility]

## 2019-12-04 ENCOUNTER — Other Ambulatory Visit: Payer: Self-pay

## 2019-12-04 ENCOUNTER — Ambulatory Visit (INDEPENDENT_AMBULATORY_CARE_PROVIDER_SITE_OTHER): Payer: BC Managed Care – PPO | Admitting: Adult Health

## 2019-12-04 ENCOUNTER — Other Ambulatory Visit: Payer: Self-pay | Admitting: Adult Health

## 2019-12-04 ENCOUNTER — Encounter: Payer: Self-pay | Admitting: Adult Health

## 2019-12-04 VITALS — BP 124/80 | HR 75 | Temp 97.5°F | Ht 71.0 in | Wt 259.0 lb

## 2019-12-04 DIAGNOSIS — Z79899 Other long term (current) drug therapy: Secondary | ICD-10-CM | POA: Diagnosis not present

## 2019-12-04 DIAGNOSIS — E1169 Type 2 diabetes mellitus with other specified complication: Secondary | ICD-10-CM | POA: Diagnosis not present

## 2019-12-04 DIAGNOSIS — E669 Obesity, unspecified: Secondary | ICD-10-CM

## 2019-12-04 DIAGNOSIS — N182 Chronic kidney disease, stage 2 (mild): Secondary | ICD-10-CM

## 2019-12-04 DIAGNOSIS — I10 Essential (primary) hypertension: Secondary | ICD-10-CM | POA: Diagnosis not present

## 2019-12-04 DIAGNOSIS — M3219 Other organ or system involvement in systemic lupus erythematosus: Secondary | ICD-10-CM

## 2019-12-04 DIAGNOSIS — E119 Type 2 diabetes mellitus without complications: Secondary | ICD-10-CM

## 2019-12-04 DIAGNOSIS — E785 Hyperlipidemia, unspecified: Secondary | ICD-10-CM

## 2019-12-04 DIAGNOSIS — F419 Anxiety disorder, unspecified: Secondary | ICD-10-CM | POA: Diagnosis not present

## 2019-12-04 DIAGNOSIS — M1000 Idiopathic gout, unspecified site: Secondary | ICD-10-CM

## 2019-12-04 DIAGNOSIS — E1122 Type 2 diabetes mellitus with diabetic chronic kidney disease: Secondary | ICD-10-CM | POA: Diagnosis not present

## 2019-12-04 DIAGNOSIS — E559 Vitamin D deficiency, unspecified: Secondary | ICD-10-CM

## 2019-12-04 NOTE — Addendum Note (Signed)
Addended by: Izora Ribas on: 12/04/2019 09:14 AM   Modules accepted: Orders

## 2019-12-04 NOTE — Patient Instructions (Signed)
Goals    . Blood Pressure < 130/80    . HEMOGLOBIN A1C < 5.7    . Weight (lb) < 250 lb (113.4 kg)       High-Fiber Diet Fiber, also called dietary fiber, is a type of carbohydrate that is found in fruits, vegetables, whole grains, and beans. A high-fiber diet can have many health benefits. Your health care provider may recommend a high-fiber diet to help:  Prevent constipation. Fiber can make your bowel movements more regular.  Lower your cholesterol.  Relieve the following conditions: ? Swelling of veins in the anus (hemorrhoids). ? Swelling and irritation (inflammation) of specific areas of the digestive tract (uncomplicated diverticulosis). ? A problem of the large intestine (colon) that sometimes causes pain and diarrhea (irritable bowel syndrome, IBS).  Prevent overeating as part of a weight-loss plan.  Prevent heart disease, type 2 diabetes, and certain cancers. What is my plan? The recommended daily fiber intake in grams (g) includes:  38 g for men age 30 or younger.  30 g for men over age 10.  38 g for women age 82 or younger.  21 g for women over age 66. You can get the recommended daily intake of dietary fiber by:  Eating a variety of fruits, vegetables, grains, and beans.  Taking a fiber supplement, if it is not possible to get enough fiber through your diet. What do I need to know about a high-fiber diet?  It is better to get fiber through food sources rather than from fiber supplements. There is not a lot of research about how effective supplements are.  Always check the fiber content on the nutrition facts label of any prepackaged food. Look for foods that contain 5 g of fiber or more per serving.  Talk with a diet and nutrition specialist (dietitian) if you have questions about specific foods that are recommended or not recommended for your medical condition, especially if those foods are not listed below.  Gradually increase how much fiber you consume. If  you increase your intake of dietary fiber too quickly, you may have bloating, cramping, or gas.  Drink plenty of water. Water helps you to digest fiber. What are tips for following this plan?  Eat a wide variety of high-fiber foods.  Make sure that half of the grains that you eat each day are whole grains.  Eat breads and cereals that are made with whole-grain flour instead of refined flour or white flour.  Eat brown rice, bulgur wheat, or millet instead of white rice.  Start the day with a breakfast that is high in fiber, such as a cereal that contains 5 g of fiber or more per serving.  Use beans in place of meat in soups, salads, and pasta dishes.  Eat high-fiber snacks, such as berries, raw vegetables, nuts, and popcorn.  Choose whole fruits and vegetables instead of processed forms like juice or sauce. What foods can I eat?  Fruits Berries. Pears. Apples. Oranges. Avocado. Prunes and raisins. Dried figs. Vegetables Sweet potatoes. Spinach. Kale. Artichokes. Cabbage. Broccoli. Cauliflower. Green peas. Carrots. Squash. Grains Whole-grain breads. Multigrain cereal. Oats and oatmeal. Brown rice. Barley. Bulgur wheat. [place]. Quinoa. Bran muffins. Popcorn. Rye wafer crackers. Meats and other proteins [place], kidney, and pinto beans. Soybeans. Split peas. Lentils. Nuts and seeds. Dairy Fiber-fortified yogurt. Beverages Fiber-fortified soy milk. Fiber-fortified orange juice. Other foods Fiber bars. The items listed above may not be a complete list of recommended foods and beverages. Contact a dietitian  for more options. What foods are not recommended? Fruits Fruit juice. Cooked, strained fruit. Vegetables Fried potatoes. Canned vegetables. Well-cooked vegetables. Grains White bread. Pasta made with refined flour. White rice. Meats and other proteins Fatty cuts of meat. Fried chicken or fried fish. Dairy Milk. Yogurt. Cream cheese. Sour cream. Fats and  oils But[place]. Beverages Soft drinks. Other foods Cakes and pastries. The items listed above may not be a complete list of foods and beverages to avoid. Contact a dietitian for more information. Summary  Fiber is a type of carbohydrate. It is found in fruits, vegetables, whole grains, and beans.  There are many health benefits of eating a high-fiber diet, such as preventing constipation, lowering blood cholesterol, helping with weight loss, and reducing your risk of heart disease, diabetes, and certain cancers.  Gradually increase your intake of fiber. Increasing too fast can result in cramping, bloating, and gas. Drink plenty of water while you increase your fiber.  The best sources of fiber include whole fruits and vegetables, whole grains, nuts, seeds, and beans. This information is not intended to replace advice given to you by your health care provider. Make sure you discuss any questions you have with your health care provider. Document Revised: 02/27/2017 Document Reviewed: 02/27/2017 Elsevier Patient Education  2020 [employer].

## 2019-12-05 LAB — COMPLETE METABOLIC PANEL WITH GFR
AG Ratio: 1.3 (calc) (ref 1.0–2.5)
ALT: 81 U/L — ABNORMAL HIGH (ref 9–46)
AST: 81 U/L — ABNORMAL HIGH (ref 10–35)
Albumin: 4 g/dL (ref 3.6–5.1)
Alkaline phosphatase (APISO): 51 U/L (ref 35–144)
BUN: 14 mg/dL (ref 7–25)
CO2: 28 mmol/L (ref 20–32)
Calcium: 9 mg/dL (ref 8.6–10.3)
Chloride: 100 mmol/L (ref 98–110)
Creat: 1.1 mg/dL (ref 0.70–1.25)
GFR, Est African American: 83 mL/min/{1.73_m2} (ref >=60)
GFR, Est Non African American: 72 mL/min/{1.73_m2} (ref >=60)
Globulin: 3 g/dL (calc) (ref 1.9–3.7)
Glucose, Bld: 115 mg/dL — ABNORMAL HIGH (ref 65–99)
Potassium: 4.2 mmol/L (ref 3.5–5.3)
Sodium: 136 mmol/L (ref 135–146)
Total Bilirubin: 1 mg/dL (ref 0.2–1.2)
Total Protein: 7 g/dL (ref 6.1–8.1)

## 2019-12-05 LAB — CBC WITH DIFFERENTIAL/PLATELET
Absolute Monocytes: 576 cells/uL (ref 200–950)
Basophils Absolute: 38 cells/uL (ref 0–200)
Basophils Relative: 0.6 %
Eosinophils Absolute: 160 cells/uL (ref 15–500)
Eosinophils Relative: 2.5 %
HCT: 41.7 % (ref 38.5–50.0)
Hemoglobin: 14.2 g/dL (ref 13.2–17.1)
Lymphs Abs: 1280 cells/uL (ref 850–3900)
MCH: 32.3 pg (ref 27.0–33.0)
MCHC: 34.1 g/dL (ref 32.0–36.0)
MCV: 95 fL (ref 80.0–100.0)
MPV: 10.9 fL (ref 7.5–12.5)
Monocytes Relative: 9 %
Neutro Abs: 4346 cells/uL (ref 1500–7800)
Neutrophils Relative %: 67.9 %
Platelets: 221 10*3/uL (ref 140–400)
RBC: 4.39 10*6/uL (ref 4.20–5.80)
RDW: 12.9 % (ref 11.0–15.0)
Total Lymphocyte: 20 %
WBC: 6.4 10*3/uL (ref 3.8–10.8)

## 2019-12-05 LAB — MAGNESIUM: Magnesium: 1.8 mg/dL (ref 1.5–2.5)

## 2019-12-05 LAB — HEMOGLOBIN A1C
Hgb A1c MFr Bld: 6.5 % of total Hgb — ABNORMAL HIGH (ref <5.7)
Mean Plasma Glucose: 140 (calc)
eAG (mmol/L): 7.7 (calc)

## 2019-12-05 LAB — LIPID PANEL
Cholesterol: 179 mg/dL (ref <200)
HDL: 26 mg/dL — ABNORMAL LOW (ref >=40)
LDL Cholesterol (Calc): 128 mg/dL (calc) — ABNORMAL HIGH
Non-HDL Cholesterol (Calc): 153 mg/dL (calc) — ABNORMAL HIGH (ref <130)
Total CHOL/HDL Ratio: 6.9 (calc) — ABNORMAL HIGH (ref <5.0)
Triglycerides: 141 mg/dL (ref <150)

## 2019-12-05 LAB — TSH: TSH: 3.56 mIU/L (ref 0.40–4.50)

## 2019-12-11 ENCOUNTER — Other Ambulatory Visit: Payer: Self-pay | Admitting: Internal Medicine

## 2020-01-10 ENCOUNTER — Other Ambulatory Visit: Payer: Self-pay | Admitting: Internal Medicine

## 2020-01-10 MED ORDER — AZITHROMYCIN 250 MG PO TABS: ORAL_TABLET | ORAL | 1 refills | Status: DC

## 2020-01-17 ENCOUNTER — Emergency Department (HOSPITAL_COMMUNITY): Payer: BC Managed Care – PPO

## 2020-01-17 ENCOUNTER — Encounter (HOSPITAL_COMMUNITY)
Admission: EM | Disposition: A | Discharge status: Home / Self Care | Payer: Self-pay | Source: Home / Self Care | Attending: Orthopedic Surgery

## 2020-01-17 ENCOUNTER — Observation Stay (HOSPITAL_COMMUNITY)
Admission: EM | Admit: 2020-01-17 | Discharge: 2020-01-18 | Disposition: A | Discharge status: Home / Self Care | Payer: BC Managed Care – PPO | Attending: Orthopedic Surgery | Admitting: Orthopedic Surgery

## 2020-01-17 ENCOUNTER — Emergency Department (HOSPITAL_COMMUNITY): Payer: BC Managed Care – PPO | Admitting: Certified Registered"

## 2020-01-17 ENCOUNTER — Encounter (HOSPITAL_COMMUNITY): Payer: Self-pay | Admitting: Pharmacy Technician

## 2020-01-17 ENCOUNTER — Other Ambulatory Visit: Payer: Self-pay

## 2020-01-17 DIAGNOSIS — R52 Pain, unspecified: Secondary | ICD-10-CM | POA: Diagnosis not present

## 2020-01-17 DIAGNOSIS — J158 Pneumonia due to other specified bacteria: Secondary | ICD-10-CM | POA: Diagnosis not present

## 2020-01-17 DIAGNOSIS — S01111A Laceration without foreign body of right eyelid and periocular area, initial encounter: Secondary | ICD-10-CM | POA: Diagnosis not present

## 2020-01-17 DIAGNOSIS — S52502A Unspecified fracture of the lower end of left radius, initial encounter for closed fracture: Secondary | ICD-10-CM | POA: Diagnosis not present

## 2020-01-17 DIAGNOSIS — S62102A Fracture of unspecified carpal bone, left wrist, initial encounter for closed fracture: Secondary | ICD-10-CM | POA: Diagnosis not present

## 2020-01-17 DIAGNOSIS — S52592A Other fractures of lower end of left radius, initial encounter for closed fracture: Secondary | ICD-10-CM | POA: Diagnosis not present

## 2020-01-17 DIAGNOSIS — T1490XA Injury, unspecified, initial encounter: Secondary | ICD-10-CM

## 2020-01-17 DIAGNOSIS — I6529 Occlusion and stenosis of unspecified carotid artery: Secondary | ICD-10-CM | POA: Diagnosis not present

## 2020-01-17 DIAGNOSIS — J189 Pneumonia, unspecified organism: Secondary | ICD-10-CM | POA: Insufficient documentation

## 2020-01-17 DIAGNOSIS — Y92410 Unspecified street and highway as the place of occurrence of the external cause: Secondary | ICD-10-CM | POA: Insufficient documentation

## 2020-01-17 DIAGNOSIS — M795 Residual foreign body in soft tissue: Secondary | ICD-10-CM | POA: Diagnosis not present

## 2020-01-17 DIAGNOSIS — I129 Hypertensive chronic kidney disease with stage 1 through stage 4 chronic kidney disease, or unspecified chronic kidney disease: Secondary | ICD-10-CM | POA: Diagnosis not present

## 2020-01-17 DIAGNOSIS — S62109A Fracture of unspecified carpal bone, unspecified wrist, initial encounter for closed fracture: Secondary | ICD-10-CM

## 2020-01-17 DIAGNOSIS — M2578 Osteophyte, vertebrae: Secondary | ICD-10-CM | POA: Diagnosis not present

## 2020-01-17 DIAGNOSIS — S3993XA Unspecified injury of pelvis, initial encounter: Secondary | ICD-10-CM | POA: Diagnosis not present

## 2020-01-17 DIAGNOSIS — Z20822 Contact with and (suspected) exposure to covid-19: Secondary | ICD-10-CM | POA: Diagnosis not present

## 2020-01-17 DIAGNOSIS — N182 Chronic kidney disease, stage 2 (mild): Secondary | ICD-10-CM | POA: Diagnosis not present

## 2020-01-17 DIAGNOSIS — Y93I9 Activity, other involving external motion: Secondary | ICD-10-CM | POA: Diagnosis not present

## 2020-01-17 DIAGNOSIS — Z79899 Other long term (current) drug therapy: Secondary | ICD-10-CM | POA: Diagnosis not present

## 2020-01-17 DIAGNOSIS — I7090 Unspecified atherosclerosis: Secondary | ICD-10-CM | POA: Diagnosis not present

## 2020-01-17 DIAGNOSIS — Z7982 Long term (current) use of aspirin: Secondary | ICD-10-CM | POA: Insufficient documentation

## 2020-01-17 DIAGNOSIS — S199XXA Unspecified injury of neck, initial encounter: Secondary | ICD-10-CM | POA: Diagnosis not present

## 2020-01-17 DIAGNOSIS — M47812 Spondylosis without myelopathy or radiculopathy, cervical region: Secondary | ICD-10-CM | POA: Diagnosis not present

## 2020-01-17 DIAGNOSIS — S0101XA Laceration without foreign body of scalp, initial encounter: Secondary | ICD-10-CM | POA: Insufficient documentation

## 2020-01-17 DIAGNOSIS — K6389 Other specified diseases of intestine: Secondary | ICD-10-CM | POA: Diagnosis not present

## 2020-01-17 DIAGNOSIS — S60922A Unspecified superficial injury of left hand, initial encounter: Secondary | ICD-10-CM | POA: Diagnosis not present

## 2020-01-17 DIAGNOSIS — S299XXA Unspecified injury of thorax, initial encounter: Secondary | ICD-10-CM | POA: Diagnosis not present

## 2020-01-17 DIAGNOSIS — E1122 Type 2 diabetes mellitus with diabetic chronic kidney disease: Secondary | ICD-10-CM | POA: Diagnosis not present

## 2020-01-17 DIAGNOSIS — S52372A Galeazzi's fracture of left radius, initial encounter for closed fracture: Secondary | ICD-10-CM | POA: Diagnosis not present

## 2020-01-17 DIAGNOSIS — S0181XA Laceration without foreign body of other part of head, initial encounter: Secondary | ICD-10-CM | POA: Diagnosis not present

## 2020-01-17 DIAGNOSIS — K5939 Other megacolon: Secondary | ICD-10-CM | POA: Diagnosis not present

## 2020-01-17 DIAGNOSIS — S61412A Laceration without foreign body of left hand, initial encounter: Secondary | ICD-10-CM | POA: Diagnosis not present

## 2020-01-17 DIAGNOSIS — J45909 Unspecified asthma, uncomplicated: Secondary | ICD-10-CM | POA: Diagnosis not present

## 2020-01-17 DIAGNOSIS — I700 Atherosclerosis of aorta: Secondary | ICD-10-CM | POA: Diagnosis not present

## 2020-01-17 DIAGNOSIS — J984 Other disorders of lung: Secondary | ICD-10-CM | POA: Diagnosis not present

## 2020-01-17 DIAGNOSIS — I10 Essential (primary) hypertension: Secondary | ICD-10-CM | POA: Insufficient documentation

## 2020-01-17 DIAGNOSIS — S52572A Other intraarticular fracture of lower end of left radius, initial encounter for closed fracture: Secondary | ICD-10-CM | POA: Diagnosis not present

## 2020-01-17 DIAGNOSIS — K219 Gastro-esophageal reflux disease without esophagitis: Secondary | ICD-10-CM | POA: Insufficient documentation

## 2020-01-17 DIAGNOSIS — S59902A Unspecified injury of left elbow, initial encounter: Secondary | ICD-10-CM | POA: Diagnosis not present

## 2020-01-17 DIAGNOSIS — J323 Chronic sphenoidal sinusitis: Secondary | ICD-10-CM | POA: Diagnosis not present

## 2020-01-17 HISTORY — PX: ORIF WRIST FRACTURE: SHX2133

## 2020-01-17 HISTORY — DX: Fracture of unspecified carpal bone, unspecified wrist, initial encounter for closed fracture: S62.109A

## 2020-01-17 LAB — PROTIME-INR
INR: 1 (ref 0.8–1.2)
Prothrombin Time: 12.3 seconds (ref 11.4–15.2)

## 2020-01-17 LAB — COMPREHENSIVE METABOLIC PANEL
ALT: 92 U/L — ABNORMAL HIGH (ref 0–44)
AST: 102 U/L — ABNORMAL HIGH (ref 15–41)
Albumin: 3.5 g/dL (ref 3.5–5.0)
Alkaline Phosphatase: 50 U/L (ref 38–126)
Anion gap: 15 (ref 5–15)
BUN: 13 mg/dL (ref 8–23)
CO2: 19 mmol/L — ABNORMAL LOW (ref 22–32)
Calcium: 9.1 mg/dL (ref 8.9–10.3)
Chloride: 98 mmol/L (ref 98–111)
Creatinine, Ser: 1.07 mg/dL (ref 0.61–1.24)
GFR calc Af Amer: >60 mL/min (ref >60)
GFR calc non Af Amer: >60 mL/min (ref >60)
Glucose, Bld: 129 mg/dL — ABNORMAL HIGH (ref 70–99)
Potassium: 3.9 mmol/L (ref 3.5–5.1)
Sodium: 132 mmol/L — ABNORMAL LOW (ref 135–145)
Total Bilirubin: 0.7 mg/dL (ref 0.3–1.2)
Total Protein: 6.8 g/dL (ref 6.5–8.1)

## 2020-01-17 LAB — I-STAT CHEM 8, ED
BUN: 16 mg/dL (ref 8–23)
Calcium, Ion: 1.02 mmol/L — ABNORMAL LOW (ref 1.15–1.40)
Chloride: 99 mmol/L (ref 98–111)
Creatinine, Ser: 1 mg/dL (ref 0.61–1.24)
Glucose, Bld: 128 mg/dL — ABNORMAL HIGH (ref 70–99)
HCT: 43 % (ref 39.0–52.0)
Hemoglobin: 14.6 g/dL (ref 13.0–17.0)
Potassium: 4.2 mmol/L (ref 3.5–5.1)
Sodium: 133 mmol/L — ABNORMAL LOW (ref 135–145)
TCO2: 24 mmol/L (ref 22–32)

## 2020-01-17 LAB — GLUCOSE, CAPILLARY: Glucose-Capillary: 128 mg/dL — ABNORMAL HIGH (ref 70–99)

## 2020-01-17 LAB — ETHANOL: Alcohol, Ethyl (B): <10 mg/dL (ref <10)

## 2020-01-17 LAB — CBC
HCT: 42.3 % (ref 39.0–52.0)
Hemoglobin: 13.6 g/dL (ref 13.0–17.0)
MCH: 32.6 pg (ref 26.0–34.0)
MCHC: 32.2 g/dL (ref 30.0–36.0)
MCV: 101.4 fL — ABNORMAL HIGH (ref 80.0–100.0)
Platelets: 234 10*3/uL (ref 150–400)
RBC: 4.17 MIL/uL — ABNORMAL LOW (ref 4.22–5.81)
RDW: 13.5 % (ref 11.5–15.5)
WBC: 8.8 10*3/uL (ref 4.0–10.5)
nRBC: 0 % (ref 0.0–0.2)

## 2020-01-17 LAB — TROPONIN I (HIGH SENSITIVITY): Troponin I (High Sensitivity): 9 ng/L (ref <18)

## 2020-01-17 LAB — LACTIC ACID, PLASMA: Lactic Acid, Venous: 2.3 mmol/L (ref 0.5–1.9)

## 2020-01-17 LAB — SAMPLE TO BLOOD BANK

## 2020-01-17 LAB — SARS CORONAVIRUS 2 BY RT PCR (HOSPITAL ORDER, PERFORMED IN ~~LOC~~ HOSPITAL LAB): SARS Coronavirus 2: NEGATIVE

## 2020-01-17 LAB — BRAIN NATRIURETIC PEPTIDE: B Natriuretic Peptide: 351.6 pg/mL — ABNORMAL HIGH (ref 0.0–100.0)

## 2020-01-17 SURGERY — OPEN REDUCTION INTERNAL FIXATION (ORIF) WRIST FRACTURE
Anesthesia: General | Site: Wrist | Laterality: Left

## 2020-01-17 MED ORDER — LIDOCAINE 2% (20 MG/ML) 5 ML SYRINGE
INTRAMUSCULAR | Status: DC | PRN
  Administered 2020-01-17: 40 mg via INTRAVENOUS

## 2020-01-17 MED ORDER — FENTANYL CITRATE (PF) 100 MCG/2ML IJ SOLN
INTRAMUSCULAR | Status: AC
  Filled 2020-01-17: qty 2

## 2020-01-17 MED ORDER — MIDAZOLAM HCL 5 MG/5ML IJ SOLN
INTRAMUSCULAR | Status: DC | PRN
  Administered 2020-01-17: 2 mg via INTRAVENOUS

## 2020-01-17 MED ORDER — EPHEDRINE SULFATE-NACL 50-0.9 MG/10ML-% IV SOSY
PREFILLED_SYRINGE | INTRAVENOUS | Status: DC | PRN
  Administered 2020-01-17: 10 mg via INTRAVENOUS
  Administered 2020-01-17: 20 mg via INTRAVENOUS

## 2020-01-17 MED ORDER — LIDOCAINE-EPINEPHRINE 1 %-1:100000 IJ SOLN
20.0000 mL | Freq: Once | INTRAMUSCULAR | Status: DC
  Filled 2020-01-17: qty 1

## 2020-01-17 MED ORDER — PROPOFOL 10 MG/ML IV BOLUS
INTRAVENOUS | Status: DC | PRN
  Administered 2020-01-17: 200 mg via INTRAVENOUS

## 2020-01-17 MED ORDER — HYDROXYZINE HCL 25 MG PO TABS: 25.0000 mg | ORAL_TABLET | Freq: Three times a day (TID) | ORAL | Status: DC | PRN

## 2020-01-17 MED ORDER — ALLOPURINOL 300 MG PO TABS
300.0000 mg | ORAL_TABLET | Freq: Every day | ORAL | Status: DC
  Administered 2020-01-18: 300 mg via ORAL
  Filled 2020-01-17: qty 1

## 2020-01-17 MED ORDER — FENTANYL CITRATE (PF) 100 MCG/2ML IJ SOLN
INTRAMUSCULAR | Status: DC | PRN
  Administered 2020-01-17: 50 ug via INTRAVENOUS
  Administered 2020-01-17: 100 ug via INTRAVENOUS

## 2020-01-17 MED ORDER — OXYCODONE HCL 5 MG PO TABS: 10.0000 mg | ORAL_TABLET | ORAL | Status: DC | PRN

## 2020-01-17 MED ORDER — IOHEXOL 300 MG/ML  SOLN
125.0000 mL | Freq: Once | INTRAMUSCULAR | Status: AC | PRN
  Administered 2020-01-17: 125 mL via INTRAVENOUS

## 2020-01-17 MED ORDER — SUCCINYLCHOLINE CHLORIDE 200 MG/10ML IV SOSY
PREFILLED_SYRINGE | INTRAVENOUS | Status: DC | PRN
  Administered 2020-01-17: 140 mg via INTRAVENOUS

## 2020-01-17 MED ORDER — ONDANSETRON HCL 4 MG/2ML IJ SOLN: 4.0000 mg | Freq: Four times a day (QID) | INTRAMUSCULAR | Status: DC | PRN

## 2020-01-17 MED ORDER — ACETAMINOPHEN 325 MG PO TABS: 325.0000 mg | ORAL_TABLET | Freq: Four times a day (QID) | ORAL | Status: DC | PRN

## 2020-01-17 MED ORDER — PREDNISONE 5 MG PO TABS
5.0000 mg | ORAL_TABLET | Freq: Every day | ORAL | Status: DC
  Administered 2020-01-18: 5 mg via ORAL
  Filled 2020-01-17: qty 1

## 2020-01-17 MED ORDER — ASPIRIN EC 325 MG PO TBEC
325.0000 mg | DELAYED_RELEASE_TABLET | Freq: Every day | ORAL | Status: DC
  Administered 2020-01-18: 325 mg via ORAL
  Filled 2020-01-17: qty 1

## 2020-01-17 MED ORDER — SUGAMMADEX SODIUM 200 MG/2ML IV SOLN
INTRAVENOUS | Status: DC | PRN
  Administered 2020-01-17: 200 mg via INTRAVENOUS

## 2020-01-17 MED ORDER — VANCOMYCIN HCL 1000 MG IV SOLR
INTRAVENOUS | Status: DC | PRN
  Administered 2020-01-17: 1000 mg via INTRAVENOUS

## 2020-01-17 MED ORDER — OXYCODONE HCL 5 MG PO TABS
5.0000 mg | ORAL_TABLET | ORAL | Status: DC | PRN
  Administered 2020-01-18: 5 mg via ORAL
  Filled 2020-01-17: qty 1

## 2020-01-17 MED ORDER — 0.9 % SODIUM CHLORIDE (POUR BTL) OPTIME
TOPICAL | Status: DC | PRN
  Administered 2020-01-17: 1000 mL

## 2020-01-17 MED ORDER — ONDANSETRON HCL 4 MG/2ML IJ SOLN
INTRAMUSCULAR | Status: DC | PRN
  Administered 2020-01-17: 4 mg via INTRAVENOUS

## 2020-01-17 MED ORDER — HYDROCHLOROTHIAZIDE 25 MG PO TABS
25.0000 mg | ORAL_TABLET | Freq: Every day | ORAL | Status: DC
  Filled 2020-01-17: qty 1

## 2020-01-17 MED ORDER — LISINOPRIL 20 MG PO TABS
20.0000 mg | ORAL_TABLET | Freq: Every day | ORAL | Status: DC
  Administered 2020-01-18: 20 mg via ORAL
  Filled 2020-01-17: qty 1

## 2020-01-17 MED ORDER — LISINOPRIL-HYDROCHLOROTHIAZIDE 20-25 MG PO TABS: 1.0000 | ORAL_TABLET | Freq: Every day | ORAL | Status: DC

## 2020-01-17 MED ORDER — FENTANYL CITRATE (PF) 100 MCG/2ML IJ SOLN
INTRAMUSCULAR | Status: AC
Start: 2020-01-17 — End: 2020-01-18
  Filled 2020-01-17: qty 2

## 2020-01-17 MED ORDER — HYDROMORPHONE HCL 1 MG/ML IJ SOLN: 0.5000 mg | INTRAMUSCULAR | Status: DC | PRN

## 2020-01-17 MED ORDER — DOXYCYCLINE HYCLATE 100 MG PO TABS: 100.0000 mg | ORAL_TABLET | Freq: Once | ORAL | Status: DC

## 2020-01-17 MED ORDER — PROMETHAZINE HCL 12.5 MG RE SUPP
12.5000 mg | Freq: Four times a day (QID) | RECTAL | Status: DC | PRN
  Filled 2020-01-17: qty 1

## 2020-01-17 MED ORDER — FENTANYL CITRATE (PF) 250 MCG/5ML IJ SOLN
INTRAMUSCULAR | Status: AC
  Filled 2020-01-17: qty 5

## 2020-01-17 MED ORDER — AMISULPRIDE (ANTIEMETIC) 5 MG/2ML IV SOLN: 10.0000 mg | Freq: Once | INTRAVENOUS | Status: DC | PRN

## 2020-01-17 MED ORDER — ACETAMINOPHEN 500 MG PO TABS
1000.0000 mg | ORAL_TABLET | Freq: Four times a day (QID) | ORAL | Status: DC
  Administered 2020-01-18 (×3): 1000 mg via ORAL
  Filled 2020-01-17 (×3): qty 2

## 2020-01-17 MED ORDER — HYDROMORPHONE HCL 1 MG/ML IJ SOLN
0.5000 mg | Freq: Once | INTRAMUSCULAR | Status: AC
  Administered 2020-01-17: 0.5 mg via INTRAVENOUS
  Filled 2020-01-17: qty 1

## 2020-01-17 MED ORDER — BUPIVACAINE HCL (PF) 0.25 % IJ SOLN
INTRAMUSCULAR | Status: AC
  Filled 2020-01-17: qty 30

## 2020-01-17 MED ORDER — BUPIVACAINE HCL (PF) 0.25 % IJ SOLN
INTRAMUSCULAR | Status: DC | PRN
  Administered 2020-01-17: 9 mL

## 2020-01-17 MED ORDER — PHENYLEPHRINE HCL-NACL 10-0.9 MG/250ML-% IV SOLN
INTRAVENOUS | Status: DC | PRN
  Administered 2020-01-17: 25 ug/min via INTRAVENOUS

## 2020-01-17 MED ORDER — PHENYLEPHRINE 40 MCG/ML (10ML) SYRINGE FOR IV PUSH (FOR BLOOD PRESSURE SUPPORT)
PREFILLED_SYRINGE | INTRAVENOUS | Status: DC | PRN
  Administered 2020-01-17 (×2): 120 ug via INTRAVENOUS
  Administered 2020-01-17: 80 ug via INTRAVENOUS
  Administered 2020-01-17: 160 ug via INTRAVENOUS
  Administered 2020-01-17: 80 ug via INTRAVENOUS
  Administered 2020-01-17: 120 ug via INTRAVENOUS

## 2020-01-17 MED ORDER — DOCUSATE SODIUM 100 MG PO CAPS
100.0000 mg | ORAL_CAPSULE | Freq: Two times a day (BID) | ORAL | Status: DC
  Administered 2020-01-18: 100 mg via ORAL
  Filled 2020-01-17: qty 1

## 2020-01-17 MED ORDER — ONDANSETRON HCL 4 MG PO TABS: 4.0000 mg | ORAL_TABLET | Freq: Four times a day (QID) | ORAL | Status: DC | PRN

## 2020-01-17 MED ORDER — ROCURONIUM BROMIDE 10 MG/ML (PF) SYRINGE
PREFILLED_SYRINGE | INTRAVENOUS | Status: DC | PRN
  Administered 2020-01-17: 40 mg via INTRAVENOUS

## 2020-01-17 MED ORDER — FENTANYL CITRATE (PF) 100 MCG/2ML IJ SOLN
25.0000 ug | INTRAMUSCULAR | Status: DC | PRN
  Administered 2020-01-17 (×3): 50 ug via INTRAVENOUS

## 2020-01-17 MED ORDER — LACTATED RINGERS IV SOLN: INTRAVENOUS | Status: DC

## 2020-01-17 MED ORDER — LIDOCAINE 2% (20 MG/ML) 5 ML SYRINGE
INTRAMUSCULAR | Status: AC
  Filled 2020-01-17: qty 5

## 2020-01-17 MED ORDER — AZITHROMYCIN 250 MG PO TABS: 250.0000 mg | ORAL_TABLET | Freq: Every day | ORAL | 0 refills | Status: AC

## 2020-01-17 MED ORDER — VANCOMYCIN HCL IN DEXTROSE 1-5 GM/200ML-% IV SOLN
INTRAVENOUS | Status: AC
  Filled 2020-01-17: qty 200

## 2020-01-17 MED ORDER — PROPOFOL 10 MG/ML IV BOLUS
INTRAVENOUS | Status: AC
  Filled 2020-01-17: qty 20

## 2020-01-17 MED ORDER — ALPRAZOLAM 0.5 MG PO TABS
1.0000 mg | ORAL_TABLET | Freq: Three times a day (TID) | ORAL | Status: DC | PRN
  Administered 2020-01-18: 1 mg via ORAL
  Filled 2020-01-17: qty 2

## 2020-01-17 MED ORDER — DEXAMETHASONE SODIUM PHOSPHATE 10 MG/ML IJ SOLN
INTRAMUSCULAR | Status: DC | PRN
  Administered 2020-01-17: 10 mg via INTRAVENOUS

## 2020-01-17 MED ORDER — GABAPENTIN 100 MG PO CAPS: 100.0000 mg | ORAL_CAPSULE | Freq: Every day | ORAL | Status: DC

## 2020-01-17 MED ORDER — EZETIMIBE 10 MG PO TABS
10.0000 mg | ORAL_TABLET | Freq: Every day | ORAL | Status: DC
  Administered 2020-01-18: 10 mg via ORAL
  Filled 2020-01-17: qty 1

## 2020-01-17 MED ORDER — ROCURONIUM BROMIDE 10 MG/ML (PF) SYRINGE
PREFILLED_SYRINGE | INTRAVENOUS | Status: AC
  Filled 2020-01-17: qty 10

## 2020-01-17 MED ORDER — SUCCINYLCHOLINE CHLORIDE 200 MG/10ML IV SOSY
PREFILLED_SYRINGE | INTRAVENOUS | Status: AC
  Filled 2020-01-17: qty 10

## 2020-01-17 MED ORDER — FAMOTIDINE 20 MG PO TABS: 20.0000 mg | ORAL_TABLET | Freq: Two times a day (BID) | ORAL | Status: DC | PRN

## 2020-01-17 MED ORDER — METHOCARBAMOL 500 MG PO TABS
500.0000 mg | ORAL_TABLET | Freq: Four times a day (QID) | ORAL | Status: DC | PRN
  Administered 2020-01-18 (×2): 500 mg via ORAL
  Filled 2020-01-17 (×2): qty 1

## 2020-01-17 MED ORDER — HYDROMORPHONE HCL 1 MG/ML IJ SOLN
1.0000 mg | Freq: Once | INTRAMUSCULAR | Status: AC
  Administered 2020-01-17: 1 mg via INTRAVENOUS
  Filled 2020-01-17: qty 1

## 2020-01-17 MED ORDER — METHOCARBAMOL 1000 MG/10ML IJ SOLN: 500.0000 mg | Freq: Four times a day (QID) | INTRAVENOUS | Status: DC | PRN

## 2020-01-17 MED ORDER — MIDAZOLAM HCL 2 MG/2ML IJ SOLN
INTRAMUSCULAR | Status: AC
  Filled 2020-01-17: qty 2

## 2020-01-17 MED ORDER — AZITHROMYCIN 250 MG PO TABS: 500.0000 mg | ORAL_TABLET | Freq: Once | ORAL | Status: DC

## 2020-01-17 SURGICAL SUPPLY — 67 items
BIT DRILL 2.2 SS TIBIAL (BIT) ×4 IMPLANT
BLADE CLIPPER SURG (BLADE) IMPLANT
BNDG ELASTIC 3X5.8 VLCR STR LF (GAUZE/BANDAGES/DRESSINGS) ×2 IMPLANT
BNDG ELASTIC 4X5.8 VLCR STR LF (GAUZE/BANDAGES/DRESSINGS) ×2 IMPLANT
BNDG ESMARK 4X9 LF (GAUZE/BANDAGES/DRESSINGS) ×2 IMPLANT
BNDG GAUZE ELAST 4 BULKY (GAUZE/BANDAGES/DRESSINGS) ×2 IMPLANT
CANISTER SUCT 3000ML PPV (MISCELLANEOUS) ×2 IMPLANT
CORD BIPOLAR FORCEPS 12FT (ELECTRODE) ×2 IMPLANT
COVER SURGICAL LIGHT HANDLE (MISCELLANEOUS) ×2 IMPLANT
COVER WAND RF STERILE (DRAPES) ×2 IMPLANT
CUFF TOURN SGL QUICK 18X4 (TOURNIQUET CUFF) ×2 IMPLANT
CUFF TOURN SGL QUICK 24 (TOURNIQUET CUFF)
CUFF TRNQT CYL 24X4X16.5-23 (TOURNIQUET CUFF) IMPLANT
DRAIN TLS ROUND 10FR (DRAIN) IMPLANT
DRAPE OEC MINIVIEW 54X84 (DRAPES) ×2 IMPLANT
DRAPE SURG 17X23 STRL (DRAPES) ×2 IMPLANT
GAUZE SPONGE 4X4 12PLY STRL (GAUZE/BANDAGES/DRESSINGS) ×2 IMPLANT
GAUZE XEROFORM 1X8 LF (GAUZE/BANDAGES/DRESSINGS) ×2 IMPLANT
GAUZE XEROFORM 5X9 LF (GAUZE/BANDAGES/DRESSINGS) ×2 IMPLANT
GLOVE SS BIOGEL STRL SZ 8 (GLOVE) ×1 IMPLANT
GLOVE SUPERSENSE BIOGEL SZ 8 (GLOVE) ×1
GOWN STRL REUS W/ TWL LRG LVL3 (GOWN DISPOSABLE) ×1 IMPLANT
GOWN STRL REUS W/ TWL XL LVL3 (GOWN DISPOSABLE) ×1 IMPLANT
GOWN STRL REUS W/TWL LRG LVL3 (GOWN DISPOSABLE) ×2
GOWN STRL REUS W/TWL XL LVL3 (GOWN DISPOSABLE) ×2
K-WIRE 1.6 (WIRE) ×2
K-WIRE FX5X1.6XNS BN SS (WIRE) ×1
KIT BASIN OR (CUSTOM PROCEDURE TRAY) ×2 IMPLANT
KIT TURNOVER KIT B (KITS) ×2 IMPLANT
KWIRE FX5X1.6XNS BN SS (WIRE) ×1 IMPLANT
LOOP VESSEL MAXI BLUE (MISCELLANEOUS) IMPLANT
MANIFOLD NEPTUNE II (INSTRUMENTS) ×2 IMPLANT
NEEDLE 22X1 1/2 (OR ONLY) (NEEDLE) IMPLANT
NS IRRIG 1000ML POUR BTL (IV SOLUTION) ×2 IMPLANT
PACK ORTHO EXTREMITY (CUSTOM PROCEDURE TRAY) ×2 IMPLANT
PAD ARMBOARD 7.5X6 YLW CONV (MISCELLANEOUS) ×4 IMPLANT
PAD CAST 3X4 CTTN HI CHSV (CAST SUPPLIES) ×1 IMPLANT
PAD CAST 4YDX4 CTTN HI CHSV (CAST SUPPLIES) ×1 IMPLANT
PADDING CAST ABS 3INX4YD NS (CAST SUPPLIES) ×1
PADDING CAST ABS COTTON 3X4 (CAST SUPPLIES) ×1 IMPLANT
PADDING CAST COTTON 3X4 STRL (CAST SUPPLIES) ×2
PADDING CAST COTTON 4X4 STRL (CAST SUPPLIES) ×2
PEG LOCKING SMOOTH 2.2X20 (Screw) ×2 IMPLANT
PEG LOCKING SMOOTH 2.2X22 (Screw) ×2 IMPLANT
PEG LOCKING SMOOTH 2.2X26 (Peg) ×2 IMPLANT
PLATE MEDIUM DVR LEFT (Plate) ×2 IMPLANT
SCREW LOCK 16X2.7X 3 LD TPR (Screw) ×1 IMPLANT
SCREW LOCK 18X2.7X 3 LD TPR (Screw) ×1 IMPLANT
SCREW LOCK 22X2.7X 3 LD TPR (Screw) ×4 IMPLANT
SCREW LOCKING 2.7X15MM (Screw) ×8 IMPLANT
SCREW LOCKING 2.7X16 (Screw) ×2 IMPLANT
SCREW LOCKING 2.7X18 (Screw) ×2 IMPLANT
SCREW LOCKING 2.7X22MM (Screw) ×4 IMPLANT
SOL PREP POV-IOD 4OZ 10% (MISCELLANEOUS) ×4 IMPLANT
SPONGE LAP 4X18 RFD (DISPOSABLE) IMPLANT
SUT MNCRL AB 4-0 PS2 18 (SUTURE) IMPLANT
SUT PROLENE 3 0 PS 2 (SUTURE) ×6 IMPLANT
SUT PROLENE 4 0 PS 2 18 (SUTURE) ×2 IMPLANT
SUT VIC AB 3-0 FS2 27 (SUTURE) ×2 IMPLANT
SYR CONTROL 10ML LL (SYRINGE) IMPLANT
SYSTEM CHEST DRAIN TLS 7FR (DRAIN) IMPLANT
TOWEL GREEN STERILE (TOWEL DISPOSABLE) ×2 IMPLANT
TOWEL GREEN STERILE FF (TOWEL DISPOSABLE) ×2 IMPLANT
TUBE CONNECTING 12X1/4 (SUCTIONS) ×2 IMPLANT
TUBE EVACUATION TLS (MISCELLANEOUS) ×2 IMPLANT
UNDERPAD 30X36 HEAVY ABSORB (UNDERPADS AND DIAPERS) ×2 IMPLANT
WATER STERILE IRR 1000ML POUR (IV SOLUTION) ×2 IMPLANT
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
## 2020-01-17 NOTE — Op Note (Signed)
Operative note 01/17/2020  Roseanne Kaufman MD  Preoperative diagnosis left closed Galeazzi type fracture/distal radius fracture left upper extremity. Lacerations left dorsal hand  Postop diagnosis: Same  Operative procedure #1 open reduction internal fixation with DVR plate and screw construct left distal radius fracture #2 closed reduction with stability noted about the distal radial ulnar joint left upper extremity #3 AP lateral oblique x-rays performed examined and interpreted by myself left wrist and forearm #4 irrigation and debridement 3 separate lacerations with exploration and noted intact tendon architecture without obvious bony injury left hand and index finger MCP joint   Surgeon Roseanne Kaufman  Anesthesia Per anesthetic record/general anesthesia  Estimated blood loss minimal  Drains none  Description of procedure this patient has multiple facial and scalp lacerations attended to by the emergency department. His left upper extremity has a closed distal radius fracture. There is a questionable Galeazzi component.  He is consented for surgical intervention.  Patient was taken to the procedure suite underwent a Hibiclens scrub x2 followed by Betadine scrub and paint. This was performed after anesthesia was induced.  Timeout was observed sterile field secured and the operation commenced with elevation of the tourniquet followed by volar radial incision. Dissection was carried down carefully and the patient then underwent very careful and cautious approach to the extremity with the FCR being incised dorsally and palmarly followed by retraction of the carpal canal contents ulnarly followed by access to the fracture the fracture was then evaluated. He had a comminuted complex intra-articular distal radius fracture with some shaft extension. Primarily metaphyseal region was involved. I reduced the distal radial ulnar joint and the distal radius. Following close reduction of these areas I  then performed the open reduction of the distal radius fracture and applied a extended plate and screw from [employer].  Radial height inclination and volar tilt were recreated nicely and all went quite well. The patient tolerated this nicely. Once this was complete we then irrigated copiously closed the pronator and took final copy x-rays. I did perform a sliding brachial radialis tonight made to lessen deforming forces.  This was done during the initial dissection and reduction.  Following this I irrigated and explored 3 separate lacerations about the dorsal hand which had been covered during the operation to prevent cross-contamination. The tendon architecture was intact and these were sutured after thorough exploration.  Once the pronator was closed in the volar incision the skin edge was closed with Prolene and a sterile dressing with sugar tong type apparatus was placed. Excellent refill soft compartments and no complications were noted.  He'll be admitted for IV antibiotics in the form of vancomycin due to penicillin allergy and I'll see him daily while in the hospital to take extra precautions and good care of this gentleman. I should note the distal radial ulnar joint was very stable following ORIF of the radius in pronation supination and neutral and there is no gross disruption of the DRUJ. Thus no pinning of the joint or other measures were performed here All questions have been encouraged and answered.  Avory Mimbs MD

## 2020-01-17 NOTE — ED Notes (Signed)
Short stay medical request pt OTF

## 2020-01-17 NOTE — Transfer of Care (Signed)
Immediate Anesthesia Transfer of Care Note  Patient: Sean Maldonado  Procedure(s) Performed: OPEN REDUCTION INTERNAL FIXATION (ORIF) WRIST FRACTURE (Left Wrist)  Patient Location: PACU  Anesthesia Type:General  Level of Consciousness: awake, alert  and oriented  Airway & Oxygen Therapy: Patient connected to face mask oxygen  Post-op Assessment: Report given to RN and Post -op Vital signs reviewed and stable  Post vital signs: Reviewed and stable  Last Vitals:  Vitals Value Taken Time  BP    Temp 36.3 C 01/17/20 2205  Pulse    Resp    SpO2      Last Pain:  Vitals:   01/17/20 1539  TempSrc:   PainSc: 4          Complications: No complications documented.

## 2020-01-17 NOTE — Anesthesia Procedure Notes (Signed)
Procedure Name: Intubation Date/Time: 01/17/2020 8:34 PM Performed by: Moshe Salisbury, CRNA Pre-anesthesia Checklist: Patient identified, Emergency Drugs available, Suction available and Patient being monitored Patient Re-evaluated:Patient Re-evaluated prior to induction Oxygen Delivery Method: Circle System Utilized Preoxygenation: Pre-oxygenation with 100% oxygen Induction Type: IV induction Ventilation: Mask ventilation without difficulty Laryngoscope Size: Glidescope and 4 Tube type: Oral Tube size: 8.0 mm Number of attempts: 1 Airway Equipment and Method: Rigid stylet and Video-laryngoscopy Placement Confirmation: ETT inserted through vocal cords under direct vision,  positive ETCO2 and breath sounds checked- equal and bilateral Secured at: 23 cm Tube secured with: Tape Dental Injury: Teeth and Oropharynx as per pre-operative assessment
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
## 2020-01-17 NOTE — Anesthesia Preprocedure Evaluation (Addendum)
Anesthesia Evaluation  Patient identified by MRN, date of birth, ID band Patient awake    Reviewed: Allergy & Precautions, NPO status , Patient's Chart, lab work & pertinent test results  Airway Mallampati: IV  TM Distance: >3 FB Neck ROM: Limited    Dental  (+) Dental Advisory Given, Teeth Intact   Pulmonary asthma ,    breath sounds clear to auscultation       Cardiovascular hypertension, Pt. on medications and Pt. on home beta blockers  Rhythm:Regular Rate:Normal     Neuro/Psych negative neurological ROS     GI/Hepatic GERD  ,(+) Hepatitis -  Endo/Other  diabetes  Renal/GU Renal disease     Musculoskeletal   Abdominal   Peds  Hematology negative hematology ROS (+)   Anesthesia Other Findings   Reproductive/Obstetrics                            Anesthesia Physical Anesthesia Plan  ASA: II and emergent  Anesthesia Plan: General   Post-op Pain Management:    Induction: Intravenous and Rapid sequence  PONV Risk Score and Plan: 2 and Dexamethasone, Ondansetron and Treatment may vary due to age or medical condition  Airway Management Planned: Oral ETT  Additional Equipment: None  Intra-op Plan:   Post-operative Plan: Extubation in OR  Informed Consent: I have reviewed the patients History and Physical, chart, labs and discussed the procedure including the risks, benefits and alternatives for the proposed anesthesia with the patient or authorized representative who has indicated his/her understanding and acceptance.     Dental advisory given  Plan Discussed with: CRNA  Anesthesia Plan Comments:         Anesthesia Quick Evaluation
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
## 2020-01-17 NOTE — ED Notes (Signed)
Pre op 36 is ready for pt

## 2020-01-18 MED ORDER — VANCOMYCIN HCL IN DEXTROSE 1-5 GM/200ML-% IV SOLN
1000.0000 mg | Freq: Two times a day (BID) | INTRAVENOUS | Status: DC
  Administered 2020-01-18: 1000 mg via INTRAVENOUS
  Filled 2020-01-18 (×2): qty 200

## 2020-01-18 MED ORDER — OXYCODONE HCL 5 MG PO TABS: 5.0000 mg | ORAL_TABLET | ORAL | 0 refills | Status: DC | PRN

## 2020-01-18 MED ORDER — ONDANSETRON HCL 4 MG PO TABS: 4.0000 mg | ORAL_TABLET | Freq: Every day | ORAL | 1 refills | Status: AC | PRN

## 2020-01-18 MED ORDER — METHOCARBAMOL 500 MG PO TABS: 500.0000 mg | ORAL_TABLET | Freq: Four times a day (QID) | ORAL | 0 refills | Status: DC

## 2020-01-18 NOTE — Progress Notes (Signed)
Orthopedic Tech Progress Note Patient Details:  Sean Maldonado 1955-09-10 [id number]  Ortho Devices Type of Ortho Device: Arm sling Ortho Device/Splint Location: delivered to rn Ortho Device/Splint Interventions: Renard Matter 01/18/2020, 6:54 AM
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
## 2020-01-18 NOTE — Anesthesia Postprocedure Evaluation (Signed)
Anesthesia Post Note  Patient: Sean Maldonado  Procedure(s) Performed: OPEN REDUCTION INTERNAL FIXATION (ORIF) WRIST FRACTURE (Left Wrist)     Patient location during evaluation: PACU Anesthesia Type: General Level of consciousness: awake and alert Pain management: pain level controlled Vital Signs Assessment: post-procedure vital signs reviewed and stable Respiratory status: spontaneous breathing, nonlabored ventilation, respiratory function stable and patient connected to nasal cannula oxygen Cardiovascular status: blood pressure returned to baseline and stable Postop Assessment: no apparent nausea or vomiting Anesthetic complications: no   No complications documented.  Last Vitals:  Vitals:   01/17/20 2336 01/18/20 0210  BP: 134/85 126/81  Pulse: 87 79  Resp: 20 20  Temp: 37.2 C 37.4 C  SpO2: 95% 95%    Last Pain:  Vitals:   01/18/20 0210  TempSrc: Oral  PainSc:                  [name]

## 2020-01-18 NOTE — Discharge Instructions (Signed)
Please take your Z-Pak as prescribed by the emergency room department.  Your medicines have been called into a [place] [facility].  Please call for any problems.  Keep bandage clean and dry.  Call for any problems.  No smoking.  Criteria for driving a car: you should be off your pain medicine for 7-8 hours, able to drive one handed(confident), thinking clearly and feeling able in your judgement to drive. Continue elevation as it will decrease swelling.  If instructed by MD move your fingers within the confines of the bandage/splint.  Use ice if instructed by your MD. Call immediately for any sudden loss of feeling in your hand/arm or change in functional abilities of the extremity.We recommend that you to take vitamin C 1000 mg a day to promote healing. We also recommend that if you require  pain medicine that you take a stool softener to prevent constipation as most pain medicines will have constipation side effects. We recommend either Peri-Colace or Senokot and recommend that you also consider adding MiraLAX as well to prevent the constipation affects from pain medicine if you are required to use them. These medicines are over the counter and may be purchased at a local pharmacy. A cup of yogurt and a probiotic can also be helpful during the recovery process as the medicines can disrupt your intestinal environment.
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
## 2020-01-21 ENCOUNTER — Encounter (HOSPITAL_COMMUNITY): Payer: Self-pay | Admitting: Orthopedic Surgery

## 2020-01-28 DIAGNOSIS — S52372D Galeazzi's fracture of left radius, subsequent encounter for closed fracture with routine healing: Secondary | ICD-10-CM | POA: Diagnosis not present

## 2020-02-13 DIAGNOSIS — M25632 Stiffness of left wrist, not elsewhere classified: Secondary | ICD-10-CM | POA: Diagnosis not present

## 2020-02-13 DIAGNOSIS — S52372D Galeazzi's fracture of left radius, subsequent encounter for closed fracture with routine healing: Secondary | ICD-10-CM | POA: Diagnosis not present

## 2020-02-21 DIAGNOSIS — M25631 Stiffness of right wrist, not elsewhere classified: Secondary | ICD-10-CM | POA: Diagnosis not present

## 2020-02-21 DIAGNOSIS — M25641 Stiffness of right hand, not elsewhere classified: Secondary | ICD-10-CM | POA: Diagnosis not present

## 2020-02-25 DIAGNOSIS — M25532 Pain in left wrist: Secondary | ICD-10-CM | POA: Diagnosis not present

## 2020-02-28 DIAGNOSIS — M25532 Pain in left wrist: Secondary | ICD-10-CM | POA: Diagnosis not present

## 2020-03-03 DIAGNOSIS — M25532 Pain in left wrist: Secondary | ICD-10-CM | POA: Diagnosis not present

## 2020-03-05 DIAGNOSIS — S52372D Galeazzi's fracture of left radius, subsequent encounter for closed fracture with routine healing: Secondary | ICD-10-CM | POA: Diagnosis not present

## 2020-03-05 DIAGNOSIS — M25532 Pain in left wrist: Secondary | ICD-10-CM | POA: Diagnosis not present

## 2020-03-09 DIAGNOSIS — M3219 Other organ or system involvement in systemic lupus erythematosus: Secondary | ICD-10-CM | POA: Diagnosis not present

## 2020-03-09 DIAGNOSIS — M329 Systemic lupus erythematosus, unspecified: Secondary | ICD-10-CM | POA: Diagnosis not present

## 2020-03-11 ENCOUNTER — Other Ambulatory Visit: Payer: Self-pay

## 2020-03-11 ENCOUNTER — Encounter: Payer: Self-pay | Admitting: Internal Medicine

## 2020-03-11 ENCOUNTER — Ambulatory Visit (INDEPENDENT_AMBULATORY_CARE_PROVIDER_SITE_OTHER): Payer: BC Managed Care – PPO | Admitting: Internal Medicine

## 2020-03-11 VITALS — BP 120/82 | HR 77 | Temp 97.5°F | Resp 16 | Ht 70.5 in | Wt 258.2 lb

## 2020-03-11 DIAGNOSIS — E785 Hyperlipidemia, unspecified: Secondary | ICD-10-CM | POA: Diagnosis not present

## 2020-03-11 DIAGNOSIS — Z23 Encounter for immunization: Secondary | ICD-10-CM | POA: Diagnosis not present

## 2020-03-11 DIAGNOSIS — I10 Essential (primary) hypertension: Secondary | ICD-10-CM | POA: Diagnosis not present

## 2020-03-11 DIAGNOSIS — K219 Gastro-esophageal reflux disease without esophagitis: Secondary | ICD-10-CM

## 2020-03-11 DIAGNOSIS — M1000 Idiopathic gout, unspecified site: Secondary | ICD-10-CM | POA: Diagnosis not present

## 2020-03-11 DIAGNOSIS — E1122 Type 2 diabetes mellitus with diabetic chronic kidney disease: Secondary | ICD-10-CM | POA: Diagnosis not present

## 2020-03-11 DIAGNOSIS — E1169 Type 2 diabetes mellitus with other specified complication: Secondary | ICD-10-CM | POA: Diagnosis not present

## 2020-03-11 DIAGNOSIS — E559 Vitamin D deficiency, unspecified: Secondary | ICD-10-CM | POA: Diagnosis not present

## 2020-03-11 DIAGNOSIS — N182 Chronic kidney disease, stage 2 (mild): Secondary | ICD-10-CM

## 2020-03-11 DIAGNOSIS — Z79899 Other long term (current) drug therapy: Secondary | ICD-10-CM

## 2020-03-11 DIAGNOSIS — M25642 Stiffness of left hand, not elsewhere classified: Secondary | ICD-10-CM | POA: Diagnosis not present

## 2020-03-11 NOTE — Patient Instructions (Signed)

## 2020-03-11 NOTE — Progress Notes (Signed)
History of Present Illness:       This very nice 64 y.o.male presents for 3 month follow up with HTN, HLD, Pre-Diabetes and Vitamin D Deficiency.       Patient is treated for HTN & BP has been controlled at home. Today's BP is at goal -  120/82. Patient has had no complaints of any cardiac type chest pain, palpitations, dyspnea / orthopnea / PND, dizziness, claudication, or dependent edema.      Hyperlipidemia is controlled with diet & meds. Patient denies myalgias or other med SE's. Last Lipids were not at goal:  Lab Results  Component Value Date   CHOL 179 12/04/2019   HDL 26 (L) 12/04/2019   LDLCALC 128 (H) 12/04/2019   TRIG 141 12/04/2019   CHOLHDL 6.9 (H) 12/04/2019    Also, the patient has history of PreDiabetes and has had no symptoms of reactive hypoglycemia, diabetic polys, paresthesias or visual blurring.  Last A1c was not at goal:  Lab Results  Component Value Date   HGBA1C 6.5 (H) 12/04/2019       Further, the patient also has history of Vitamin D Deficiency and supplements vitamin D without any suspected side-effects. Last vitamin D was at goal:   Lab Results  Component Value Date   VD25OH 63 09/04/2019    Current Outpatient Medications on File Prior to Visit  Medication Sig  . allopurinol 300 MG TAKE 1 TABLET DAILY   . ALPRAZolam 1 MG tablet TAKE 1/2-1 TAB 2-3 Tx /day as needed   . aspirin EC 81 MG tablet Take  daily.   Marland Kitchen atenolol  100 MG tablet Take 1/2  tablet Daily for BP   . VITAMIN D 5000 U Take 10,000 Units by mouth daily.   Marland Kitchen ezetimibe  10 MG tablet TAKE 1 TABLET DAILY   . gabapentin (100 MG capsule Take 1 capsule 2 to 3 x /day as needed   . hydrOXYzine 25 MG tablet TAKE 2 TABLETS 3 x /day as needed -itching  . lisinopril-hctz 20-25 MG tablet TAKE 1 TABLET ONCE DAILY   . Magnesium 400 MG TABS Take 1 tablet  daily.  . methocarbamol 500 MG tablet Take 1 tablet  4  times daily.  Marland Kitchen MILK THISTLE  Take 1 tablet  daily.  . Multiple Vitamin  Take 1  capsule  daily.  . Omega-3 FISH OIL Take 1 capsule  daily.  . ondansetron  4 MG tablet Take 1 tablet  daily as needed   . Alphalophoric Acid  Takes 1 capsule daily  . predniSONE 10 MG tablet TAKE 1 TABLETS DAILY   . Probiotic  Take 1 capsule b daily.  Marland Kitchen triamcinolone cream  0.1 % Apply  3  times daily.   . TURMERIC PO Take 2,000 mg  daily.     Allergies  Allergen Reactions  . Doxycycline     Lupus flair  . Imuran [Azathioprine] Rash  . Lipitor [Atorvastatin]     Myalgias   . Penicillins     Causes lupus flare ups  . Rosuvastatin Diarrhea  . Zocor [Simvastatin]     Myalgias    PMHx:   Past Medical History:  Diagnosis Date  . Asthma   . GERD    . Hypertension   . Lupus ([place])   . Prediabetes   . Seasonal allergies   . Vitamin D deficiency     Immunization History  Administered Date(s) Administered  . Influenza Inj  Mdck Quad With Preservative 03/04/2019  . Influenza Split 02/26/2013, 02/09/2015  . PPD Test 07/22/2013, 07/23/2014, 07/24/2015, 08/14/2017, 08/27/2018, 09/04/2019  . Pneumococcal Polysaccharide-23 05/22/2008  . Tdap 07/18/2012    Past Surgical History:  Procedure Laterality Date  . AXILLARY LYMPH NODE BIOPSY Right 08/27/2012   Procedure: AXILLARY LYMPH NODE BIOPSY ;  Surgeon: Rolm Bookbinder, MD;  Location: [facility];  Service: General;  Laterality: Right;  . CHOLECYSTECTOMY N/A 12/28/2012   Procedure: LAPAROSCOPIC CHOLECYSTECTOMY WITH INTRAOPERATIVE CHOLANGIOGRAM;  Surgeon: Joyice Faster. Cornett, MD;  Location: [facility];  Service: General;  Laterality: N/A;  . ORIF WRIST FRACTURE Left 01/17/2020   Procedure: OPEN REDUCTION INTERNAL FIXATION (ORIF) WRIST FRACTURE;  Surgeon: Roseanne Kaufman, MD;  Location: [facility];  Service: Orthopedics;  Laterality: Left;  . TONSILLECTOMY    . WISDOM TOOTH EXTRACTION      FHx:    Reviewed / unchanged  SHx:    Reviewed / unchanged   Systems Review:  Constitutional: Denies fever, chills, wt changes, headaches, insomnia,  fatigue, night sweats, change in appetite. Eyes: Denies redness, blurred vision, diplopia, discharge, itchy, watery eyes.  ENT: Denies discharge, congestion, post nasal drip, epistaxis, sore throat, earache, hearing loss, dental pain, tinnitus, vertigo, sinus pain, snoring.  CV: Denies chest pain, palpitations, irregular heartbeat, syncope, dyspnea, diaphoresis, orthopnea, PND, claudication or edema. Respiratory: denies cough, dyspnea, DOE, pleurisy, hoarseness, laryngitis, wheezing.  Gastrointestinal: Denies dysphagia, odynophagia, heartburn, reflux, water brash, abdominal pain or cramps, nausea, vomiting, bloating, diarrhea, constipation, hematemesis, melena, hematochezia  or hemorrhoids. Genitourinary: Denies dysuria, frequency, urgency, nocturia, hesitancy, discharge, hematuria or flank pain. Musculoskeletal: Denies arthralgias, myalgias, stiffness, jt. swelling, pain, limping or strain/sprain.  Skin: Denies pruritus, rash, hives, warts, acne, eczema or change in skin lesion(s). Neuro: No weakness, tremor, incoordination, spasms, paresthesia or pain. Psychiatric: Denies confusion, memory loss or sensory loss. Endo: Denies change in weight, skin or hair change.  Heme/Lymph: No excessive bleeding, bruising or enlarged lymph nodes.  Physical Exam  BP 120/82   Pulse 77   Temp (!) 97.5 F (36.4 C)   Resp 16   Ht 5' 10.5" (1.791 m)   Wt 258 lb 3.2 oz (117.1 kg)   SpO2 97%   BMI 36.52 kg/m   Appears  well nourished, well groomed  and in no distress.  Eyes: PERRLA, EOMs, conjunctiva no swelling or erythema. Sinuses: No frontal/maxillary tenderness ENT/Mouth: EAC's clear, TM's nl w/o erythema, bulging. Nares clear w/o erythema, swelling, exudates. Oropharynx clear without erythema or exudates. Oral hygiene is good. Tongue normal, non obstructing. Hearing intact.  Neck: Supple. Thyroid not palpable. Car 2+/2+ without bruits, nodes or JVD. Chest: Respirations nl with BS clear & equal w/o  rales, rhonchi, wheezing or stridor.  Cor: Heart sounds normal w/ regular rate and rhythm without sig. murmurs, gallops, clicks or rubs. Peripheral pulses normal and equal  without edema.  Abdomen: Soft & bowel sounds normal. Non-tender w/o guarding, rebound, hernias, masses or organomegaly.  Lymphatics: Unremarkable.  Musculoskeletal: Full ROM all peripheral extremities, joint stability, 5/5 strength and normal gait.  Skin: Warm, dry without exposed rashes, lesions or ecchymosis apparent.  Neuro: Cranial nerves intact, reflexes equal bilaterally. Sensory-motor testing grossly intact. Tendon reflexes grossly intact.  Pysch: Alert & oriented x 3.  Insight and judgement nl & appropriate. No ideations.  Assessment and Plan:  1. Essential hypertension  - Continue medication, monitor blood pressure at home.  - Continue DASH diet.  Reminder to go to the ER if any CP,  SOB,  nausea, dizziness, severe HA, changes vision/speech.  - CBC with Differential/Platelet - COMPLETE METABOLIC PANEL WITH GFR - Magnesium - TSH - Urinalysis, Routine w reflex microscopic  2. Hyperlipidemia associated with type 2 diabetes mellitus ([place])  - Continue diet/meds, exercise,& lifestyle modifications.  - Continue monitor periodic cholesterol/liver & renal functions   - Lipid panel - TSH  3. Type 2 diabetes mellitus with stage 2 chronic kidney  disease, without long-term current use of insulin ([name])  - Continue diet, exercise  - Lifestyle modifications.  - Monitor appropriate labs.  - Hemoglobin A1c - Insulin, random - Urinalysis, Routine w reflex microscopic  4. Vitamin D deficiency  - Continue supplementation.  - VITAMIN D 25 Hydroxy   5. Idiopathic gout  - Uric acid  6. Gastroesophageal reflux disease  - CBC with Differential/Platelet  7. Need for immunization against influenza  - FLU VACCINE MDCK QUAD W/Preservative  8. Medication management  - CBC with Differential/Platelet -  COMPLETE METABOLIC PANEL WITH GFR - Magnesium - Lipid panel - TSH - Hemoglobin A1c - Insulin, random - VITAMIN D 25 Hydroxy  - Uric acid - Urinalysis, Routine w reflex microscopic             Discussed  regular exercise, BP monitoring, weight control to achieve/maintain BMI less than 25 and discussed med and SE's. Recommended labs to assess and monitor clinical status with further disposition pending results of labs.  I discussed the assessment and treatment plan with the patient. The patient was provided an opportunity to ask questions and all were answered. The patient agreed with the plan and demonstrated an understanding of the instructions.  I provided over 30 minutes of exam, counseling, chart review and  complex critical decision making.   Kirtland Bouchard, MD

## 2020-03-12 ENCOUNTER — Other Ambulatory Visit: Payer: Self-pay | Admitting: Internal Medicine

## 2020-03-12 DIAGNOSIS — T466X5A Adverse effect of antihyperlipidemic and antiarteriosclerotic drugs, initial encounter: Secondary | ICD-10-CM

## 2020-03-12 DIAGNOSIS — E1169 Type 2 diabetes mellitus with other specified complication: Secondary | ICD-10-CM

## 2020-03-12 DIAGNOSIS — G720 Drug-induced myopathy: Secondary | ICD-10-CM

## 2020-03-12 LAB — MAGNESIUM: Magnesium: 1.9 mg/dL (ref 1.5–2.5)

## 2020-03-12 LAB — CBC WITH DIFFERENTIAL/PLATELET
Absolute Monocytes: 686 cells/uL (ref 200–950)
Basophils Absolute: 39 cells/uL (ref 0–200)
Basophils Relative: 0.5 %
Eosinophils Absolute: 140 cells/uL (ref 15–500)
Eosinophils Relative: 1.8 %
HCT: 43 % (ref 38.5–50.0)
Hemoglobin: 14.6 g/dL (ref 13.2–17.1)
Lymphs Abs: 1069 cells/uL (ref 850–3900)
MCH: 33 pg (ref 27.0–33.0)
MCHC: 34 g/dL (ref 32.0–36.0)
MCV: 97.3 fL (ref 80.0–100.0)
MPV: 10.5 fL (ref 7.5–12.5)
Monocytes Relative: 8.8 %
Neutro Abs: 5866 cells/uL (ref 1500–7800)
Neutrophils Relative %: 75.2 %
Platelets: 240 10*3/uL (ref 140–400)
RBC: 4.42 10*6/uL (ref 4.20–5.80)
RDW: 12.2 % (ref 11.0–15.0)
Total Lymphocyte: 13.7 %
WBC: 7.8 10*3/uL (ref 3.8–10.8)

## 2020-03-12 LAB — URINALYSIS, ROUTINE W REFLEX MICROSCOPIC
Bilirubin Urine: NEGATIVE
Glucose, UA: NEGATIVE
Hgb urine dipstick: NEGATIVE
Ketones, ur: NEGATIVE
Leukocytes,Ua: NEGATIVE
Nitrite: NEGATIVE
Protein, ur: NEGATIVE
Specific Gravity, Urine: 1.017 (ref 1.001–1.03)
pH: 6.5 (ref 5.0–8.0)

## 2020-03-12 LAB — LIPID PANEL
Cholesterol: 192 mg/dL (ref <200)
HDL: 28 mg/dL — ABNORMAL LOW (ref >=40)
LDL Cholesterol (Calc): 133 mg/dL (calc) — ABNORMAL HIGH
Non-HDL Cholesterol (Calc): 164 mg/dL (calc) — ABNORMAL HIGH (ref <130)
Total CHOL/HDL Ratio: 6.9 (calc) — ABNORMAL HIGH (ref <5.0)
Triglycerides: 171 mg/dL — ABNORMAL HIGH (ref <150)

## 2020-03-12 LAB — COMPLETE METABOLIC PANEL WITH GFR
AG Ratio: 1.5 (calc) (ref 1.0–2.5)
ALT: 79 U/L — ABNORMAL HIGH (ref 9–46)
AST: 76 U/L — ABNORMAL HIGH (ref 10–35)
Albumin: 4.2 g/dL (ref 3.6–5.1)
Alkaline phosphatase (APISO): 66 U/L (ref 35–144)
BUN: 13 mg/dL (ref 7–25)
CO2: 29 mmol/L (ref 20–32)
Calcium: 9.8 mg/dL (ref 8.6–10.3)
Chloride: 100 mmol/L (ref 98–110)
Creat: 0.96 mg/dL (ref 0.70–1.25)
GFR, Est African American: 97 mL/min/{1.73_m2} (ref >=60)
GFR, Est Non African American: 84 mL/min/{1.73_m2} (ref >=60)
Globulin: 2.8 g/dL (calc) (ref 1.9–3.7)
Glucose, Bld: 130 mg/dL — ABNORMAL HIGH (ref 65–99)
Potassium: 4.6 mmol/L (ref 3.5–5.3)
Sodium: 139 mmol/L (ref 135–146)
Total Bilirubin: 0.9 mg/dL (ref 0.2–1.2)
Total Protein: 7 g/dL (ref 6.1–8.1)

## 2020-03-12 LAB — INSULIN, RANDOM: Insulin: 25.3 u[IU]/mL — ABNORMAL HIGH

## 2020-03-12 LAB — HEMOGLOBIN A1C
Hgb A1c MFr Bld: 6.3 % of total Hgb — ABNORMAL HIGH (ref <5.7)
Mean Plasma Glucose: 134 (calc)
eAG (mmol/L): 7.4 (calc)

## 2020-03-12 LAB — URIC ACID: Uric Acid, Serum: 4.9 mg/dL (ref 4.0–8.0)

## 2020-03-12 LAB — VITAMIN D 25 HYDROXY (VIT D DEFICIENCY, FRACTURES): Vit D, 25-Hydroxy: 84 ng/mL (ref 30–100)

## 2020-03-12 LAB — TSH: TSH: 2.95 mIU/L (ref 0.40–4.50)

## 2020-03-12 MED ORDER — NEXLETOL 180 MG PO TABS: ORAL_TABLET | ORAL | 1 refills | Status: DC

## 2020-03-12 NOTE — Progress Notes (Signed)
========================================================== ==========================================================  -    Liver enzymes still a little elevated, So avoid alcohol  ==========================================================  -  Total Chol = 192 - elevated  (Ideal or Goal is less than 180)  - and   - Bad  /Dangerous LDL Chol = 133 - very elevated  (Ideal or Goal is less than 70 )  - and increased risk for blindness, heart attack, stroke, kidney failure, impotence, gangrene of leg   So, Sent in a new med - - - - Nexletol to take with your Zetia - It is NOT a Statin - It will probably require prior approval from your Ins Co ==========================================================  -  A1c = 6.3%  - - - - Still too high  (Ideal or goal is less than 5.7%) ==========================================================  -  Vitamin D = 84 - Excellent  ==========================================================  -  All Else - CBC - Kidneys - Electrolytes - Liver - Magnesium & Thyroid    - all  Normal / OK ==========================================================  ==========================================================

## 2020-03-16 ENCOUNTER — Other Ambulatory Visit: Payer: Self-pay | Admitting: Internal Medicine

## 2020-03-16 DIAGNOSIS — G63 Polyneuropathy in diseases classified elsewhere: Secondary | ICD-10-CM

## 2020-03-19 DIAGNOSIS — M25632 Stiffness of left wrist, not elsewhere classified: Secondary | ICD-10-CM | POA: Diagnosis not present

## 2020-03-23 ENCOUNTER — Other Ambulatory Visit: Payer: Self-pay | Admitting: Internal Medicine

## 2020-03-24 DIAGNOSIS — M25631 Stiffness of right wrist, not elsewhere classified: Secondary | ICD-10-CM | POA: Diagnosis not present

## 2020-03-24 DIAGNOSIS — M25641 Stiffness of right hand, not elsewhere classified: Secondary | ICD-10-CM | POA: Diagnosis not present

## 2020-04-06 DIAGNOSIS — M25532 Pain in left wrist: Secondary | ICD-10-CM | POA: Diagnosis not present

## 2020-04-06 DIAGNOSIS — S52372D Galeazzi's fracture of left radius, subsequent encounter for closed fracture with routine healing: Secondary | ICD-10-CM | POA: Diagnosis not present

## 2020-04-06 DIAGNOSIS — Z4789 Encounter for other orthopedic aftercare: Secondary | ICD-10-CM | POA: Diagnosis not present

## 2020-04-06 DIAGNOSIS — M25632 Stiffness of left wrist, not elsewhere classified: Secondary | ICD-10-CM | POA: Diagnosis not present

## 2020-04-29 ENCOUNTER — Other Ambulatory Visit: Payer: Self-pay | Admitting: Internal Medicine

## 2020-04-29 MED ORDER — PROMETHAZINE-DM 6.25-15 MG/5ML PO SYRP: ORAL_SOLUTION | ORAL | 1 refills | Status: DC

## 2020-04-29 MED ORDER — DEXAMETHASONE 4 MG PO TABS: ORAL_TABLET | ORAL | 0 refills | Status: DC

## 2020-04-29 MED ORDER — AZITHROMYCIN 250 MG PO TABS: ORAL_TABLET | ORAL | 1 refills | Status: DC

## 2020-06-10 DIAGNOSIS — I700 Atherosclerosis of aorta: Secondary | ICD-10-CM | POA: Insufficient documentation
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
## 2020-06-11 ENCOUNTER — Ambulatory Visit (INDEPENDENT_AMBULATORY_CARE_PROVIDER_SITE_OTHER): Payer: BC Managed Care – PPO | Admitting: Adult Health

## 2020-06-11 ENCOUNTER — Encounter: Payer: Self-pay | Admitting: Adult Health

## 2020-06-11 ENCOUNTER — Other Ambulatory Visit: Payer: Self-pay

## 2020-06-11 VITALS — BP 122/82 | HR 88 | Temp 96.8°F | Wt 265.0 lb

## 2020-06-11 DIAGNOSIS — E559 Vitamin D deficiency, unspecified: Secondary | ICD-10-CM

## 2020-06-11 DIAGNOSIS — E785 Hyperlipidemia, unspecified: Secondary | ICD-10-CM | POA: Diagnosis not present

## 2020-06-11 DIAGNOSIS — Z79899 Other long term (current) drug therapy: Secondary | ICD-10-CM | POA: Diagnosis not present

## 2020-06-11 DIAGNOSIS — E1169 Type 2 diabetes mellitus with other specified complication: Secondary | ICD-10-CM

## 2020-06-11 DIAGNOSIS — N182 Chronic kidney disease, stage 2 (mild): Secondary | ICD-10-CM

## 2020-06-11 DIAGNOSIS — M3219 Other organ or system involvement in systemic lupus erythematosus: Secondary | ICD-10-CM

## 2020-06-11 DIAGNOSIS — I10 Essential (primary) hypertension: Secondary | ICD-10-CM | POA: Diagnosis not present

## 2020-06-11 DIAGNOSIS — E1122 Type 2 diabetes mellitus with diabetic chronic kidney disease: Secondary | ICD-10-CM

## 2020-06-11 DIAGNOSIS — M1000 Idiopathic gout, unspecified site: Secondary | ICD-10-CM

## 2020-06-11 DIAGNOSIS — E119 Type 2 diabetes mellitus without complications: Secondary | ICD-10-CM

## 2020-06-11 DIAGNOSIS — I700 Atherosclerosis of aorta: Secondary | ICD-10-CM | POA: Diagnosis not present

## 2020-06-11 DIAGNOSIS — E669 Obesity, unspecified: Secondary | ICD-10-CM

## 2020-06-11 DIAGNOSIS — I7789 Other specified disorders of arteries and arterioles: Secondary | ICD-10-CM

## 2020-06-11 MED ORDER — NEXLIZET 180-10 MG PO TABS: 1.0000 | ORAL_TABLET | Freq: Every day | ORAL | 1 refills | Status: DC

## 2020-06-11 NOTE — Patient Instructions (Addendum)
Goals    . Blood Pressure < 130/80    . HEMOGLOBIN A1C < 5.7    . LDL CALC < 70    . Weight (lb) < 250 lb (113.4 kg)        Take 1 tab nexlizet daily for cholesterol INSTEAD of zetia - this is combo pill  If tolerating samples well, try to pick up script from pharmacy    Bempedoic acid; Ezetimibe Tablets What is this medicine? BEMPEDOIC ACID; EZETIMIBE (BEM pe DOE ik AS id; ez ET i mibe) is used to lower the level of cholesterol in the blood. It is used with other cholesterol-lowering drugs. This medicine may be used for other purposes; ask your health care provider or pharmacist if you have questions. COMMON BRAND NAME(S): NEXLIZET What should I tell my health care provider before I take this medicine? They need to know if you have any of these conditions:  gout  kidney problems  liver problems  tendon problems  an unusual or allergic reaction to bempedoic acid, ezetimibe, other medicines, foods, dyes, or preservatives  pregnant or trying to become pregnant  breast-feeding How should I use this medicine? Take this medicine by mouth with a glass of water. Follow the directions on the prescription label. Do not cut, crush, or chew this medicine. Swallow the tablets whole. You can take it with or without food. If it upsets your stomach, take it with food. Take your doses at regular intervals. Do not take your medicine more often than directed. Talk to your pediatrician about the use of this medicine in children. Special care may be needed. Overdosage: If you think you have taken too much of this medicine contact a poison control center or emergency room at once. NOTE: This medicine is only for you. Do not share this medicine with others. What if I miss a dose? If you miss a dose, take it as soon as you can. If it is almost time for your next dose, take only that dose. Do not take double or extra doses. What may interact with this medicine? Do not take this medicine with any  of the following medications:  fenofibrate  gemfibrozil This medicine may also interact with the following medications:  antacids  cyclosporine  pravastatin  simvastatin  other medicines to lower cholesterol or triglycerides This list may not describe all possible interactions. Give your health care provider a list of all the medicines, herbs, non-prescription drugs, or dietary supplements you use. Also tell them if you smoke, drink alcohol, or use illegal drugs. Some items may interact with your medicine. What should I watch for while using this medicine? Visit your health care professional for regular checks on your progress. Tell your health care professional if your symptoms do not start to get better or if they get worse. You may need blood work done while you are taking this medicine. This drug is only part of a total heart-health program. Your doctor or a dietician can suggest a low-cholesterol and low-fat diet to help. Avoid alcohol and smoking, and keep a proper exercise schedule. What side effects may I notice from receiving this medicine? Side effects that you should report to your doctor or health care professional as soon as possible:  allergic reactions like skin rash, itching or hives, swelling of the face, lips, or tongue  signs of gout such as swollen, red, warm, or tender joints, especially in the toes  signs of tendon problems such as tendon pain or  swelling or if you are unable to move a joint Side effects that usually do not require medical attention (report these to your doctor or health care professional if they continue or are bothersome):  back pain  cold or flu-like symptoms  headache  muscle spasms  stomach upset or pain This list may not describe all possible side effects. Call your doctor for medical advice about side effects. You may report side effects to FDA at 1-800-FDA-1088. Where should I keep my medicine? Keep out of the reach of  children. Store at room temperature between 15 and 30 degrees C (59 and 86 degrees F). Keep this medicine in the original container. Do not throw out the packet in the container. It keeps the medicine dry. Throw away any unused medication after the expiration date. NOTE: This sheet is a summary. It may not cover all possible information. If you have questions about this medicine, talk to your doctor, pharmacist, or health care provider.  2021 Elsevier/Gold Standard (2018-07-11 13:09:47)

## 2020-06-12 ENCOUNTER — Other Ambulatory Visit: Payer: Self-pay | Admitting: Adult Health

## 2020-06-12 LAB — COMPLETE METABOLIC PANEL WITHOUT GFR
AG Ratio: 1.5 (calc) (ref 1.0–2.5)
ALT: 109 U/L — ABNORMAL HIGH (ref 9–46)
AST: 85 U/L — ABNORMAL HIGH (ref 10–35)
Albumin: 4 g/dL (ref 3.6–5.1)
Alkaline phosphatase (APISO): 57 U/L (ref 35–144)
BUN: 22 mg/dL (ref 7–25)
CO2: 27 mmol/L (ref 20–32)
Calcium: 9.6 mg/dL (ref 8.6–10.3)
Chloride: 98 mmol/L (ref 98–110)
Creat: 1.03 mg/dL (ref 0.70–1.25)
GFR, Est African American: 89 mL/min/1.73m2
GFR, Est Non African American: 77 mL/min/1.73m2
Globulin: 2.7 g/dL (ref 1.9–3.7)
Glucose, Bld: 144 mg/dL — ABNORMAL HIGH (ref 65–99)
Potassium: 4.2 mmol/L (ref 3.5–5.3)
Sodium: 137 mmol/L (ref 135–146)
Total Bilirubin: 0.8 mg/dL (ref 0.2–1.2)
Total Protein: 6.7 g/dL (ref 6.1–8.1)

## 2020-06-12 LAB — CBC WITH DIFFERENTIAL/PLATELET
Absolute Monocytes: 621 cells/uL (ref 200–950)
Basophils Absolute: 49 cells/uL (ref 0–200)
Basophils Relative: 0.5 %
Eosinophils Absolute: 213 cells/uL (ref 15–500)
Eosinophils Relative: 2.2 %
HCT: 43.8 % (ref 38.5–50.0)
Hemoglobin: 15 g/dL (ref 13.2–17.1)
Lymphs Abs: 2134 cells/uL (ref 850–3900)
MCH: 32.3 pg (ref 27.0–33.0)
MCHC: 34.2 g/dL (ref 32.0–36.0)
MCV: 94.4 fL (ref 80.0–100.0)
MPV: 10.7 fL (ref 7.5–12.5)
Monocytes Relative: 6.4 %
Neutro Abs: 6683 cells/uL (ref 1500–7800)
Neutrophils Relative %: 68.9 %
Platelets: 256 10*3/uL (ref 140–400)
RBC: 4.64 10*6/uL (ref 4.20–5.80)
RDW: 12.6 % (ref 11.0–15.0)
Total Lymphocyte: 22 %
WBC: 9.7 10*3/uL (ref 3.8–10.8)

## 2020-06-12 LAB — HEMOGLOBIN A1C
Hgb A1c MFr Bld: 8 %{Hb} — ABNORMAL HIGH
Mean Plasma Glucose: 183 mg/dL
eAG (mmol/L): 10.1 mmol/L

## 2020-06-12 LAB — LIPID PANEL
Cholesterol: 219 mg/dL — ABNORMAL HIGH
HDL: 36 mg/dL — ABNORMAL LOW
LDL Cholesterol (Calc): 149 mg/dL — ABNORMAL HIGH
Non-HDL Cholesterol (Calc): 183 mg/dL — ABNORMAL HIGH
Total CHOL/HDL Ratio: 6.1 (calc) — ABNORMAL HIGH
Triglycerides: 207 mg/dL — ABNORMAL HIGH

## 2020-06-12 LAB — MAGNESIUM: Magnesium: 1.8 mg/dL (ref 1.5–2.5)

## 2020-06-12 LAB — TSH: TSH: 4.77 mIU/L — ABNORMAL HIGH (ref 0.40–4.50)

## 2020-06-12 MED ORDER — METFORMIN HCL ER 500 MG PO TB24: ORAL_TABLET | ORAL | 0 refills | Status: DC

## 2020-06-30 ENCOUNTER — Other Ambulatory Visit: Payer: Self-pay | Admitting: Internal Medicine

## 2020-06-30 DIAGNOSIS — G63 Polyneuropathy in diseases classified elsewhere: Secondary | ICD-10-CM

## 2020-07-02 DIAGNOSIS — H5213 Myopia, bilateral: Secondary | ICD-10-CM | POA: Diagnosis not present

## 2020-07-02 DIAGNOSIS — H35372 Puckering of macula, left eye: Secondary | ICD-10-CM | POA: Diagnosis not present

## 2020-07-14 NOTE — Progress Notes (Unsigned)
FOLLOW UP  Assessment and Plan:   Atherosclerosis of aorta Control blood pressure, cholesterol, glucose, increase exercise.   Hypertension Well controlled with current medications  Monitor blood pressure at home; patient to call if consistently greater than 130/80 Continue DASH diet.   Reminder to go to the ER if any CP, SOB, nausea, dizziness, severe HA, changes vision/speech, left arm numbness and tingling and jaw pain.  NASH Weight loss and low GI diet advised, will monitor LFTs  T2DM with CKD II ([name]) Continue diet and exercise.  Perform daily foot/skin check, notify office of any concerning changes.  Check fructosamine  Hyperlipidemia associated with T2DM ([name]) Currently above goal; intolerant of atorvastatin, zocor; rosuvastatin Has been on zetia; nexlizet was discussed but insurance wouldn't cover; re appealing today; ? Per prior auth may be covered if prescribed separately LDL goal <70 Continue low cholesterol diet and exercise.  Check lipid panel.   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 Further disposition pending results of labs. Discussed med's effects and SE's.   Over 30 minutes of exam, counseling, chart review, and critical decision making was performed.   Future Appointments  Date Time Provider [place]  09/08/2020  2:00  PM [name], MD [facility] None    ----------------------------------------------------------------------------------------------------------------------  HPI 65 y.o. male  presents for 6 week follow up on hypertension, cholesterol, diabetes, obesity, gout and vitamin D deficiency. Patient was diagnosed with cutaneous Lupus in 2017 and is followed at [facility]  Aug 2018 patient had a severe intolerant or allergic reaction to Imuran. Since then his cutaneous Lupus symptoms seem reasonably controlled on Prednisone 5 mg daily. Reports hasn't had a flare in 4 years. He has some related peripheral neuropathic pain, reports well managed with gabapentin 100 mg PRN. He is on rare PRN xanax for anxiety exacerbated by prednisone, reports hasn't needed in several weeks.   BMI is Body mass index is 36.78 kg/m., he has been working on exercise, admits to poor diet, fairly poorly motivated to work on this.  He reports has been cutting out soft drinks, sweet tea, potatoes, fried foods cut out as much as possible. He is walking more and golfing. STRONG prefer to avoid all medications.  Wt Readings from Last 3 Encounters:  07/15/20 260 lb (117.9 kg)  06/11/20 265 lb (120.2 kg)  03/11/20 258 lb 3.2 oz (117.1 kg)   His blood pressure has been controlled at home (120s/80s), today their BP is BP: 122/78   He does not workout. He denies chest pain, shortness of breath, dizziness.   He has aortic atherosclerosis per Ct 01/2020   He is on cholesterol medication (cannot tolerate lipitor, zocor, rosuvastatin on zetia 10 mg daily ,sent in nexlizet last visit but insurance declined -? Per prior auth would cover separately- will try to reappeal ) and denies myalgias. His cholesterol is not at goal. The cholesterol  last visit was:    Lab Results  Component Value Date   CHOL 219 (H) 06/11/2020   HDL 36 (L) 06/11/2020   LDLCALC 149 (H) 06/11/2020   TRIG 207 (H) 06/11/2020   CHOLHDL 6.1 (H) 06/11/2020      He has been working on diet and exercise for T2DM (A1C 6.7% in 08/2018), was up last visit and recommended metformin but declined, strong preference to avoid meds, and denies foot ulcerations, increased appetite, nausea, polydipsia, polyuria, visual disturbances, vomiting and weight loss. Last A1C in the office was:  Lab Results  Component Value Date   HGBA1C 8.0 (H) 06/11/2020   He has CKD II associated with T2DM monitored at this office:  Lab Results  Component Value Date   [facility] 77 06/11/2020   Patient is on Vitamin D supplement.   Lab Results  Component Value Date   VD25OH 84 03/11/2020     Patient has hx of gout though has stopped taking allopurinol, controlled on low dose prednisone, and does not report a recent flare.  Lab Results  Component Value Date   LABURIC 4.9 03/11/2020    No hx of thyroid, last was mildly hypothyroid, strongly prefers to avoid medication.  Lab Results  Component Value Date   TSH 4.77 (H) 06/11/2020     Current Medications:  Current Outpatient Medications on File Prior to Visit  Medication Sig  . allopurinol (ZYLOPRIM) 300 MG tablet Take 1 tablet Daily to Prevent Gout  . ALPRAZolam (XANAX) 1 MG tablet Take      1/2 - 1 tablet      2 - 3 x /day      ONLY if needed for Anxiety Attack &  limit to 5 days /week to avoid Addiction & Dementia  . aspirin EC 81 MG tablet Take 81 mg by mouth daily.   Marland Kitchen atenolol (TENORMIN) 100 MG tablet Take  1 tablet  Daily  for BP  . Bempedoic Acid-Ezetimibe (NEXLIZET) 180-10 MG TABS Take 1 tablet by mouth daily.  . Cholecalciferol (VITAMIN D3) 5000 UNITS CAPS Take 10,000 Units by mouth daily.   Marland Kitchen ezetimibe (ZETIA) 10 MG tablet Take  1 tablet  Daily  for Cholesterol  . gabapentin (NEURONTIN) 100 MG capsule Take  1 capsule  3 x /day  as needed for  Neuropathy Pain  . hydrOXYzine (ATARAX/VISTARIL) 25 MG tablet TAKE 2 TABLETS THREE TIMES DAILY AS NEEDED FOR ITCHING.  Marland Kitchen lisinopril-hydrochlorothiazide (ZESTORETIC) 20-25 MG  tablet Take  1 tablet  Daily  for BP & Fluid Retention /Ankle Swelling  . Magnesium 400 MG TABS Take 1 tablet by mouth daily.  . metFORMIN (GLUCOPHAGE XR) 500 MG 24 hr tablet Start taking 1 tab daily with largest meal of the day for sugars/diabetes; if tolerating well in 2 weeks add a second dose to your next largest meal.  . MILK THISTLE PO Take 1 tablet by mouth daily.  . Multiple Vitamin (MULTIVITAMIN) capsule Take 1 capsule by mouth daily.  . Omega-3 Fatty Acids (FISH OIL PO) Take 1 capsule by mouth daily.  . ondansetron (ZOFRAN) 4 MG tablet Take 1 tablet (4 mg total) by mouth daily as needed for nausea or vomiting.  Marland Kitchen OVER THE COUNTER MEDICATION Takes Alphalophoric Acid 1 capsule daily  . predniSONE (DELTASONE) 10 MG tablet TAKE 1 OR 2 TABLETS DAILY AS DIRECTED BY DR. (Patient taking differently: Take 5 mg by mouth daily.)  . Probiotic Product (PROBIOTIC DAILY) CAPS Take 1 capsule by mouth daily.  Marland Kitchen  triamcinolone cream (KENALOG) 0.1 % Apply 1 application topically 3 (three) times daily. (Patient taking differently: Apply 1 application topically as needed.)  . TURMERIC PO Take 2,000 mg by mouth daily.   Marland Kitchen zinc gluconate 50 MG tablet Take 50 mg by mouth daily.   No current facility-administered medications on file prior to visit.     Allergies:  Allergies  Allergen Reactions  . Doxycycline     Lupus flair  . Imuran [Azathioprine] Rash  . Lipitor [Atorvastatin]     Myalgias   . Penicillins     Causes lupus flare ups  . Rosuvastatin Diarrhea  . Zocor [Simvastatin]     Myalgias     Medical History:  Past Medical History:  Diagnosis Date  . Asthma   . GERD (gastroesophageal reflux disease)   . Hypertension   . Lupus ([place])   . Prediabetes   . Seasonal allergies   . Vitamin D deficiency    Family history- Reviewed and unchanged Social history- Reviewed and unchanged   Review of Systems:  Review of Systems  Constitutional: Negative for malaise/fatigue and weight loss.   HENT: Negative for hearing loss and tinnitus.   Eyes: Negative for blurred vision and double vision.  Respiratory: Negative for cough, shortness of breath and wheezing.   Cardiovascular: Negative for chest pain, palpitations, orthopnea, claudication and leg swelling.  Gastrointestinal: Negative for abdominal pain, blood in stool, constipation, diarrhea, heartburn, melena, nausea and vomiting.  Genitourinary: Negative.   Musculoskeletal: Negative for joint pain and myalgias.  Skin: Negative for rash.  Neurological: Positive for tingling (bilateral feet). Negative for dizziness, sensory change, weakness and headaches.  Endo/Heme/Allergies: Negative for polydipsia.  Psychiatric/Behavioral: Negative.   All other systems reviewed and are negative.   Physical Exam: BP 122/78   Pulse 69   Temp (!) 96.1 F (35.6 C)   Wt 260 lb (117.9 kg)   SpO2 99%   BMI 36.78 kg/m  Wt Readings from Last 3 Encounters:  07/15/20 260 lb (117.9 kg)  06/11/20 265 lb (120.2 kg)  03/11/20 258 lb 3.2 oz (117.1 kg)     General Appearance: Well nourished, in no apparent distress. Eyes: PERRLA, EOMs, conjunctiva no swelling or erythema Sinuses: No Frontal/maxillary tenderness ENT/Mouth: Ext aud canals clear, TMs without erythema, bulging. No erythema, swelling, or exudate on post pharynx.  Tonsils not swollen or erythematous. Hearing normal.  Neck: Supple, thyroid normal.  Respiratory: Respiratory effort normal, BS equal bilaterally without rales, rhonchi, wheezing or stridor.  Cardio: RRR with no MRGs. Brisk peripheral pulses without edema.  Abdomen: Soft, + BS.  Non tender, no guarding, rebound, hernias, masses. Lymphatics: Non tender without lymphadenopathy.  Musculoskeletal: Full ROM, 5/5 strength, Normal gait Skin: Warm, dry; Scattered erythematous to velvety pink to violaceous circumscribed lesions occasionally excoriated and some mild plaque-like over the scalp, trunk & extremities.  Neuro: Cranial  nerves intact. No cerebellar symptoms.  Psych: Awake and oriented X 3, normal affect, Insight and Judgment appropriate.    Izora Ribas, NP 11:48 AM [facility]

## 2020-07-15 ENCOUNTER — Encounter: Payer: Self-pay | Admitting: Adult Health

## 2020-07-15 ENCOUNTER — Other Ambulatory Visit: Payer: Self-pay

## 2020-07-15 ENCOUNTER — Ambulatory Visit (INDEPENDENT_AMBULATORY_CARE_PROVIDER_SITE_OTHER): Payer: BC Managed Care – PPO | Admitting: Adult Health

## 2020-07-15 VITALS — BP 122/78 | HR 69 | Temp 96.1°F | Wt 260.0 lb

## 2020-07-15 DIAGNOSIS — E1169 Type 2 diabetes mellitus with other specified complication: Secondary | ICD-10-CM | POA: Diagnosis not present

## 2020-07-15 DIAGNOSIS — I700 Atherosclerosis of aorta: Secondary | ICD-10-CM | POA: Diagnosis not present

## 2020-07-15 DIAGNOSIS — I10 Essential (primary) hypertension: Secondary | ICD-10-CM

## 2020-07-15 DIAGNOSIS — E669 Obesity, unspecified: Secondary | ICD-10-CM

## 2020-07-15 DIAGNOSIS — E1122 Type 2 diabetes mellitus with diabetic chronic kidney disease: Secondary | ICD-10-CM | POA: Diagnosis not present

## 2020-07-15 DIAGNOSIS — E785 Hyperlipidemia, unspecified: Secondary | ICD-10-CM

## 2020-07-15 DIAGNOSIS — M3219 Other organ or system involvement in systemic lupus erythematosus: Secondary | ICD-10-CM | POA: Diagnosis not present

## 2020-07-15 DIAGNOSIS — Z79899 Other long term (current) drug therapy: Secondary | ICD-10-CM

## 2020-07-15 DIAGNOSIS — E559 Vitamin D deficiency, unspecified: Secondary | ICD-10-CM

## 2020-07-15 DIAGNOSIS — M1000 Idiopathic gout, unspecified site: Secondary | ICD-10-CM

## 2020-07-15 DIAGNOSIS — I7789 Other specified disorders of arteries and arterioles: Secondary | ICD-10-CM

## 2020-07-15 DIAGNOSIS — N182 Chronic kidney disease, stage 2 (mild): Secondary | ICD-10-CM

## 2020-07-17 LAB — COMPLETE METABOLIC PANEL WITH GFR
AG Ratio: 1.6 (calc) (ref 1.0–2.5)
ALT: 77 U/L — ABNORMAL HIGH (ref 9–46)
AST: 78 U/L — ABNORMAL HIGH (ref 10–35)
Albumin: 4.2 g/dL (ref 3.6–5.1)
Alkaline phosphatase (APISO): 50 U/L (ref 35–144)
BUN: 14 mg/dL (ref 7–25)
CO2: 26 mmol/L (ref 20–32)
Calcium: 9.4 mg/dL (ref 8.6–10.3)
Chloride: 99 mmol/L (ref 98–110)
Creat: 0.91 mg/dL (ref 0.70–1.25)
GFR, Est African American: 104 mL/min/{1.73_m2} (ref >=60)
GFR, Est Non African American: 89 mL/min/{1.73_m2} (ref >=60)
Globulin: 2.7 g/dL (calc) (ref 1.9–3.7)
Glucose, Bld: 129 mg/dL — ABNORMAL HIGH (ref 65–99)
Potassium: 4.3 mmol/L (ref 3.5–5.3)
Sodium: 139 mmol/L (ref 135–146)
Total Bilirubin: 0.8 mg/dL (ref 0.2–1.2)
Total Protein: 6.9 g/dL (ref 6.1–8.1)

## 2020-07-17 LAB — FRUCTOSAMINE: Fructosamine: 256 umol/L (ref 205–285)

## 2020-07-17 LAB — TSH: TSH: 1.17 mIU/L (ref 0.40–4.50)

## 2020-09-03 ENCOUNTER — Ambulatory Visit (INDEPENDENT_AMBULATORY_CARE_PROVIDER_SITE_OTHER): Payer: BC Managed Care – PPO | Admitting: Adult Health

## 2020-09-03 ENCOUNTER — Other Ambulatory Visit: Payer: Self-pay

## 2020-09-03 ENCOUNTER — Encounter: Payer: Self-pay | Admitting: Adult Health

## 2020-09-03 VITALS — BP 106/64 | HR 65 | Temp 97.3°F | Ht 70.5 in | Wt 254.0 lb

## 2020-09-03 DIAGNOSIS — N401 Enlarged prostate with lower urinary tract symptoms: Secondary | ICD-10-CM | POA: Diagnosis not present

## 2020-09-03 DIAGNOSIS — J4520 Mild intermittent asthma, uncomplicated: Secondary | ICD-10-CM

## 2020-09-03 DIAGNOSIS — Z136 Encounter for screening for cardiovascular disorders: Secondary | ICD-10-CM | POA: Diagnosis not present

## 2020-09-03 DIAGNOSIS — I10 Essential (primary) hypertension: Secondary | ICD-10-CM

## 2020-09-03 DIAGNOSIS — Z13220 Encounter for screening for lipoid disorders: Secondary | ICD-10-CM | POA: Diagnosis not present

## 2020-09-03 DIAGNOSIS — I7789 Other specified disorders of arteries and arterioles: Secondary | ICD-10-CM

## 2020-09-03 DIAGNOSIS — Z131 Encounter for screening for diabetes mellitus: Secondary | ICD-10-CM | POA: Diagnosis not present

## 2020-09-03 DIAGNOSIS — Z125 Encounter for screening for malignant neoplasm of prostate: Secondary | ICD-10-CM | POA: Diagnosis not present

## 2020-09-03 DIAGNOSIS — E559 Vitamin D deficiency, unspecified: Secondary | ICD-10-CM

## 2020-09-03 DIAGNOSIS — M1000 Idiopathic gout, unspecified site: Secondary | ICD-10-CM

## 2020-09-03 DIAGNOSIS — M1A9XX0 Chronic gout, unspecified, without tophus (tophi): Secondary | ICD-10-CM

## 2020-09-03 DIAGNOSIS — L089 Local infection of the skin and subcutaneous tissue, unspecified: Secondary | ICD-10-CM | POA: Insufficient documentation

## 2020-09-03 DIAGNOSIS — E1122 Type 2 diabetes mellitus with diabetic chronic kidney disease: Secondary | ICD-10-CM

## 2020-09-03 DIAGNOSIS — Z0001 Encounter for general adult medical examination with abnormal findings: Secondary | ICD-10-CM

## 2020-09-03 DIAGNOSIS — R350 Frequency of micturition: Secondary | ICD-10-CM | POA: Diagnosis not present

## 2020-09-03 DIAGNOSIS — Z0000 Encounter for general adult medical examination without abnormal findings: Secondary | ICD-10-CM | POA: Diagnosis not present

## 2020-09-03 DIAGNOSIS — M3219 Other organ or system involvement in systemic lupus erythematosus: Secondary | ICD-10-CM

## 2020-09-03 DIAGNOSIS — F419 Anxiety disorder, unspecified: Secondary | ICD-10-CM

## 2020-09-03 DIAGNOSIS — K7581 Nonalcoholic steatohepatitis (NASH): Secondary | ICD-10-CM

## 2020-09-03 DIAGNOSIS — Z1329 Encounter for screening for other suspected endocrine disorder: Secondary | ICD-10-CM | POA: Diagnosis not present

## 2020-09-03 DIAGNOSIS — Z79899 Other long term (current) drug therapy: Secondary | ICD-10-CM | POA: Diagnosis not present

## 2020-09-03 DIAGNOSIS — K219 Gastro-esophageal reflux disease without esophagitis: Secondary | ICD-10-CM

## 2020-09-03 DIAGNOSIS — I700 Atherosclerosis of aorta: Secondary | ICD-10-CM

## 2020-09-03 DIAGNOSIS — E11628 Type 2 diabetes mellitus with other skin complications: Secondary | ICD-10-CM | POA: Insufficient documentation

## 2020-09-03 DIAGNOSIS — J302 Other seasonal allergic rhinitis: Secondary | ICD-10-CM

## 2020-09-03 DIAGNOSIS — Z1389 Encounter for screening for other disorder: Secondary | ICD-10-CM

## 2020-09-03 DIAGNOSIS — E1169 Type 2 diabetes mellitus with other specified complication: Secondary | ICD-10-CM

## 2020-09-03 DIAGNOSIS — N182 Chronic kidney disease, stage 2 (mild): Secondary | ICD-10-CM

## 2020-09-03 DIAGNOSIS — I452 Bifascicular block: Secondary | ICD-10-CM | POA: Diagnosis not present

## 2020-09-03 DIAGNOSIS — G63 Polyneuropathy in diseases classified elsewhere: Secondary | ICD-10-CM

## 2020-09-03 HISTORY — DX: Morbid (severe) obesity due to excess calories: E66.01

## 2020-09-03 MED ORDER — CELECOXIB 200 MG PO CAPS: ORAL_CAPSULE | ORAL | 1 refills | Status: DC

## 2020-09-03 MED ORDER — MUPIROCIN CALCIUM 2 % EX CREA: 1.0000 "application " | TOPICAL_CREAM | Freq: Three times a day (TID) | CUTANEOUS | 2 refills | Status: DC

## 2020-09-03 MED ORDER — AZITHROMYCIN 250 MG PO TABS: ORAL_TABLET | ORAL | 1 refills | Status: AC

## 2020-09-03 NOTE — Progress Notes (Signed)
Complete Physical  Assessment and Plan:  [place] was seen today for annual exam.  Diagnoses and all orders for this visit:  Encounter for general adult medical examination with abnormal findings Due annually   Aortic atherosclerosis ([place]) Control blood pressure, cholesterol, glucose, increase exercise.  -     Lipid panel  Systemic lupus erythematosus with other organ involvement, unspecified SLE type ([place]) Follows with [facility] rheum Doesn't tolerate most abx - trigger flares; zpak is ok per patient -     CBC with Differential/Platelet  Polyneuropathy associated with underlying disease ([place]) Check feet daily, secondary to lupus, declines podiatry referral   Morbid obesity, unspecified obesity type ([place]) Long discussion about weight loss, diet, and exercise Recommended diet heavy in fruits and veggies and low in animal meats, cheeses, and dairy products, appropriate calorie intake Discussed appropriate weight for height  Follow up at next visit  CKD stage 2 due to type 2 diabetes mellitus ([place]) Increase fluids, avoid NSAIDS, monitor sugars, will monitor -     COMPLETE METABOLIC PANEL WITH GFR -     Microalbumin / creatinine urine ratio -     Urinalysis, Routine w reflex microscopic  Hyperlipidemia associated with type 2 diabetes mellitus ([place]) Continue medications: zetia Intolerance of several meds, nexletol tolerated but insurance wouldn't cover Declined cardiology/lipid referral  Discussed low dose CT coronary calcium and agreeable for self pay option,  Continue low cholesterol diet and exercise.  Check lipid panel.  -     Lipid panel -     TSH  Type 2 diabetes mellitus with stage 2 chronic kidney disease, without long-term current use of insulin ([name]) Declined metformin, working on lifestyle to try to control without meds per strong patient preference Education: Reviewed 'ABCs' of diabetes management (respective goals in parentheses):  A1C (<7), blood pressure (<130/80), and  cholesterol (LDL <70) Eye Exam yearly and Dental Exam every 6 months. Dietary recommendations Physical Activity recommendations - given foot care handout and explained the principles  -     COMPLETE METABOLIC PANEL WITH GFR -     Hemoglobin A1c  Diabetic infection of left foot ([place]) Cannot take most abx due to trigger lupus flares He can tolerate zpak, will add mupirocin topial to cover MRSA Also given betadine sugar slurry dressing instructions Declined referral to podiatry Monitor closely and advised present to UC/ED if any worse while on vacation -     azithromycin (ZITHROMAX) 250 MG tablet; Take 2 tablets (500 mg) on  Day 1,  followed by 1 tablet (250 mg) once daily on Days 2 through 5. -     mupirocin cream (BACTROBAN) 2 %; Apply 1 application topically 3 (three) times daily. For 7-14 days  NASH (nonalcoholic steatohepatitis) Weight loss advised, avoid alcohol/tylenol, will monitor LFTs -     COMPLETE METABOLIC PANEL WITH GFR  Primary hypertension Continue medication Monitor blood pressure at home; call if consistently over 130/80 Continue DASH diet.   Reminder to go to the ER if any CP, SOB, nausea, dizziness, severe HA, changes vision/speech, left arm numbness and tingling and jaw pain. -     CBC with Differential/Platelet -     COMPLETE METABOLIC PANEL WITH GFR -     Magnesium -     TSH -     Microalbumin / creatinine urine ratio -     Urinalysis, Routine w reflex microscopic -     EKG 12-Lead  Mild intermittent asthma without complication Monitor, avoid triggers, albuterol PRN -  CBC with Differential/Platelet  Gastroesophageal reflux disease, unspecified whether esophagitis present Well managed on current medications Discussed diet, avoiding triggers and other lifestyle changes -     COMPLETE METABOLIC PANEL WITH GFR -     Magnesium  Anxiety Well managed by current regimen; benzo PRN rare use continue medications Stress management techniques discussed,  increase water, good sleep hygiene discussed, increase exercise, and increase veggies.   Idiopathic gout, unspecified chronicity, unspecified site Continue allopurinol -     Uric acid  Vitamin D deficiency -     VITAMIN D 25 Hydroxy (Vit-D Deficiency, Fractures)  Seasonal allergies Continue OTC antihistamine PRN  Screening for prostate cancer -     PSA  Bifascicular block Monitor; verified not changed from last year; declines cardiology referral today   Discussed med's effects and SE's. Screening labs and tests as requested with regular follow-up as recommended. Over 40 minutes of exam, counseling, chart review and critical decision making was performed  Future Appointments  Date Time Provider [place]  03/09/2021 11:00 AM [name], NP [facility] None  09/03/2021  9:00 AM [name], NP [facility] None      [name] presents for a complete physical. 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   Patient was diagnosed with cutaneous Lupus in 2017 and is followed at [facility]  Aug 2018 patient had a severe intolerant or allergic reaction to Imuran. Since then his cutaneous Lupus symptoms seem reasonably controlled on Prednisone 5 mg daily. Reports hasn't had a flare in 4 years. He has some related peripheral neuropathic pain,  reports well managed with gabapentin 100 mg PRN. He is on rare PRN xanax for anxiety exacerbated by prednisone, reports hasn't needed in several weeks.    He reports tender wound to left sole, can't see, has been avoiding bearing weight and keeping clean. Concerned due to leaving for beach vacation tomorrow.   BMI is Body mass index is 35.93 kg/m., he has been working on diet and exercise. Wt Readings from Last 3 Encounters:  09/03/20 254 lb (115.2 kg)  07/15/20 260 lb (117.9 kg)  06/11/20 265 lb (120.2 kg)   His blood pressure has been controlled at home, today their BP is BP: 106/64 He does workout. He denies chest pain, shortness of breath, dizziness.   He has aortic atherosclerosis per Ct 01/2020  He is on cholesterol medication (cannot tolerate lipitor, zocor, rosuvastatin on zetia 10 mg daily, sent in nexlizet last visit but insurance declined) and denies myalgias. Declines lipid clinic referral, receptive to CT coronary calcium. His cholesterol is not at goal. The cholesterol last visit was:   Lab Results  Component Value Date   CHOL 219 (H) 06/11/2020   HDL 36 (L) 06/11/2020   LDLCALC 149 (H) 06/11/2020   TRIG 207 (H) 06/11/2020   CHOLHDL 6.1 (H) 06/11/2020   He has been working on diet and exercise for T2 diabetes newly up 7+ after last visit, he is on bASA, he is on ACE/ARB and denies increased  appetite, nausea, polydipsia, polyuria and visual disturbances. He has neuropathy attributed to lupus, predates T2DM diagnosis. Last A1C in the office was:  Lab Results  Component Value Date   HGBA1C 8.0 (H) 06/11/2020   Last GFR: Lab Results  Component Value Date   GFRNONAA 89 07/15/2020    Patient is on Vitamin D supplement, taking 10000 IU daily.    Lab Results  Component Value Date   VD25OH 84 03/11/2020     Patient is on allopurinol for gout and does not report a recent flare.  Lab Results  Component Value Date   LABURIC 4.9 03/11/2020   Last PSA was: Lab Results   Component Value Date   PSA 0.3 09/04/2019    Current Medications:  Current Outpatient Medications on File Prior to Visit  Medication Sig Dispense Refill  . allopurinol (ZYLOPRIM) 300 MG tablet Take 1 tablet Daily to Prevent Gout 90 tablet 3  . ALPRAZolam (XANAX) 1 MG tablet Take      1/2 - 1 tablet      2 - 3 x /day      ONLY if needed for Anxiety Attack &  limit to 5 days /week to avoid Addiction & Dementia 90 tablet 0  . aspirin EC 81 MG tablet Take 81 mg by mouth daily.     Marland Kitchen atenolol (TENORMIN) 100 MG tablet Take  1 tablet  Daily  for BP 90 tablet 0  . Bempedoic Acid-Ezetimibe (NEXLIZET) 180-10 MG TABS Take 1 tablet by mouth daily. 90 tablet 1  . Cholecalciferol (VITAMIN D3) 5000 UNITS CAPS Take 10,000 Units by mouth daily.     Marland Kitchen ezetimibe (ZETIA) 10 MG tablet Take  1 tablet  Daily  for Cholesterol 90 tablet 0  . gabapentin (NEURONTIN) 100 MG capsule Take  1 capsule  3 x /day  as needed for  Neuropathy Pain 270 capsule 0  . hydrOXYzine (ATARAX/VISTARIL) 25 MG tablet TAKE 2 TABLETS THREE TIMES DAILY AS NEEDED FOR ITCHING. 60 tablet 0  . lisinopril-hydrochlorothiazide (ZESTORETIC) 20-25 MG tablet Take  1 tablet  Daily  for BP & Fluid Retention /Ankle Swelling 90 tablet 0  . Magnesium 400 MG TABS Take 1 tablet by mouth daily.    . metFORMIN (GLUCOPHAGE XR) 500 MG 24 hr tablet Start taking 1 tab daily with largest meal of the day for sugars/diabetes; if tolerating well in 2 weeks add a second dose to your next largest meal. 180 tablet 0  . MILK THISTLE PO Take 1 tablet by mouth daily.    . Multiple Vitamin (MULTIVITAMIN) capsule Take 1 capsule by mouth daily.    . Omega-3 Fatty Acids (FISH OIL PO) Take 1 capsule by mouth daily.    . ondansetron (ZOFRAN) 4 MG tablet Take 1 tablet (4 mg total) by mouth daily as needed for nausea or vomiting. 30 tablet 1  . OVER THE COUNTER MEDICATION Takes Alphalophoric Acid 1 capsule daily    . predniSONE (DELTASONE) 10 MG tablet TAKE 1 OR 2 TABLETS DAILY AS  DIRECTED BY DR. (Patient taking differently: Take 5 mg by mouth daily.) 90 tablet 0  . Probiotic Product (PROBIOTIC DAILY) CAPS Take 1 capsule by mouth daily.    Marland Kitchen triamcinolone cream (KENALOG) 0.1 % Apply 1 application topically 3 (three) times daily. (Patient taking differently: Apply 1 application topically as needed.) 30 g 0  . TURMERIC PO Take 2,000 mg by mouth daily.     Marland Kitchen zinc gluconate 50 MG tablet Take 50  mg by mouth daily.     No current facility-administered medications on file prior to visit.   Allergies:  Allergies  Allergen Reactions  . Doxycycline     Lupus flair  . Imuran [Azathioprine] Rash  . Lipitor [Atorvastatin]     Myalgias   . Penicillins     Causes lupus flare ups  . Rosuvastatin Diarrhea  . Zocor [Simvastatin]     Myalgias   Health Maintenance:  Immunization History  Administered Date(s) Administered  . Influenza Inj Mdck Quad With Preservative 03/04/2019, 03/11/2020  . Influenza Split 02/26/2013, 02/09/2015  . Influenza, Quadrivalent, Recombinant, Inj, Pf 03/14/2016, 02/08/2017, 01/31/2018  . PPD Test 07/22/2013, 07/23/2014, 07/24/2015, 08/14/2017, 08/27/2018, 09/04/2019  . Pneumococcal Conjugate-13 03/14/2016  . Pneumococcal Polysaccharide-23 05/22/2008  . Tdap 07/18/2012    Tetanus: 2014 Pneumovax: 2010 Prevnar 13: 2017 Flu vaccine: 03/2020 Shingrix: declines Covid 19: declines  Colonoscopy: 07/2011 - normal, Dr. Sharlett Iles EGD: 07/2011  Eye Exam: last 06/2020, monitoring cataracts, Dr. 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 Surgical History:  He  has a past surgical history that includes Tonsillectomy; Wisdom tooth extraction; Axillary lymph node biopsy (Right, 08/27/2012); Cholecystectomy (N/A, 12/28/2012); and ORIF wrist fracture (Left, 01/17/2020). Family History:  His family history includes Diabetes in his mother; Heart disease in his mother. Social History:   reports that he has never smoked. He has never used smokeless tobacco. He reports current alcohol use. He reports that he does not use drugs.  Review of Systems:  Review of Systems  Constitutional: Negative for malaise/fatigue and weight loss.  HENT: Negative for hearing loss and tinnitus.   Eyes: Negative for blurred vision and double vision.  Respiratory: Negative for cough, shortness of breath and wheezing.   Cardiovascular: Negative for chest pain, palpitations, orthopnea, claudication and leg swelling.  Gastrointestinal: Negative for abdominal pain, blood in stool, constipation, diarrhea, heartburn, melena, nausea and vomiting.  Genitourinary: Negative.   Musculoskeletal: Negative for joint pain and myalgias.  Skin: Negative for rash.  Neurological: Positive for tingling (chronic intermittent due to lupus). Negative for dizziness, sensory change, weakness and headaches.  Endo/Heme/Allergies: Negative for polydipsia.  Psychiatric/Behavioral: Negative.   All other systems reviewed and are negative.   Physical Exam: Estimated body mass index is 35.93 kg/m as calculated from the following:   Height as of this encounter: 5' 10.5" (1.791  m).   Weight as of this encounter: 254 lb (115.2 kg). BP 106/64   Pulse 65   Temp (!) 97.3 F (36.3 C)   Ht 5' 10.5" (1.791 m)   Wt 254 lb (115.2 kg)   SpO2 98%   BMI 35.93 kg/m  General Appearance: Well nourished, in no apparent distress.  Eyes: PERRLA, EOMs, conjunctiva no swelling or erythema  Sinuses: No Frontal/maxillary tenderness  ENT/Mouth: Ext aud canals clear, normal light reflex with TMs without  erythema, bulging. Good dentition. No erythema, swelling, or exudate on post pharynx. Tonsils not swollen or erythematous. Hearing normal.  Neck: Supple, thyroid normal. No bruits  Respiratory: Respiratory effort normal, BS equal bilaterally without rales, rhonchi, wheezing or stridor.  Cardio: RRR without murmurs, rubs or gallops. Brisk peripheral pulses without edema.  Chest: symmetric, with normal excursions and percussion.  Abdomen: Soft, nontender, no guarding, rebound, hernias, masses, or organomegaly.  Lymphatics: Non tender without lymphadenopathy.  Genitourinary: declines Musculoskeletal: Full ROM all peripheral extremities,5/5 strength, and normal gait.  Skin: Warm, dry; Scattered erythematous to velvety pink to violaceous circumscribed lesions occasionally excoriated and some mild plaque-like over the scalp, trunk & extremities. Left foot sole base of 5th MCP joint with small 0.5 cm open wound through full thickness of skin with ? Scant purulent discharge, thick calloused skin surrounding, tender no foul odor Neuro: Cranial nerves intact, reflexes equal bilaterally. Normal muscle tone, no cerebellar symptoms. Sensation intact.  Psych: Awake and oriented X 3, normal affect, Insight and Judgment appropriate.       EKG: sinus rhythm, RBBB and LAFB (verified NSCPT)  Gorden Harms Mikiah Demond 12:11 PM [facility]

## 2020-09-03 NOTE — Patient Instructions (Addendum)
Mr. Sean Maldonado , Thank you for taking time to come for your Annual Wellness Visit. I appreciate your ongoing commitment to your health goals. Please review the following plan we discussed and let me know if I can assist you in the future.   These are the goals we discussed: Goals    . Blood Pressure < 130/80    . HEMOGLOBIN A1C < 7    . LDL CALC < 70    . Weight (lb) < 250 lb (113.4 kg)       This is a list of the screening recommended for you and due dates:  Health Maintenance  Topic Date Due  . COVID-19 Vaccine (1) Never done  . Eye exam for diabetics  06/27/2018  . Complete foot exam   09/02/2020  . Flu Shot  12/07/2020  . Hemoglobin A1C  12/09/2020  . Colon Cancer Screening  07/31/2021  . Tetanus Vaccine  07/19/2022  . Pneumococcal vaccine  Completed  .  Hepatitis C: One time screening is recommended by Center for Disease Control  (CDC) for  adults born from [birth year] through 1964.   Completed  . HIV Screening  Completed  . HPV Vaccine  Aged Out      Know what a healthy weight is for you (roughly BMI <25) and aim to maintain this  Aim for 7+ servings of fruits and vegetables daily  65-80+ fluid ounces of water or unsweet tea for healthy kidneys  Limit to max 1 drink of alcohol per day; avoid smoking/tobacco  Limit animal fats in diet for cholesterol and heart health - choose grass fed whenever available  Avoid highly processed foods, and foods high in saturated/trans fats  Aim for low stress - take time to unwind and care for your mental health  Aim for 150 min of moderate intensity exercise weekly for heart health, and weights twice weekly for bone health  Aim for 7-9 hours of sleep daily    Wound care to do every day . Take off old bandage . Rinse off leg thoroughly in shower, wipe off any excess thick yellow discharge until you see a clean wound with some gauze or soft washcloth . Mix up betadine slurry: Combine sugar and betadine in a container that you can seal  and keep; mix ingredients to create a paste the consistency of peanut butter . Apply betadine and sugar mixture to wound to cover all edges, apply non-adherent dressing pads, then an absorbent dressing (gauze or ABD) to catch any excess leaking, then wrap with ace, gauze wrapping and or tape to hold dressing in place.  . Remember betadine will stain clothing and sheets - make sure to cover/protect any furniture or linens - you can put down absorbent chucks pad or trash bags    Wound Care, Adult Taking care of your wound properly can help to prevent pain, infection, and scarring. It can also help your wound heal more quickly. Follow instructions from your health care provider about how to care for your wound. Supplies needed:  Soap and water.  Wound cleanser.  Gauze.  If needed, a clean bandage (dressing) or other type of wound dressing material to cover or place in the wound. Follow your health care provider's instructions about what dressing supplies to use.  Cream or ointment to apply to the wound, if told by your health care provider. How to care for your wound Cleaning the wound Ask your health care provider how to clean the wound. This may  include:  Using mild soap and water or a wound cleanser.  Using a clean gauze to pat the wound dry after cleaning it. Do not rub or scrub the wound. Dressing care  Wash your hands with soap and water for at least 20 seconds before and after you change the dressing. If soap and water are not available, use hand sanitizer.  Change your dressing as told by your health care provider. This may include: ? Cleaning or rinsing out (irrigating) the wound. ? Placing a dressing over the wound or in the wound (packing). ? Covering the wound with an outer dressing.  Leave any stitches (sutures), skin glue, or adhesive strips in place. These skin closures may need to stay in place for 2 weeks or longer. If adhesive strip edges start to loosen and curl up,  you may trim the loose edges. Do not remove adhesive strips completely unless your health care provider tells you to do that.  Ask your health care provider when you can leave the wound uncovered. Checking for infection Check your wound area every day for signs of infection. Check for:  More redness, swelling, or pain.  Fluid or blood.  Warmth.  Pus or a bad smell.   Follow these instructions at home Medicines  If you were prescribed an antibiotic medicine, cream, or ointment, take or apply it as told by your health care provider. Do not stop using the antibiotic even if your condition improves.  If you were prescribed pain medicine, take it 30 minutes before you do any wound care or as told by your health care provider.  Take over-the-counter and prescription medicines only as told by your health care provider. Eating and drinking  Eat a diet that includes protein, vitamin A, vitamin C, and other nutrient-rich foods to help the wound heal. ? Foods rich in protein include meat, fish, eggs, dairy, beans, and nuts. ? Foods rich in vitamin A include carrots and dark green, leafy vegetables. ? Foods rich in vitamin C include citrus fruits, tomatoes, broccoli, and peppers.  Drink enough fluid to keep your urine pale yellow. General instructions  Do not take baths, swim, use a hot tub, or do anything that would put the wound underwater until your health care provider approves. Ask your health care provider if you may take showers. You may only be allowed to take sponge baths.  Do not scratch or pick at the wound. Keep it covered as told by your health care provider.  Return to your normal activities as told by your health care provider. Ask your health care provider what activities are safe for you.  Protect your wound from the sun when you are outside for the first 6 months, or for as long as told by your health care provider. Cover up the scar area or apply sunscreen that has an SPF  of at least 28.  Do not use any products that contain nicotine or tobacco, such as cigarettes, e-cigarettes, and chewing tobacco. These may delay wound healing. If you need help quitting, ask your health care provider.  Keep all follow-up visits as told by your health care provider. This is important. Contact a health care provider if:  You received a tetanus shot and you have swelling, severe pain, redness, or bleeding at the injection site.  Your pain is not controlled with medicine.  You have any of these signs of infection: ? More redness, swelling, or pain around the wound. ? Fluid or blood coming from  the wound. ? Warmth coming from the wound. ? Pus or a bad smell coming from the wound. ? A fever or chills.  You are nauseous or you vomit.  You are dizzy. Get help right away if:  You have a red streak of skin near the area around your wound.  Your wound has been closed with staples, sutures, skin glue, or adhesive strips and it begins to open up and separate.  Your wound is bleeding, and the bleeding does not stop with gentle pressure.  You have a rash.  You faint.  You have trouble breathing. These symptoms may represent a serious problem that is an emergency. Do not wait to see if the symptoms will go away. Get medical help right away. Call your local emergency services (911 in the [place] Do not drive yourself to the hospital. Summary  Always wash your hands with soap and water for at least 20 seconds before and after changing your dressing.  Change your dressing as told by your health care provider.  To help with healing, eat foods that are rich in protein, vitamin A, vitamin C, and other nutrients.  Check your wound every day for signs of infection. Contact your health care provider if you suspect that your wound is infected. This information is not intended to replace advice given to you by your health care provider. Make sure you discuss any questions you have  with your health care provider. Document Revised: 02/08/2019 Document Reviewed: 02/08/2019 Elsevier Patient Education  2021 [employer].

## 2020-09-04 ENCOUNTER — Encounter: Payer: Self-pay | Admitting: Adult Health

## 2020-09-04 DIAGNOSIS — G720 Drug-induced myopathy: Secondary | ICD-10-CM | POA: Insufficient documentation

## 2020-09-04 LAB — MICROALBUMIN / CREATININE URINE RATIO
Creatinine, Urine: 162 mg/dL (ref 20–320)
Microalb Creat Ratio: 7 mcg/mg creat (ref <30)
Microalb, Ur: 1.1 mg/dL

## 2020-09-04 LAB — CBC WITH DIFFERENTIAL/PLATELET
Absolute Monocytes: 780 cells/uL (ref 200–950)
Basophils Absolute: 58 cells/uL (ref 0–200)
Basophils Relative: 0.7 %
Eosinophils Absolute: 216 cells/uL (ref 15–500)
Eosinophils Relative: 2.6 %
HCT: 43.9 % (ref 38.5–50.0)
Hemoglobin: 14.9 g/dL (ref 13.2–17.1)
Lymphs Abs: 2067 cells/uL (ref 850–3900)
MCH: 32.7 pg (ref 27.0–33.0)
MCHC: 33.9 g/dL (ref 32.0–36.0)
MCV: 96.5 fL (ref 80.0–100.0)
MPV: 10.8 fL (ref 7.5–12.5)
Monocytes Relative: 9.4 %
Neutro Abs: 5179 cells/uL (ref 1500–7800)
Neutrophils Relative %: 62.4 %
Platelets: 259 10*3/uL (ref 140–400)
RBC: 4.55 10*6/uL (ref 4.20–5.80)
RDW: 12.4 % (ref 11.0–15.0)
Total Lymphocyte: 24.9 %
WBC: 8.3 10*3/uL (ref 3.8–10.8)

## 2020-09-04 LAB — COMPLETE METABOLIC PANEL WITH GFR
AG Ratio: 1.7 (calc) (ref 1.0–2.5)
ALT: 69 U/L — ABNORMAL HIGH (ref 9–46)
AST: 64 U/L — ABNORMAL HIGH (ref 10–35)
Albumin: 4.3 g/dL (ref 3.6–5.1)
Alkaline phosphatase (APISO): 54 U/L (ref 35–144)
BUN: 22 mg/dL (ref 7–25)
CO2: 28 mmol/L (ref 20–32)
Calcium: 9.4 mg/dL (ref 8.6–10.3)
Chloride: 100 mmol/L (ref 98–110)
Creat: 1.1 mg/dL (ref 0.70–1.25)
GFR, Est African American: 82 mL/min/{1.73_m2} (ref >=60)
GFR, Est Non African American: 71 mL/min/{1.73_m2} (ref >=60)
Globulin: 2.6 g/dL (calc) (ref 1.9–3.7)
Glucose, Bld: 109 mg/dL — ABNORMAL HIGH (ref 65–99)
Potassium: 4.3 mmol/L (ref 3.5–5.3)
Sodium: 136 mmol/L (ref 135–146)
Total Bilirubin: 0.9 mg/dL (ref 0.2–1.2)
Total Protein: 6.9 g/dL (ref 6.1–8.1)

## 2020-09-04 LAB — HEMOGLOBIN A1C
Hgb A1c MFr Bld: 6 % of total Hgb — ABNORMAL HIGH (ref <5.7)
Mean Plasma Glucose: 126 mg/dL
eAG (mmol/L): 7 mmol/L

## 2020-09-04 LAB — URINALYSIS, ROUTINE W REFLEX MICROSCOPIC
Bilirubin Urine: NEGATIVE
Glucose, UA: NEGATIVE
Hgb urine dipstick: NEGATIVE
Ketones, ur: NEGATIVE
Leukocytes,Ua: NEGATIVE
Nitrite: NEGATIVE
Protein, ur: NEGATIVE
Specific Gravity, Urine: 1.019 (ref 1.001–1.035)
pH: 5.5 (ref 5.0–8.0)

## 2020-09-04 LAB — TSH: TSH: 3.11 mIU/L (ref 0.40–4.50)

## 2020-09-04 LAB — LIPID PANEL
Cholesterol: 191 mg/dL (ref <200)
HDL: 30 mg/dL — ABNORMAL LOW (ref >=40)
LDL Cholesterol (Calc): 132 mg/dL (calc) — ABNORMAL HIGH
Non-HDL Cholesterol (Calc): 161 mg/dL (calc) — ABNORMAL HIGH (ref <130)
Total CHOL/HDL Ratio: 6.4 (calc) — ABNORMAL HIGH (ref <5.0)
Triglycerides: 159 mg/dL — ABNORMAL HIGH (ref <150)

## 2020-09-04 LAB — PSA: PSA: 0.28 ng/mL (ref <=4.00)

## 2020-09-04 LAB — MAGNESIUM: Magnesium: 2.1 mg/dL (ref 1.5–2.5)

## 2020-09-04 LAB — URIC ACID: Uric Acid, Serum: 5.8 mg/dL (ref 4.0–8.0)

## 2020-09-04 LAB — VITAMIN D 25 HYDROXY (VIT D DEFICIENCY, FRACTURES): Vit D, 25-Hydroxy: 90 ng/mL (ref 30–100)

## 2020-09-08 ENCOUNTER — Encounter: Payer: BC Managed Care – PPO | Admitting: Internal Medicine

## 2020-09-30 ENCOUNTER — Other Ambulatory Visit: Payer: Self-pay | Admitting: Internal Medicine

## 2020-10-28 ENCOUNTER — Other Ambulatory Visit: Payer: Self-pay | Admitting: Adult Health

## 2020-10-28 ENCOUNTER — Encounter: Payer: Self-pay | Admitting: Adult Health

## 2020-10-28 ENCOUNTER — Ambulatory Visit
Admission: RE | Admit: 2020-10-28 | Discharge: 2020-10-28 | Disposition: A | Discharge status: Home / Self Care | Payer: No Typology Code available for payment source | Source: Ambulatory Visit | Attending: Adult Health | Admitting: Adult Health

## 2020-10-28 DIAGNOSIS — R931 Abnormal findings on diagnostic imaging of heart and coronary circulation: Secondary | ICD-10-CM

## 2020-10-28 DIAGNOSIS — E785 Hyperlipidemia, unspecified: Secondary | ICD-10-CM | POA: Diagnosis not present

## 2020-10-28 DIAGNOSIS — E1169 Type 2 diabetes mellitus with other specified complication: Secondary | ICD-10-CM

## 2020-10-28 DIAGNOSIS — I77810 Thoracic aortic ectasia: Secondary | ICD-10-CM | POA: Insufficient documentation

## 2020-11-02 DIAGNOSIS — M329 Systemic lupus erythematosus, unspecified: Secondary | ICD-10-CM | POA: Diagnosis not present

## 2020-11-02 DIAGNOSIS — M3219 Other organ or system involvement in systemic lupus erythematosus: Secondary | ICD-10-CM | POA: Diagnosis not present

## 2021-01-01 ENCOUNTER — Other Ambulatory Visit: Payer: Self-pay | Admitting: Internal Medicine

## 2021-01-01 DIAGNOSIS — G63 Polyneuropathy in diseases classified elsewhere: Secondary | ICD-10-CM

## 2021-02-02 ENCOUNTER — Other Ambulatory Visit: Payer: Self-pay

## 2021-02-02 ENCOUNTER — Encounter: Payer: Self-pay | Admitting: Cardiovascular Disease

## 2021-02-02 ENCOUNTER — Ambulatory Visit: Payer: BC Managed Care – PPO | Admitting: Cardiovascular Disease

## 2021-02-02 DIAGNOSIS — I10 Essential (primary) hypertension: Secondary | ICD-10-CM

## 2021-02-02 DIAGNOSIS — R931 Abnormal findings on diagnostic imaging of heart and coronary circulation: Secondary | ICD-10-CM | POA: Diagnosis not present

## 2021-02-02 DIAGNOSIS — E1169 Type 2 diabetes mellitus with other specified complication: Secondary | ICD-10-CM | POA: Diagnosis not present

## 2021-02-02 DIAGNOSIS — E785 Hyperlipidemia, unspecified: Secondary | ICD-10-CM | POA: Diagnosis not present

## 2021-02-02 NOTE — Patient Instructions (Signed)
Medication Instructions:  Your physician recommends that you continue on your current medications as directed. Please refer to the Current Medication list given to you today.  *If you need a refill on your cardiac medications before your next appointment, please call your pharmacy*   Testing/Procedures: Your physician has requested that you have a lexiscan myoview. For further information please visit [email or web address]. Please follow instruction sheet, as given. This will take place at [address], [address]  How to prepare for your Myocardial Perfusion Test: Do not eat or drink 3 hours prior to your test, except you may have water. Do not consume products containing caffeine (regular or decaffeinated) 12 hours prior to your test. (ex: coffee, chocolate, sodas, tea). Do bring a list of your current medications with you.  If not listed below, you may take your medications as normal. Do wear comfortable clothes (no dresses or overalls) and walking shoes, tennis shoes preferred (No heels or open toe shoes are allowed). Do NOT wear cologne, perfume, aftershave, or lotions (deodorant is allowed). The test will take approximately 3 to 4 hours to complete If these instructions are not followed, your test will have to be rescheduled.    Follow-Up: At [facility], you and your health needs are our priority.  As part of our continuing mission to provide you with exceptional heart care, we have created designated Provider Care Teams.  These Care Teams include your primary Cardiologist (physician) and Advanced Practice Providers (APPs -  Physician Assistants and Nurse Practitioners) who all work together to provide you with the care you need, when you need it.  We recommend signing up for the patient portal called "MyChart".  Sign up information is provided on this After Visit Summary.  MyChart is used to connect with patients for Virtual Visits (Telemedicine).  Patients are able to view  lab/test results, encounter notes, upcoming appointments, etc.  Non-urgent messages can be sent to your provider as well.   To learn more about what you can do with MyChart, go to [email or web address].    Your next appointment:   12 month(s)  The format for your next appointment:   In Person  Provider:   Quay Burow, MD   Other Instructions Referral made to Dr. Debara Pickett in the lipid clinic.

## 2021-02-02 NOTE — Assessment & Plan Note (Signed)
History of hyperlipidemia intolerant to statin therapy on acetamide with recent lipid profile performed 08/26/2020 revealing total cholesterol of 191, LDL 132 and HDL 30.  Because of his elevated coronary calcium score he should have a LDL of less than 70.  I am referring him to Dr. Debara Pickett for consideration of initiation of a PCSK9.

## 2021-02-02 NOTE — Progress Notes (Signed)
02/02/2021 [place]   [birth year]  [id number]  Primary Physician [name], MD Primary Cardiologist: Lorretta Harp MD FACP, [place], [place], [place]  HPI:  Sean Maldonado is a 65 y.o. 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  He was referred by Dr. Melford Aase  for cardiovascular valuation because of elevated coronary calcium score.  His cardiac risk factor profile is notable for treated hypertension and hyperlipidemia.  He is statin intolerant of ezetimibe.  He does have a history of SLE.  He is fairly active and plays golf and walks.  There is no family history of heart disease.  Is never had a heart attack or stroke.  He denies chest pain or shortness of breath.  He did have a coronary calcium score performed 10/28/2020 which was 2234 with disease spread Alprep in all 3 coronary arteries.  He is completely asymptomatic.   Current Meds  Medication Sig   allopurinol (ZYLOPRIM) 300 MG tablet Take 1 tablet Daily to Prevent Gout   aspirin EC 81 MG tablet Take 81 mg by mouth daily.    atenolol (TENORMIN) 100 MG tablet TAKE ONE TABLET BY MOUTH DAILY FOR FOR BLOOD PRESSURE   Cholecalciferol (VITAMIN D3) 5000 UNITS CAPS Take 10,000 Units by mouth daily.    ezetimibe (ZETIA) 10 MG tablet Take 1 tablet Daily for Cholesterol   gabapentin (NEURONTIN) 100 MG capsule Take 1 capsule 3 x /day as needed for Neuropathy Pain   lisinopril-hydrochlorothiazide (ZESTORETIC) 20-25 MG tablet Take 1 tablet Daily for BP & Fluid Retention /Ankle Swelling   Magnesium 400 MG TABS Take 1 tablet by mouth daily.   MILK THISTLE PO Take 1 tablet by mouth daily.   Multiple Vitamin (MULTIVITAMIN) capsule Take 1 capsule by mouth daily.   Omega-3 Fatty Acids (FISH OIL PO) Take 1 capsule by mouth daily.   OVER THE COUNTER MEDICATION Takes Alphalophoric Acid 1 capsule daily   predniSONE (DELTASONE) 10 MG tablet TAKE 1 OR 2 TABLETS DAILY AS DIRECTED BY  DR. (Patient taking differently: Take 5 mg by mouth daily.)   Probiotic Product (PROBIOTIC DAILY) CAPS Take 1 capsule by mouth daily.   Red Yeast Rice Extract (RED YEAST RICE PO) Take 1,200 mg by mouth daily.   triamcinolone cream (KENALOG) 0.1 % Apply 1 application topically 3 (three) times daily. (Patient taking differently: Apply 1 application topically as needed.)   TURMERIC PO Take 2,000 mg by mouth daily.    zinc gluconate 50 MG tablet Take 50 mg by mouth daily.     Allergies  Allergen Reactions   Doxycycline     Lupus flair   Imuran [Azathioprine] Rash   Lipitor [Atorvastatin]     Myalgias    Penicillins     Causes lupus flare ups   Rosuvastatin Diarrhea   Zocor [Simvastatin]     Myalgias    Social History   Socioeconomic History   Marital status: Single    Spouse name: Not on file   Number of children: 0   Years of education: Not on file   Highest education level: Not on file  Occupational History   Occupation: [employer]    Employer: OTHER  Tobacco Use   Smoking status: Never   Smokeless tobacco: Never  Substance and Sexual Activity   Alcohol use: Yes    Comment: occasional   Drug use: No   Sexual activity: Not on file  Other Topics Concern   Not on file  Social History Narrative   Not on file   Social Determinants of Health   Financial Resource Strain: Not on file  Food Insecurity: Not on file  Transportation Needs: Not on file  Physical Activity: Not on file  Stress: Not on file  Social Connections: Not on file  Intimate Partner Violence: Not on file     Review of Systems: General: negative for chills, fever, night sweats or weight changes.  Cardiovascular: negative for chest pain, dyspnea on exertion, edema, orthopnea, palpitations, paroxysmal nocturnal dyspnea or shortness of breath Dermatological: negative for rash Respiratory: negative for cough or wheezing Urologic: negative for hematuria Abdominal: negative for nausea,  vomiting, diarrhea, bright red blood per rectum, melena, or hematemesis Neurologic: negative for visual changes, syncope, or dizziness All other systems reviewed and are otherwise negative except as noted above.    Blood pressure 118/84, pulse 80, height 6' (1.829 m), weight 253 lb 12.8 oz (115.1 kg), SpO2 95 %.  General appearance: alert and no distress Neck: no adenopathy, no carotid bruit, no JVD, supple, symmetrical, trachea midline, and thyroid not enlarged, symmetric, no tenderness/mass/nodules Lungs: clear to auscultation bilaterally Heart: regular rate and rhythm, S1, S2 normal, no murmur, click, rub or gallop Extremities: extremities normal, atraumatic, no cyanosis or edema Pulses: 2+ and symmetric Skin: Skin color, texture, turgor normal. 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  I personally reviewed this EKG.  ASSESSMENT AND PLAN:   Hyperlipidemia associated with type 2 diabetes mellitus ([place]) History of hyperlipidemia intolerant to statin therapy on acetamide with recent lipid profile performed 08/26/2020 revealing total cholesterol of 191, LDL 132 and HDL 30.  Because of his elevated coronary calcium score he should have a LDL of less than 70.  I am referring him to Dr. Debara Pickett for consideration of initiation of a PCSK9.  Hypertension History of essential hypertension a blood pressure measured today at 118/84.  He is on atenolol 50 mg a day as well as lisinopril and hydrochlorothiazide.  Agatston coronary artery calcium score greater than 400 History of elevated coronary calcium score of 2234 measured on 10/28/2020.  He is completely asymptomatic and is fairly active.  I am going get a Lexiscan Myoview to further evaluate.     Lorretta Harp MD FACP,FACC,FAHA, [facility] 02/02/2021 3:22 PM

## 2021-02-02 NOTE — Assessment & Plan Note (Signed)
History of essential hypertension a blood pressure measured today at 118/84.  He is on atenolol 50 mg a day as well as lisinopril and hydrochlorothiazide.

## 2021-02-02 NOTE — Assessment & Plan Note (Signed)
History of elevated coronary calcium score of 2234 measured on 10/28/2020.  He is completely asymptomatic and is fairly active.  I am going get a Lexiscan Myoview to further evaluate.

## 2021-02-04 ENCOUNTER — Telehealth (HOSPITAL_COMMUNITY): Payer: Self-pay | Admitting: *Deleted

## 2021-02-04 NOTE — Telephone Encounter (Signed)
Close encounter 

## 2021-02-05 ENCOUNTER — Ambulatory Visit (HOSPITAL_COMMUNITY)
Admission: RE | Admit: 2021-02-05 | Discharge: 2021-02-05 | Disposition: A | Discharge status: Home / Self Care | Payer: BC Managed Care – PPO | Source: Ambulatory Visit | Attending: Cardiology | Admitting: Cardiology

## 2021-02-05 ENCOUNTER — Other Ambulatory Visit: Payer: Self-pay

## 2021-02-05 DIAGNOSIS — E1169 Type 2 diabetes mellitus with other specified complication: Secondary | ICD-10-CM | POA: Insufficient documentation

## 2021-02-05 DIAGNOSIS — R931 Abnormal findings on diagnostic imaging of heart and coronary circulation: Secondary | ICD-10-CM | POA: Insufficient documentation

## 2021-02-05 DIAGNOSIS — E785 Hyperlipidemia, unspecified: Secondary | ICD-10-CM | POA: Diagnosis not present

## 2021-02-05 LAB — MYOCARDIAL PERFUSION IMAGING
LV dias vol: 126 mL (ref 62–150)
LV sys vol: 65 mL
Nuc Stress EF: 48 %
Peak HR: 79 {beats}/min
Rest HR: 67 {beats}/min
Rest Nuclear Isotope Dose: 10.5 mCi
SDS: 2
SRS: 1
SSS: 3
ST Depression (mm): 0 mm
Stress Nuclear Isotope Dose: 32.1 mCi
TID: 1.11

## 2021-02-05 MED ORDER — TECHNETIUM TC 99M TETROFOSMIN IV KIT
32.1000 | PACK | Freq: Once | INTRAVENOUS | Status: AC | PRN
  Administered 2021-02-05: 32.1 via INTRAVENOUS
  Filled 2021-02-05: qty 33

## 2021-02-05 MED ORDER — REGADENOSON 0.4 MG/5ML IV SOLN
0.4000 mg | Freq: Once | INTRAVENOUS | Status: AC
  Administered 2021-02-05: 0.4 mg via INTRAVENOUS

## 2021-02-05 MED ORDER — TECHNETIUM TC 99M TETROFOSMIN IV KIT
10.5000 | PACK | Freq: Once | INTRAVENOUS | Status: AC | PRN
  Administered 2021-02-05: 10.5 via INTRAVENOUS
  Filled 2021-02-05: qty 11

## 2021-03-05 NOTE — Progress Notes (Signed)
3 MONTH FOLLOW UP  Assessment and Plan:   Aortic atherosclerosis ([place])- CT 01/2020 Control blood pressure, cholesterol, glucose, increase exercise.  -     Lipid panel  Ascending aorta dilation ([name]) - CT 10/2020 Control BP, follow up 1 year  CAD/ [name] CCS 400+ Dr. Gwenlyn Maldonado following, pending [id number] evaluation Declines other med attempts Control blood pressure, cholesterol, glucose, increase exercise.  Denies chest pain  Systemic lupus erythematosus with other organ involvement, unspecified SLE type ([place]) Follows with Sean Maldonado rheum, prednisone Doesn't tolerate most abx - trigger flares; zpak is ok per patient -     CBC with Differential/Platelet  Polyneuropathy associated with lupus ([place]) Check feet daily, secondary to lupus, declines podiatry referral   Obesity - BMI 22 Long discussion about weight loss, diet, and exercise Recommended diet heavy in fruits and veggies and low in animal meats, cheeses, and dairy products, appropriate calorie intake Discussed appropriate weight for height  Follow up at next visit  CKD stage 2 due to type 2 diabetes mellitus ([name]) Increase fluids, avoid NSAIDS, monitor sugars, will monitor -     COMPLETE METABOLIC PANEL WITH GFR  Hyperlipidemia associated with type 2 diabetes mellitus ([name]) Continue medications: zetia, pending PCSK9i consideration by Dr. Debara Maldonado  Intolerance of several meds, nexletol tolerated but insurance wouldn't cover  Check lipid panel.  -     Lipid panel -     TSH  Type 2 diabetes mellitus with stage 2 chronic kidney disease, without long-term current use of insulin (Sean Maldonado) Declines metformin, working on lifestyle to try to control without meds per strong patient preference Education: Reviewed 'ABCs' of diabetes management (respective goals in parentheses):  A1C (<7), blood pressure (<130/80), and cholesterol (LDL <70) Eye Exam yearly and Dental Exam every 6 months. Dietary recommendations Physical Activity recommendations -  given foot care handout and explained the principles  -     COMPLETE METABOLIC PANEL WITH GFR -     Hemoglobin A1c  NASH (nonalcoholic steatohepatitis) Weight loss advised, avoid alcohol/tylenol, will monitor LFTs -     COMPLETE METABOLIC PANEL WITH GFR  Primary hypertension Continue medication Monitor blood pressure at home; call if consistently over 130/80 Continue DASH diet.   Reminder to go to the ER if any CP, SOB, nausea, dizziness, severe HA, changes vision/speech, left arm numbness and tingling and jaw pain. -     CBC with Differential/Platelet -     COMPLETE METABOLIC PANEL WITH GFR -     Magnesium  Mild intermittent asthma without complication Monitor, avoid triggers, albuterol PRN -     CBC with Differential/Platelet  Gastroesophageal reflux disease, unspecified whether esophagitis present Well managed on current medications Discussed diet, avoiding triggers and other lifestyle changes -     COMPLETE METABOLIC PANEL WITH GFR -     Magnesium  Anxiety Well managed by current regimen; benzo PRN rare use  continue medications Stress management techniques discussed, increase water, good sleep hygiene discussed, increase exercise, and increase veggies.   Idiopathic gout, unspecified chronicity, unspecified site Continue allopurinol  Vitamin D deficiency Continue supplement    Discussed med's effects and SE's. Labs and tests as requested with regular follow-up as recommended. Over 30 minutes of exam, counseling, chart review and critical decision making was performed  Future Appointments  Date Time Provider [place]  07/06/2021  8:15 AM Pixie Casino, MD [facility]  09/03/2021  9:00 AM [name], NP [facility] None      [name]  presents for 3 month follow up for morbid obesity, T2DM, hld, htn, gout, vitamin D.    Patient was diagnosed with cutaneous Lupus in 2017 and is followed at [facility]  Aug 2018 patient had a severe  intolerant or allergic reaction to Imuran. Since then his cutaneous Lupus symptoms typically reasonably controlled on Prednisone 5 mg daily, recently increased dose due to flare after taper attempt. He has some related peripheral neuropathic pain, reports well managed with gabapentin 100 mg PRN. He is on rare PRN xanax for anxiety exacerbated by prednisone.     BMI is Body mass index is 33.91 kg/m., he has been working on diet and exercise. Wt Readings from Last 3 Encounters:  03/09/21 250 lb (113.4 kg)  02/05/21 253 lb (114.8 kg)  02/02/21 253 lb 12.8 oz (115.1 kg)   He has aortic atherosclerosis per Ct 01/2020.  He had a coronary calcium score performed 10/28/2020 which was 2234 with disease spread Alprep in all 3 coronary arteries and was evaluated by Dr. Gwenlyn Maldonado. Had myoview 02/05/2021 showing no previous MI/ischemia, however intermediate risk with reduced systolic function. EF 48%.   His blood pressure has been controlled at home, today their BP is BP: 112/72 He does workout. He denies chest pain, shortness of breath, dizziness.   He is on cholesterol medication (statin intolerant; on zetia 10 mg daily, on RYRS, nexlizet declined by insurance; pending evaluation by Dr. Debara Maldonado for [id number]) and denies myalgias.  His cholesterol is not at goal. The cholesterol last visit was:   Lab Results  Component Value Date   CHOL 191 09/03/2020   HDL 30 (L) 09/03/2020   LDLCALC 132 (H) 09/03/2020   TRIG 159 (H) 09/03/2020   CHOLHDL 6.4 (H) 09/03/2020   He has been working on diet and exercise for T2 diabetes (A1C of 8% on 06/11/2020) now controlled by lifestyle, he is on bASA, he is on ACE/ARB and denies increased appetite, nausea, polydipsia, polyuria and visual disturbances. He has neuropathy attributed to lupus, predates T2DM diagnosis. He is prescribed metformin, diarrhea with low dose and not taking, declines. Last A1C in the office was:  Lab Results  Component Value Date   HGBA1C 6.0 (H) 09/03/2020    CKD 2 on  Last GFR: Lab Results  Component Value Date   GFRNONAA 71 09/03/2020    Patient is on Vitamin D supplement, taking 10000 IU daily.    Lab Results  Component Value Date   VD25OH 90 09/03/2020     Patient is on allopurinol for gout and does not report a recent flare.  Lab Results  Component Value Date   LABURIC 5.8 09/03/2020     Current Medications:  Current Outpatient Medications on File Prior to Visit  Medication Sig Dispense Refill   allopurinol (ZYLOPRIM) 300 MG tablet Take 1 tablet Daily to Prevent Gout 90 tablet 3   ALPRAZolam (XANAX) 1 MG tablet Take      1/2 - 1 tablet      2 - 3 x /day      ONLY if needed for Anxiety Attack &  limit to 5 days /week to avoid Addiction & Dementia (Patient not taking: Reported on 02/02/2021) 90 tablet 0   aspirin EC 81 MG tablet Take 81 mg by mouth daily.      atenolol (TENORMIN) 100 MG tablet TAKE ONE TABLET BY MOUTH DAILY FOR FOR BLOOD PRESSURE 90 tablet 0   celecoxib (CELEBREX) 200 MG capsule Take 1 capsule daily as needed  for Pain & Inflammation (Patient not taking: Reported on 02/02/2021) 90 capsule 1   Cholecalciferol (VITAMIN D3) 5000 UNITS CAPS Take 10,000 Units by mouth daily.      ezetimibe (ZETIA) 10 MG tablet Take 1 tablet Daily for Cholesterol 90 tablet 1   gabapentin (NEURONTIN) 100 MG capsule Take 1 capsule 3 x /day as needed for Neuropathy Pain 270 capsule 0   hydrOXYzine (ATARAX/VISTARIL) 25 MG tablet TAKE 2 TABLETS THREE TIMES DAILY AS NEEDED FOR ITCHING. (Patient not taking: Reported on 02/02/2021) 60 tablet 0   lisinopril-hydrochlorothiazide (ZESTORETIC) 20-25 MG tablet Take 1 tablet Daily for BP & Fluid Retention /Ankle Swelling 90 tablet 1   Magnesium 400 MG TABS Take 1 tablet by mouth daily.     metFORMIN (GLUCOPHAGE XR) 500 MG 24 hr tablet Start taking 1 tab daily with largest meal of the day for sugars/diabetes; if tolerating well in 2 weeks add a second dose to your next largest meal. (Patient not taking:  Reported on 02/02/2021) 180 tablet 0   MILK THISTLE PO Take 1 tablet by mouth daily.     Multiple Vitamin (MULTIVITAMIN) capsule Take 1 capsule by mouth daily.     mupirocin cream (BACTROBAN) 2 % Apply 1 application topically 3 (three) times daily. For 7-14 days (Patient not taking: Reported on 02/02/2021) 30 g 2   Omega-3 Fatty Acids (FISH OIL PO) Take 1 capsule by mouth daily.     OVER THE COUNTER MEDICATION Takes Alphalophoric Acid 1 capsule daily     Probiotic Product (PROBIOTIC DAILY) CAPS Take 1 capsule by mouth daily.     Red Yeast Rice Extract (RED YEAST RICE PO) Take 1,200 mg by mouth daily.     triamcinolone cream (KENALOG) 0.1 % Apply 1 application topically 3 (three) times daily. (Patient taking differently: Apply 1 application topically as needed.) 30 g 0   TURMERIC PO Take 2,000 mg by mouth daily.      zinc gluconate 50 MG tablet Take 50 mg by mouth daily.     No current facility-administered medications on file prior to visit.   Allergies:  Allergies  Allergen Reactions   Doxycycline     Lupus flair   Imuran [Azathioprine] Rash   Lipitor [Atorvastatin]     Myalgias    Penicillins     Causes lupus flare ups   Rosuvastatin Diarrhea   Zocor [Simvastatin]     Myalgias    Medical History:  has GERD (gastroesophageal reflux disease); NASH (nonalcoholic steatohepatitis); Asthma; Seasonal allergies; Hyperlipidemia associated with type 2 diabetes mellitus ([place]); Hypertension; Diet-controlled type 2 diabetes mellitus ([place]); Vitamin D deficiency; Medication management; Gout; Lupus (systemic lupus erythematosus) ([place]); 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 Surgical History:  He  has a past surgical history that includes Tonsillectomy; Wisdom tooth extraction; Axillary lymph node biopsy (Right, 08/27/2012); Cholecystectomy (N/A, 12/28/2012); and ORIF wrist fracture (Left, 01/17/2020). Family History:  His family history includes Diabetes in his mother; Heart disease in his mother. Social History:   reports that he has never smoked. He has never used smokeless tobacco. He reports current alcohol use. He reports that he does not use  drugs.  Review of Systems:  Review of Systems  Constitutional:  Negative for malaise/fatigue and weight loss.  HENT:  Negative for hearing loss and tinnitus.   Eyes:  Negative for blurred vision and double vision.  Respiratory:  Negative for cough, shortness of breath and wheezing.   Cardiovascular:  Negative for chest pain, palpitations, orthopnea, claudication and leg swelling.  Gastrointestinal:  Negative for abdominal pain, blood in stool, constipation, diarrhea, heartburn, melena, nausea and vomiting.  Genitourinary: Negative.   Musculoskeletal:  Negative for joint pain and myalgias.  Skin:  Positive for rash (lupus).  Neurological:  Positive for tingling (chronic intermittent due to lupus). Negative for dizziness, sensory change, weakness and headaches.  Endo/Heme/Allergies:  Negative for polydipsia.  Psychiatric/Behavioral: Negative.    All other systems reviewed and are negative.  Physical Exam: Estimated body mass index is 33.91 kg/m as calculated from the following:   Height as of 02/05/21: 6' (1.829 m).   Weight as of this encounter: 250 lb (113.4 kg). BP 112/72   Pulse 80   Temp 97.9 F (36.6 C)   Wt 250 lb (113.4 kg)   SpO2 99%   BMI 33.91 kg/m  General Appearance: Well nourished, in no apparent distress.  Eyes: PERRLA, EOMs, conjunctiva no swelling or erythema  Sinuses: No Frontal/maxillary tenderness   ENT/Mouth: Ext aud canals clear, normal light reflex with TMs without erythema, bulging. Good dentition. No erythema, swelling, or exudate on post pharynx. Tonsils not swollen or erythematous. Hearing normal.  Neck: Supple, thyroid normal. No bruits  Respiratory: Respiratory effort normal, BS equal bilaterally without rales, rhonchi, wheezing or stridor.  Cardio: RRR without murmurs, rubs or gallops. Brisk peripheral pulses without edema.  Chest: symmetric, with normal excursions and percussion.  Abdomen: Soft, nontender, no guarding, rebound, hernias, masses, or organomegaly.  Lymphatics: Non tender without lymphadenopathy.  Genitourinary: declines Musculoskeletal: Full ROM all peripheral extremities,5/5 strength, and normal gait.  Skin: Warm, dry; Scattered erythematous to velvety pink to violaceous circumscribed lesions occasionally excoriated and some mild plaque-like over the scalp, trunk & extremities.  Neuro: Cranial nerves intact, reflexes equal bilaterally. Normal muscle tone, no cerebellar symptoms. Sensation intact.  Psych: Awake and oriented X 3, normal affect, Insight and Judgment appropriate.   Izora Ribas, NP 11:22 AM [facility]

## 2021-03-09 ENCOUNTER — Other Ambulatory Visit: Payer: Self-pay

## 2021-03-09 ENCOUNTER — Encounter: Payer: Self-pay | Admitting: Adult Health

## 2021-03-09 ENCOUNTER — Ambulatory Visit (INDEPENDENT_AMBULATORY_CARE_PROVIDER_SITE_OTHER): Payer: BC Managed Care – PPO | Admitting: Adult Health

## 2021-03-09 VITALS — BP 112/72 | HR 80 | Temp 97.9°F | Wt 250.0 lb

## 2021-03-09 DIAGNOSIS — Z79899 Other long term (current) drug therapy: Secondary | ICD-10-CM | POA: Diagnosis not present

## 2021-03-09 DIAGNOSIS — E559 Vitamin D deficiency, unspecified: Secondary | ICD-10-CM

## 2021-03-09 DIAGNOSIS — E785 Hyperlipidemia, unspecified: Secondary | ICD-10-CM | POA: Diagnosis not present

## 2021-03-09 DIAGNOSIS — I77810 Thoracic aortic ectasia: Secondary | ICD-10-CM

## 2021-03-09 DIAGNOSIS — I700 Atherosclerosis of aorta: Secondary | ICD-10-CM | POA: Diagnosis not present

## 2021-03-09 DIAGNOSIS — E1169 Type 2 diabetes mellitus with other specified complication: Secondary | ICD-10-CM | POA: Diagnosis not present

## 2021-03-09 DIAGNOSIS — E1122 Type 2 diabetes mellitus with diabetic chronic kidney disease: Secondary | ICD-10-CM

## 2021-03-09 DIAGNOSIS — E119 Type 2 diabetes mellitus without complications: Secondary | ICD-10-CM | POA: Diagnosis not present

## 2021-03-09 DIAGNOSIS — M1000 Idiopathic gout, unspecified site: Secondary | ICD-10-CM

## 2021-03-09 DIAGNOSIS — I10 Essential (primary) hypertension: Secondary | ICD-10-CM | POA: Diagnosis not present

## 2021-03-09 DIAGNOSIS — N182 Chronic kidney disease, stage 2 (mild): Secondary | ICD-10-CM

## 2021-03-09 DIAGNOSIS — E11628 Type 2 diabetes mellitus with other skin complications: Secondary | ICD-10-CM

## 2021-03-09 DIAGNOSIS — E669 Obesity, unspecified: Secondary | ICD-10-CM

## 2021-03-09 DIAGNOSIS — R931 Abnormal findings on diagnostic imaging of heart and coronary circulation: Secondary | ICD-10-CM

## 2021-03-09 MED ORDER — PREDNISONE 5 MG PO TABS: ORAL_TABLET | ORAL | 1 refills | Status: AC

## 2021-03-09 MED ORDER — ALPRAZOLAM 1 MG PO TABS: ORAL_TABLET | ORAL | 0 refills | Status: DC

## 2021-03-09 NOTE — Patient Instructions (Signed)
Antiinflammatory diet  Doctors are learning that one of the best ways to reduce inflammation lies not in the medicine cabinet, but in the refrigerator.  By following an anti-inflammatory diet you can fight off inflammation for good.  What does an anti-inflammatory diet do? Your immune system becomes activated when your body recognizes anything that is foreign--such as an invading microbe, plant pollen, or chemical. This often triggers a process called inflammation. Intermittent bouts of inflammation directed at truly threatening invaders protect your health.  However, sometimes inflammation persists, day in and day out, even when you are not threatened by a foreign invader. That's when inflammation can become your enemy. Many major diseases that plague us--including cancer, heart disease, diabetes, arthritis, depression, and Alzheimer's--have been linked to chronic inflammation.  One of the most powerful tools to combat inflammation comes not from the pharmacy, but from the grocery store.   Protect yourself from the damage of chronic inflammation. Science has proven that chronic, low-grade inflammation can turn into a silent killer that contributes to cardiovascular disease, cancer, type 2 diabetes and other conditions.   Choose the right anti-inflammatory foods, and you may be able to reduce your risk of illness. Consistently pick the wrong ones, and you could accelerate the inflammatory disease process.     Foods that cause inflammation Try to avoid or limit these foods as much as possible:  refined carbohydrates, such as white bread and pastries [place] fries and other fried foods soda and other sugar-sweetened beverages red meat (burgers, steaks) and processed meat (hot dogs, sausage) margarine, shortening, and lard  The health risks of inflammatory foods Not surprisingly, the same foods on an inflammation diet are generally considered bad for our health, including sodas and  refined carbohydrates, as well as red meat and processed meats.  "Some of the foods that have been associated with an increased risk for chronic diseases such as type 2 diabetes and heart disease are also associated with excess inflammation," Dr. Melanee Maldonado says. "It's not surprising, since inflammation is an important underlying mechanism for the development of these diseases."  Unhealthy foods also contribute to weight gain, which is itself a risk factor for inflammation. Yet in several studies, even after researchers took obesity into account, the link between foods and inflammation remained, which suggests weight gain isn't the sole driver. "Some of the food components or ingredients may have independent effects on inflammation over and above increased caloric intake," Dr. Melanee Maldonado says.  Anti-inflammatory foods An anti-inflammatory diet should include these foods:  tomatoes olive oil green leafy vegetables, such as spinach, kale, and collards nuts like almonds and walnuts fatty fish like salmon, mackerel, tuna, and sardines fruits such as strawberries, blueberries, cherries, and oranges  Benefits of anti-inflammatory foods On the flip side are beverages and foods that reduce inflammation, in particular fruits and vegetables such as blueberries, apples, and leafy greens that are high in natural antioxidants and polyphenols--protective compounds found in plants.  Studies have also associated nuts with reduced markers of inflammation and a lower risk of cardiovascular disease and diabetes. Coffee, which contains polyphenols and other anti-inflammatory compounds, may protect against inflammation, as well.  Anti-inflammatory diet To reduce levels of inflammation, aim for an overall healthy diet. If you're looking for an eating plan that closely follows the tenets of anti-inflammatory eating, consider the Mediterranean diet, which is high in fruits, vegetables, nuts, whole grains, fish, and healthy  oils.  In addition to lowering inflammation, a more natural, less processed diet can  have noticeable effects on your physical and emotional health and overall quality of life.   https://[email or web address].[employer].fi

## 2021-03-10 ENCOUNTER — Other Ambulatory Visit: Payer: Self-pay | Admitting: Adult Health

## 2021-03-10 DIAGNOSIS — R748 Abnormal levels of other serum enzymes: Secondary | ICD-10-CM

## 2021-03-10 LAB — COMPLETE METABOLIC PANEL WITH GFR
AG Ratio: 1.4 (calc) (ref 1.0–2.5)
ALT: 113 U/L — ABNORMAL HIGH (ref 9–46)
AST: 134 U/L — ABNORMAL HIGH (ref 10–35)
Albumin: 3.8 g/dL (ref 3.6–5.1)
Alkaline phosphatase (APISO): 59 U/L (ref 35–144)
BUN: 17 mg/dL (ref 7–25)
CO2: 30 mmol/L (ref 20–32)
Calcium: 9.2 mg/dL (ref 8.6–10.3)
Chloride: 100 mmol/L (ref 98–110)
Creat: 1.03 mg/dL (ref 0.70–1.35)
Globulin: 2.7 g/dL (calc) (ref 1.9–3.7)
Glucose, Bld: 116 mg/dL — ABNORMAL HIGH (ref 65–99)
Potassium: 3.9 mmol/L (ref 3.5–5.3)
Sodium: 137 mmol/L (ref 135–146)
Total Bilirubin: 0.7 mg/dL (ref 0.2–1.2)
Total Protein: 6.5 g/dL (ref 6.1–8.1)
eGFR: 81 mL/min/{1.73_m2} (ref >=60)

## 2021-03-10 LAB — CBC WITH DIFFERENTIAL/PLATELET
Absolute Monocytes: 536 cells/uL (ref 200–950)
Basophils Absolute: 19 cells/uL (ref 0–200)
Basophils Relative: 0.3 %
Eosinophils Absolute: 132 cells/uL (ref 15–500)
Eosinophils Relative: 2.1 %
HCT: 42.8 % (ref 38.5–50.0)
Hemoglobin: 14.5 g/dL (ref 13.2–17.1)
Lymphs Abs: 1096 cells/uL (ref 850–3900)
MCH: 32.8 pg (ref 27.0–33.0)
MCHC: 33.9 g/dL (ref 32.0–36.0)
MCV: 96.8 fL (ref 80.0–100.0)
MPV: 10.9 fL (ref 7.5–12.5)
Monocytes Relative: 8.5 %
Neutro Abs: 4517 cells/uL (ref 1500–7800)
Neutrophils Relative %: 71.7 %
Platelets: 193 10*3/uL (ref 140–400)
RBC: 4.42 10*6/uL (ref 4.20–5.80)
RDW: 12.6 % (ref 11.0–15.0)
Total Lymphocyte: 17.4 %
WBC: 6.3 10*3/uL (ref 3.8–10.8)

## 2021-03-10 LAB — LIPID PANEL
Cholesterol: 173 mg/dL (ref <200)
HDL: 22 mg/dL — ABNORMAL LOW (ref >=40)
LDL Cholesterol (Calc): 108 mg/dL (calc) — ABNORMAL HIGH
Non-HDL Cholesterol (Calc): 151 mg/dL (calc) — ABNORMAL HIGH (ref <130)
Total CHOL/HDL Ratio: 7.9 (calc) — ABNORMAL HIGH (ref <5.0)
Triglycerides: 326 mg/dL — ABNORMAL HIGH (ref <150)

## 2021-03-10 LAB — HEMOGLOBIN A1C
Hgb A1c MFr Bld: 6.4 % of total Hgb — ABNORMAL HIGH (ref <5.7)
Mean Plasma Glucose: 137 mg/dL
eAG (mmol/L): 7.6 mmol/L

## 2021-03-10 LAB — MAGNESIUM: Magnesium: 1.6 mg/dL (ref 1.5–2.5)

## 2021-03-10 LAB — TSH: TSH: 1.81 mIU/L (ref 0.40–4.50)

## 2021-03-19 DIAGNOSIS — Z7952 Long term (current) use of systemic steroids: Secondary | ICD-10-CM | POA: Diagnosis not present

## 2021-03-19 DIAGNOSIS — M3219 Other organ or system involvement in systemic lupus erythematosus: Secondary | ICD-10-CM | POA: Diagnosis not present

## 2021-03-19 DIAGNOSIS — M329 Systemic lupus erythematosus, unspecified: Secondary | ICD-10-CM | POA: Diagnosis not present

## 2021-04-13 ENCOUNTER — Ambulatory Visit (INDEPENDENT_AMBULATORY_CARE_PROVIDER_SITE_OTHER): Payer: BC Managed Care – PPO

## 2021-04-13 ENCOUNTER — Other Ambulatory Visit: Payer: Self-pay

## 2021-04-13 DIAGNOSIS — R748 Abnormal levels of other serum enzymes: Secondary | ICD-10-CM | POA: Diagnosis not present

## 2021-04-13 LAB — HEPATIC FUNCTION PANEL
AG Ratio: 1.4 (calc) (ref 1.0–2.5)
ALT: 89 U/L — ABNORMAL HIGH (ref 9–46)
AST: 99 U/L — ABNORMAL HIGH (ref 10–35)
Albumin: 3.6 g/dL (ref 3.6–5.1)
Alkaline phosphatase (APISO): 59 U/L (ref 35–144)
Bilirubin, Direct: 0.2 mg/dL (ref 0.0–0.2)
Globulin: 2.6 g/dL (calc) (ref 1.9–3.7)
Indirect Bilirubin: 0.5 mg/dL (calc) (ref 0.2–1.2)
Total Bilirubin: 0.7 mg/dL (ref 0.2–1.2)
Total Protein: 6.2 g/dL (ref 6.1–8.1)

## 2021-04-13 NOTE — Progress Notes (Signed)
Patient presents for a nurse visit to recheck liver enzymes. He states no changes to his medications and he is drinking plenty of water.

## 2021-04-26 ENCOUNTER — Other Ambulatory Visit: Payer: Self-pay | Admitting: Adult Health

## 2021-05-27 ENCOUNTER — Other Ambulatory Visit: Payer: Self-pay | Admitting: Adult Health

## 2021-05-27 DIAGNOSIS — G63 Polyneuropathy in diseases classified elsewhere: Secondary | ICD-10-CM

## 2021-05-27 MED ORDER — GABAPENTIN 100 MG PO CAPS: ORAL_CAPSULE | ORAL | 0 refills | Status: DC

## 2021-05-27 MED ORDER — GABAPENTIN 300 MG PO CAPS: ORAL_CAPSULE | ORAL | 1 refills | Status: DC

## 2021-05-27 NOTE — Progress Notes (Signed)
Per patient message gabapentin 100 mg not helping neuropathy, wants to try higher dose. Advised try 200-300 mg with current caps TID PRN then report back with progress.

## 2021-05-31 ENCOUNTER — Other Ambulatory Visit: Payer: Self-pay | Admitting: Internal Medicine

## 2021-07-06 ENCOUNTER — Ambulatory Visit (HOSPITAL_BASED_OUTPATIENT_CLINIC_OR_DEPARTMENT_OTHER): Payer: BC Managed Care – PPO | Admitting: Internal Medicine

## 2021-07-12 DIAGNOSIS — H353131 Nonexudative age-related macular degeneration, bilateral, early dry stage: Secondary | ICD-10-CM | POA: Diagnosis not present

## 2021-07-12 DIAGNOSIS — H5213 Myopia, bilateral: Secondary | ICD-10-CM | POA: Diagnosis not present

## 2021-07-28 ENCOUNTER — Other Ambulatory Visit: Payer: Self-pay | Admitting: Internal Medicine

## 2021-07-28 DIAGNOSIS — L509 Urticaria, unspecified: Secondary | ICD-10-CM

## 2021-08-02 DIAGNOSIS — K7581 Nonalcoholic steatohepatitis (NASH): Secondary | ICD-10-CM | POA: Diagnosis not present

## 2021-08-02 DIAGNOSIS — Z7982 Long term (current) use of aspirin: Secondary | ICD-10-CM | POA: Diagnosis not present

## 2021-08-02 DIAGNOSIS — M329 Systemic lupus erythematosus, unspecified: Secondary | ICD-10-CM | POA: Diagnosis not present

## 2021-08-02 DIAGNOSIS — Z7952 Long term (current) use of systemic steroids: Secondary | ICD-10-CM | POA: Diagnosis not present

## 2021-08-02 DIAGNOSIS — Z79899 Other long term (current) drug therapy: Secondary | ICD-10-CM | POA: Diagnosis not present

## 2021-08-04 ENCOUNTER — Encounter: Payer: Self-pay | Admitting: Internal Medicine

## 2021-08-16 ENCOUNTER — Telehealth: Payer: Self-pay | Admitting: Nurse Practitioner

## 2021-08-16 NOTE — Telephone Encounter (Signed)
Patient states that he has active Lupus and his C4 levels are low. Is there anything he can take to increase his C4 level? -e welch ?

## 2021-08-17 DIAGNOSIS — Z5181 Encounter for therapeutic drug level monitoring: Secondary | ICD-10-CM | POA: Diagnosis not present

## 2021-08-17 DIAGNOSIS — R21 Rash and other nonspecific skin eruption: Secondary | ICD-10-CM | POA: Diagnosis not present

## 2021-08-17 DIAGNOSIS — M329 Systemic lupus erythematosus, unspecified: Secondary | ICD-10-CM | POA: Diagnosis not present

## 2021-08-17 DIAGNOSIS — Z7952 Long term (current) use of systemic steroids: Secondary | ICD-10-CM | POA: Diagnosis not present

## 2021-08-17 DIAGNOSIS — L409 Psoriasis, unspecified: Secondary | ICD-10-CM | POA: Diagnosis not present

## 2021-08-18 ENCOUNTER — Other Ambulatory Visit: Payer: Self-pay | Admitting: Nurse Practitioner

## 2021-08-18 ENCOUNTER — Other Ambulatory Visit: Payer: Self-pay | Admitting: Adult Health

## 2021-08-18 NOTE — Telephone Encounter (Signed)
Patient notified

## 2021-09-03 ENCOUNTER — Encounter: Payer: Self-pay | Admitting: Adult Health

## 2021-09-03 ENCOUNTER — Ambulatory Visit (INDEPENDENT_AMBULATORY_CARE_PROVIDER_SITE_OTHER): Payer: BC Managed Care – PPO | Admitting: Adult Health

## 2021-09-03 ENCOUNTER — Other Ambulatory Visit: Payer: Self-pay | Admitting: Adult Health

## 2021-09-03 VITALS — BP 118/78 | HR 96 | Temp 97.9°F | Resp 16 | Ht 72.0 in | Wt 232.0 lb

## 2021-09-03 DIAGNOSIS — Z13220 Encounter for screening for lipoid disorders: Secondary | ICD-10-CM | POA: Diagnosis not present

## 2021-09-03 DIAGNOSIS — E1169 Type 2 diabetes mellitus with other specified complication: Secondary | ICD-10-CM

## 2021-09-03 DIAGNOSIS — Z79899 Other long term (current) drug therapy: Secondary | ICD-10-CM

## 2021-09-03 DIAGNOSIS — Z136 Encounter for screening for cardiovascular disorders: Secondary | ICD-10-CM

## 2021-09-03 DIAGNOSIS — F419 Anxiety disorder, unspecified: Secondary | ICD-10-CM

## 2021-09-03 DIAGNOSIS — Z0001 Encounter for general adult medical examination with abnormal findings: Secondary | ICD-10-CM

## 2021-09-03 DIAGNOSIS — E559 Vitamin D deficiency, unspecified: Secondary | ICD-10-CM

## 2021-09-03 DIAGNOSIS — E66811 Obesity, class 1: Secondary | ICD-10-CM

## 2021-09-03 DIAGNOSIS — Z125 Encounter for screening for malignant neoplasm of prostate: Secondary | ICD-10-CM | POA: Diagnosis not present

## 2021-09-03 DIAGNOSIS — Z1389 Encounter for screening for other disorder: Secondary | ICD-10-CM

## 2021-09-03 DIAGNOSIS — Z0189 Encounter for other specified special examinations: Secondary | ICD-10-CM

## 2021-09-03 DIAGNOSIS — Z1329 Encounter for screening for other suspected endocrine disorder: Secondary | ICD-10-CM

## 2021-09-03 DIAGNOSIS — N401 Enlarged prostate with lower urinary tract symptoms: Secondary | ICD-10-CM

## 2021-09-03 DIAGNOSIS — E669 Obesity, unspecified: Secondary | ICD-10-CM

## 2021-09-03 DIAGNOSIS — I10 Essential (primary) hypertension: Secondary | ICD-10-CM

## 2021-09-03 DIAGNOSIS — R931 Abnormal findings on diagnostic imaging of heart and coronary circulation: Secondary | ICD-10-CM

## 2021-09-03 DIAGNOSIS — I7789 Other specified disorders of arteries and arterioles: Secondary | ICD-10-CM

## 2021-09-03 DIAGNOSIS — Z1211 Encounter for screening for malignant neoplasm of colon: Secondary | ICD-10-CM

## 2021-09-03 DIAGNOSIS — R350 Frequency of micturition: Secondary | ICD-10-CM | POA: Diagnosis not present

## 2021-09-03 DIAGNOSIS — Z131 Encounter for screening for diabetes mellitus: Secondary | ICD-10-CM | POA: Diagnosis not present

## 2021-09-03 DIAGNOSIS — Z0000 Encounter for general adult medical examination without abnormal findings: Secondary | ICD-10-CM

## 2021-09-03 DIAGNOSIS — M1000 Idiopathic gout, unspecified site: Secondary | ICD-10-CM

## 2021-09-03 DIAGNOSIS — E1122 Type 2 diabetes mellitus with diabetic chronic kidney disease: Secondary | ICD-10-CM

## 2021-09-03 DIAGNOSIS — E119 Type 2 diabetes mellitus without complications: Secondary | ICD-10-CM

## 2021-09-03 DIAGNOSIS — G63 Polyneuropathy in diseases classified elsewhere: Secondary | ICD-10-CM

## 2021-09-03 DIAGNOSIS — I77810 Thoracic aortic ectasia: Secondary | ICD-10-CM

## 2021-09-03 DIAGNOSIS — M3219 Other organ or system involvement in systemic lupus erythematosus: Secondary | ICD-10-CM

## 2021-09-03 DIAGNOSIS — I700 Atherosclerosis of aorta: Secondary | ICD-10-CM

## 2021-09-03 NOTE — Progress Notes (Signed)
Complete Physical ? ?Assessment and Plan: ? ?[name] was seen today for annual exam. ? ?Diagnoses and all orders for this visit: ? ?Encounter for Annual Physical Exam with abnormal findings ?Due annually  ?Health Maintenance reviewed ?Healthy lifestyle reviewed and goals set ? ?Aortic atherosclerosis ([place]) ?Control blood pressure, cholesterol, glucose, increase exercise.  ?-     Lipid panel ? ?Systemic lupus erythematosus with other organ involvement, unspecified SLE type ([name]) ?Follows with [facility] rheum ?Doesn't tolerate most abx - trigger flares; zpak is ok per patient ?-     CBC with Differential/Platelet ? ?Polyneuropathy associated with underlying disease ([place]) ?Check feet daily, secondary to lupus, declines podiatry referral  ? ?Morbid obesity, unspecified obesity type ([place]) ?Long discussion about weight loss, diet, and exercise ?Recommended diet heavy in fruits and veggies and low in animal meats, cheeses, and dairy products, appropriate calorie intake ?Discussed appropriate weight for height  ?Follow up at next visit ? ?CKD stage 2 due to type 2 diabetes mellitus ([name]) ?Increase fluids, avoid NSAIDS, monitor sugars, will monitor ?-     COMPLETE METABOLIC PANEL WITH GFR ?-     Microalbumin / creatinine urine ratio ?-     Urinalysis, Routine w reflex microscopic ? ?Hyperlipidemia associated with type 2 diabetes mellitus ([name]) ?Continue medications: zetia ?Intolerance of several meds, nexletol tolerated but insurance wouldn't cover ?Cardiology was investigating [id number], declines prescription through our office at this time, hx of poor/severe med reactions ?Continue low cholesterol diet and exercise.  ?Check lipid panel.  ?-     Lipid panel ?-     TSH ? ?Type 2 diabetes mellitus with stage 2 chronic kidney disease, without long-term current use of insulin ([name]) ?Declined metformin, working on lifestyle to try to control without meds per strong patient preference ?Education: Reviewed ?ABCs? of diabetes management  (respective goals in parentheses):  A1C (<7), blood pressure (<130/80), and cholesterol (LDL <70) ?Eye Exam yearly and Dental Exam every 6 months. - eye exam report requested ?Dietary recommendations ?Physical Activity recommendations ?- given foot care handout and explained the principles  ?-     COMPLETE METABOLIC PANEL WITH GFR ?-     Hemoglobin A1c ? ?NASH (nonalcoholic steatohepatitis) ?Weight loss advised, avoid alcohol/tylenol, will monitor LFTs ?-     COMPLETE METABOLIC PANEL WITH GFR ? 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 ?CAD/ [name] CCS 400+ ?Dr. [name] following, pending [id number] evaluation, has follow up planned ?Declines other med attempts ?Control blood pressure, cholesterol, glucose, increase exercise.  ?Denies chest pain ? ?Primary hypertension ?Continue medication ?Monitor blood pressure at home; call if consistently over 130/80 ?Continue DASH diet.   ?Reminder to go to the ER if any CP, SOB, nausea, dizziness, severe HA, changes vision/speech, left arm numbness and tingling and jaw pain. ?-     CBC with Differential/Platelet ?-     COMPLETE METABOLIC PANEL WITH GFR ?-     Magnesium ?-     TSH ?-     Microalbumin / creatinine urine ratio ?-     Urinalysis, Routine w reflex microscopic ?-     EKG 12-Lead ? ?Mild intermittent asthma without complication ?Monitor, avoid triggers, albuterol PRN ?-     CBC with Differential/Platelet ? ?Gastroesophageal reflux disease, unspecified whether esophagitis present ?Well managed on current medications ?Discussed diet, avoiding triggers and other lifestyle changes ?-     COMPLETE METABOLIC PANEL WITH GFR ?-     Magnesium ? ?Anxiety ?Well managed by  current regimen; benzo PRN rare use ?continue medications ?Stress management techniques discussed, increase water, good sleep hygiene discussed, increase exercise, and increase veggies.  ? ?Idiopathic gout, unspecified chronicity, unspecified site ?Continue allopurinol ?-     Uric  acid ? ?Vitamin D deficiency ?-     VITAMIN D 25 Hydroxy (Vit-D Deficiency, Fractures) ? ?Seasonal allergies ?Continue OTC antihistamine PRN ? ?Screening for prostate cancer ?-     PSA ? ?Bifascicular block ?Monitor; verified not changed from last year;  ?Est/follows with cardiology  ? ?Colon cancer screening ?= GI referral placed  ? ?Orders Placed This Encounter  ?Procedures  ? CBC with Differential/Platelet  ? COMPLETE METABOLIC PANEL WITH GFR  ? Magnesium  ? Lipid panel  ? TSH  ? Hemoglobin A1c  ? VITAMIN D 25 Hydroxy (Vit-D Deficiency, Fractures)  ? PSA  ? Microalbumin / creatinine urine ratio  ? Uric acid  ? Ambulatory referral to Gastroenterology  ? EKG 12-Lead  ? HM DIABETES FOOT EXAM  ? ? ?Discussed med's effects and SE's. Screening labs and tests as requested with regular follow-up as recommended. ?Over 40 minutes of exam, counseling, chart review and critical decision making was performed ? ?Future Appointments  ?Date Time Provider [place]  ?12/09/2021  9:30 AM Darrol Jump, NP [facility] None  ?03/16/2022  9:30 AM Darrol Jump, NP [facility] None  ?09/08/2022 10:00 AM [name], MD [facility] None  ? ? ?  ? ?HPI ?66 y.o. male patient presents for a complete physical. 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   ? ?He is single, no kids.  ? ?Patient was diagnosed with cutaneous Lupus in 2017 and is followed at [facility].  Aug 2018 patient had a severe intolerant or allergic reaction to Imuran. Since then his cutaneous Lupus symptoms seem reasonably controlled on Prednisone 5 mg daily. Reports hasn't had a flare in 4 years. He has some related peripheral neuropathic pain, reports well managed with gabapentin 100 mg PRN. He is on rare PRN xanax for anxiety exacerbated by prednisone, reports hasn't needed in several weeks.   ? ?BMI is Body mass index is 31.46 kg/m?., he has not been working on diet and exercise. He is down 22 lb in the last 4 months, attributes to lupus flare.  ?Wt Readings from Last 3 Encounters:  ?09/03/21 232 lb (105.2 kg)  ?04/13/21 254 lb 6.4 oz (115.4 kg)  ?03/09/21 250 lb (113.4 kg)  ? ?He had a coronary calcium score performed 10/28/2020 which was 2234 with disease spread Alprep in all 3 coronary arteries and was evaluated by Dr. [name]. Had myoview 02/05/2021 showing no previous MI/ischemia, however intermediate risk with reduced systolic function. EF 48%. CT from 10/2020 also showed 4.1 cm ascending aortic aneurysm recommended for 1 year follow up by CTA/MRA.  ? ?His blood pressure has been controlled at home, today their BP is BP: 118/78 ?He does workout. He denies chest pain, shortness of breath, dizziness.  ? ?He has aortic atherosclerosis  per Ct 01/2020 ? ?He is on cholesterol medication (cannot tolerate lipitor, zocor, rosuvastatin on zetia 10 mg daily, sent in nexlizet last visit but insurance declined, Dr. Debara Pickett was attempting repatha but patient states never started due to severe flare, declines at this time, has been taking RYRS, fasting today) and denies myalgias. Declines lipid clinic referral, receptive to CT coronary calcium. His cholesterol is not at goal. The cholesterol last visit was:   ?Lab Results  ?Component Value Date  ? CHOL 173 03/09/2021  ? HDL 22 (L) 03/09/2021  ? LDLCALC 108  (H) 03/09/2021  ? TRIG 326 (H) 03/09/2021  ? CHOLHDL 7.9 (H) 03/09/2021  ? ?He has been working on diet and exercise for T2 diabetes newly up 7+ in 2022, he is on bASA, he is on ACE/ARB and denies increased a

## 2021-09-04 ENCOUNTER — Other Ambulatory Visit: Payer: Self-pay | Admitting: Adult Health

## 2021-09-04 LAB — CBC WITH DIFFERENTIAL/PLATELET
Absolute Monocytes: 610 cells/uL (ref 200–950)
Basophils Absolute: 22 cells/uL (ref 0–200)
Basophils Relative: 0.4 %
Eosinophils Absolute: 151 cells/uL (ref 15–500)
Eosinophils Relative: 2.7 %
HCT: 41.8 % (ref 38.5–50.0)
Hemoglobin: 14 g/dL (ref 13.2–17.1)
Lymphs Abs: 1254 cells/uL (ref 850–3900)
MCH: 32 pg (ref 27.0–33.0)
MCHC: 33.5 g/dL (ref 32.0–36.0)
MCV: 95.7 fL (ref 80.0–100.0)
MPV: 10.8 fL (ref 7.5–12.5)
Monocytes Relative: 10.9 %
Neutro Abs: 3562 cells/uL (ref 1500–7800)
Neutrophils Relative %: 63.6 %
Platelets: 209 10*3/uL (ref 140–400)
RBC: 4.37 10*6/uL (ref 4.20–5.80)
RDW: 13.1 % (ref 11.0–15.0)
Total Lymphocyte: 22.4 %
WBC: 5.6 10*3/uL (ref 3.8–10.8)

## 2021-09-04 LAB — COMPLETE METABOLIC PANEL WITH GFR
AG Ratio: 1.2 (calc) (ref 1.0–2.5)
ALT: 93 U/L — ABNORMAL HIGH (ref 9–46)
AST: 115 U/L — ABNORMAL HIGH (ref 10–35)
Albumin: 3.8 g/dL (ref 3.6–5.1)
Alkaline phosphatase (APISO): 63 U/L (ref 35–144)
BUN: 16 mg/dL (ref 7–25)
CO2: 26 mmol/L (ref 20–32)
Calcium: 8.9 mg/dL (ref 8.6–10.3)
Chloride: 102 mmol/L (ref 98–110)
Creat: 0.95 mg/dL (ref 0.70–1.35)
Globulin: 3.1 g/dL (calc) (ref 1.9–3.7)
Glucose, Bld: 100 mg/dL — ABNORMAL HIGH (ref 65–99)
Potassium: 4.3 mmol/L (ref 3.5–5.3)
Sodium: 137 mmol/L (ref 135–146)
Total Bilirubin: 0.9 mg/dL (ref 0.2–1.2)
Total Protein: 6.9 g/dL (ref 6.1–8.1)
eGFR: 89 mL/min/{1.73_m2} (ref >=60)

## 2021-09-04 LAB — VITAMIN D 25 HYDROXY (VIT D DEFICIENCY, FRACTURES): Vit D, 25-Hydroxy: 109 ng/mL — ABNORMAL HIGH (ref 30–100)

## 2021-09-04 LAB — HEMOGLOBIN A1C
Hgb A1c MFr Bld: 6.2 % of total Hgb — ABNORMAL HIGH (ref <5.7)
Mean Plasma Glucose: 131 mg/dL
eAG (mmol/L): 7.3 mmol/L

## 2021-09-04 LAB — LIPID PANEL
Cholesterol: 173 mg/dL (ref <200)
HDL: 27 mg/dL — ABNORMAL LOW (ref >=40)
LDL Cholesterol (Calc): 123 mg/dL (calc) — ABNORMAL HIGH
Non-HDL Cholesterol (Calc): 146 mg/dL (calc) — ABNORMAL HIGH (ref <130)
Total CHOL/HDL Ratio: 6.4 (calc) — ABNORMAL HIGH (ref <5.0)
Triglycerides: 120 mg/dL (ref <150)

## 2021-09-04 LAB — PSA: PSA: 0.3 ng/mL (ref <=4.00)

## 2021-09-04 LAB — URIC ACID: Uric Acid, Serum: 6.1 mg/dL (ref 4.0–8.0)

## 2021-09-04 LAB — MICROALBUMIN / CREATININE URINE RATIO
Creatinine, Urine: 401 mg/dL — ABNORMAL HIGH (ref 20–320)
Microalb Creat Ratio: 6 mcg/mg creat (ref <30)
Microalb, Ur: 2.3 mg/dL

## 2021-09-04 LAB — MAGNESIUM: Magnesium: 1.6 mg/dL (ref 1.5–2.5)

## 2021-09-04 LAB — TSH: TSH: 1.56 mIU/L (ref 0.40–4.50)

## 2021-09-04 MED ORDER — VITAMIN D3 125 MCG (5000 UT) PO CAPS: 5000.0000 [IU] | ORAL_CAPSULE | Freq: Every day | ORAL | Status: AC
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
## 2021-12-01 ENCOUNTER — Other Ambulatory Visit: Payer: Self-pay | Admitting: Nurse Practitioner

## 2021-12-09 ENCOUNTER — Ambulatory Visit (INDEPENDENT_AMBULATORY_CARE_PROVIDER_SITE_OTHER): Payer: BC Managed Care – PPO | Admitting: Nurse Practitioner

## 2021-12-09 ENCOUNTER — Encounter: Payer: Self-pay | Admitting: Nurse Practitioner

## 2021-12-09 VITALS — BP 128/88 | HR 75 | Temp 97.1°F | Ht 70.5 in | Wt 230.8 lb

## 2021-12-09 DIAGNOSIS — K7581 Nonalcoholic steatohepatitis (NASH): Secondary | ICD-10-CM

## 2021-12-09 DIAGNOSIS — I77810 Thoracic aortic ectasia: Secondary | ICD-10-CM | POA: Diagnosis not present

## 2021-12-09 DIAGNOSIS — E1169 Type 2 diabetes mellitus with other specified complication: Secondary | ICD-10-CM

## 2021-12-09 DIAGNOSIS — M3219 Other organ or system involvement in systemic lupus erythematosus: Secondary | ICD-10-CM

## 2021-12-09 DIAGNOSIS — J4520 Mild intermittent asthma, uncomplicated: Secondary | ICD-10-CM

## 2021-12-09 DIAGNOSIS — E1122 Type 2 diabetes mellitus with diabetic chronic kidney disease: Secondary | ICD-10-CM | POA: Diagnosis not present

## 2021-12-09 DIAGNOSIS — F419 Anxiety disorder, unspecified: Secondary | ICD-10-CM

## 2021-12-09 DIAGNOSIS — I10 Essential (primary) hypertension: Secondary | ICD-10-CM

## 2021-12-09 DIAGNOSIS — N182 Chronic kidney disease, stage 2 (mild): Secondary | ICD-10-CM

## 2021-12-09 DIAGNOSIS — E785 Hyperlipidemia, unspecified: Secondary | ICD-10-CM

## 2021-12-09 DIAGNOSIS — R931 Abnormal findings on diagnostic imaging of heart and coronary circulation: Secondary | ICD-10-CM | POA: Diagnosis not present

## 2021-12-09 DIAGNOSIS — I700 Atherosclerosis of aorta: Secondary | ICD-10-CM | POA: Diagnosis not present

## 2021-12-09 DIAGNOSIS — E559 Vitamin D deficiency, unspecified: Secondary | ICD-10-CM

## 2021-12-09 DIAGNOSIS — Z1211 Encounter for screening for malignant neoplasm of colon: Secondary | ICD-10-CM

## 2021-12-09 DIAGNOSIS — Z79899 Other long term (current) drug therapy: Secondary | ICD-10-CM

## 2021-12-09 DIAGNOSIS — G63 Polyneuropathy in diseases classified elsewhere: Secondary | ICD-10-CM

## 2021-12-09 DIAGNOSIS — M1000 Idiopathic gout, unspecified site: Secondary | ICD-10-CM

## 2021-12-09 DIAGNOSIS — K219 Gastro-esophageal reflux disease without esophagitis: Secondary | ICD-10-CM

## 2021-12-09 DIAGNOSIS — E669 Obesity, unspecified: Secondary | ICD-10-CM

## 2021-12-09 NOTE — Patient Instructions (Signed)
Colorectal Cancer  Colorectal cancer is a cancerous (malignant) tumor in the colon or rectum, which are parts of the large intestine. A tumor is a mass of cells or tissue. The cancer can spread (metastasize) to other parts of the body. What are the causes? This condition is usually caused by abnormal growths called polyps on the inner wall of the colon or rectum. Left untreated, these polyps can develop into cancer. Other times, abnormal changes to genes (gene mutations) can cause cells to become cancerous. What increases the risk? The following factors may make you more likely to develop this condition: Being older than age 67. Having a personal or family history of colorectal cancer or polyps in your colon. Having diabetes, or having had cancer and cancer treatments such as radiation before. Having certain hereditary conditions, such as: Lynch syndrome. Familial adenomatous polyposis. Turcot syndrome. Peutz-Jeghers syndrome. MUTYH-associated polyposis (MAP). Being overweight or obese. Having a diet that is: High in red meats, such as beef, pork, lamb, or liver. High in precooked, cured, or other processed meat, such as sausages, meat loaves, and hot dogs. Low in fiber, such as fiber found in whole grains, fruits, and vegetables. Being inactive (sedentary), smoking, or drinking too much alcohol. Having an inflammatory bowel disease, such as ulcerative colitis or Crohn's disease. What are the signs or symptoms? Early colorectal cancer often does not cause symptoms. As the cancer grows, symptoms may include: Changes in bowel habits. Feeling like the bowel does not empty completely after a bowel movement. Stools (feces) that are narrower than usual, or blood in the stool or toilet after a bowel movement. The blood may be bright red or very dark in color. Diarrhea, constipation, or frequent gas pain. Anemia, constant tiredness (fatigue), or nausea and vomiting. Discomfort, pain, bloating,  fullness, or cramps in the abdomen. Unexplained weight loss. How is this diagnosed? This condition may be diagnosed with: A medical history. A physical exam. Tests. These may include: An exam of the rectum using a gloved finger (digital rectal exam). A stool test called a fecal occult blood test. Blood tests. A biopsy. This is removal of a tissue sample from the colon or rectum to be looked at under a microscope. You may also have other tests, including: X-rays, CT scans, MRIs, or a PET scan. A sigmoidoscopy. This test is done to view the inside of the rectum. A colonoscopy. This test is done to view the inside of the colon. During this test, small polyps can be removed or biopsies may be taken. An endorectal ultrasound. This test checks how deep a tumor in the rectum has grown and whether the cancer has spread to lymph nodes or other nearby tissues. Additional tests may be done to find out whether the cancer has spread to other parts of the body (what stage it is). The stages of cancer include: Stage 0 - At this stage, the cancer is found only in the innermost lining of the colon or rectum. The tumor has not spread to other tissue. Stage 1 (I) - At this stage, the cancer has grown into the inner wall (muscle layer) of the colon or rectum. Stage 2 (II) - At this stage, the cancer has grown more deeply into the wall of the colon or rectum or through the wall. It may have invaded nearby tissue or organs. Stage 3 (III) - At this stage, the cancer has spread to nearby lymph nodes or tissue near the lymph nodes. Stage 4 (IV) - At this  stage, the cancer has spread to other parts of the body that are not near the colon, such as the liver or lungs. How is this treated? Treatment for this condition depends on the type and stage of the cancer. Treatment may include: Surgery. In the early stages of the cancer, surgery may be done to remove polyps or small tumors from the colon. In later stages, surgery  may be done to remove part of the colon (partial colectomy). Chemotherapy. This treatment uses medicines to kill cancer cells. Targeted therapy. This treatment can kill tumor cells by targeting specific gene mutations or proteins that the cancer expresses. Immunotherapy (biologic therapy). This treatment uses your body's disease-fighting system (immune system) to fight the cancer. Substances made by your body or in a laboratory are used to boost, direct, or restore your body's natural defenses against cancer. Radiation therapy. This treatment uses radiation to kill cancer cells or shrink tumors. Radiofrequency ablation. This treatment uses radio waves to destroy the tumors that may have spread to other areas of the body, such as the liver. Follow these instructions at home: Take over-the-counter and prescription medicines only as told by your health care provider. Try to eat regular, healthy meals. Some of your treatments might affect your appetite. Ask to meet with a dietitian if you are having problems eating or with your appetite. Consider joining a support group. This may help you learn about your diagnosis and manage the stress of having colorectal cancer. If you are admitted to the hospital, tell your cancer care team. Keep all follow-up visits. This is important. How is this prevented? Colorectal cancer can be prevented with screening tests that find polyps so they can be removed before they develop into cancer. All adults should have screening for colorectal cancer starting at age 97 and continuing until age 37. Your health care provider may recommend screening before age 61. People at increased risk should start screening at an earlier age. You may be able to help reduce your risk of developing colorectal cancer by staying at a healthy weight, eating a healthy diet, avoiding tobacco and alcohol use, and being physically active. Where to find more information American Cancer Society:  cancer.[place] ([place]): cancer.gov Contact a health care provider if: Your diarrhea or constipation does not go away. You have blood in your stool or in the toilet after a bowel movement. Your bowel habits change. You have increased pain in your abdomen. You notice new fatigue or weakness. You lose weight without a known reason. Get help right away if: You have increased bleeding from the rectum. You have any uncontrollable or severe abdominal symptoms. Summary Colorectal cancer is a cancerous (malignant) tumor in the colon or rectum, which are parts of the large intestine. Common risk factors for this condition include being older than age 69, having a personal or family history of colorectal cancer or colon polyps, having certain hereditary conditions, or having conditions such as diabetes or inflammatory bowel disease. This condition may be diagnosed with tests, such as a colonoscopy and biopsy. Treatment depends on the type and stage of the cancer. Often, treatment includes surgery to remove the abnormal tissue, along with chemotherapy, targeted therapy, or immunotherapy. Keep all follow-up visits. This is important. This information is not intended to replace advice given to you by your health care provider. Make sure you discuss any questions you have with your health care provider. Document Revised: 08/14/2019 Document Reviewed: 08/14/2019 Elsevier Patient Education  [place].

## 2021-12-09 NOTE — Progress Notes (Signed)
3 MONTH FOLLOW UP  Assessment and Plan:  Aortic atherosclerosis (Sean Maldonado)- CT 01/2020 Discussed lifestyle modifications. Recommended diet heavy in fruits and veggies, omega 3's. Decrease consumption of animal meats, cheeses, and dairy products. Remain active and exercise as tolerated. Continue to monitor. Check lipids  Ascending aorta dilation ([name]) - CT 10/2020 Control BP, follow up 1 year  CAD/ [name] CCS 400+ Dr. [name] following, pending [id number] evaluation Declines other med attempts Control blood pressure, cholesterol, glucose, increase exercise.  Denies chest pain  Systemic lupus erythematosus with other organ involvement, unspecified SLE type (Sean Maldonado) Follows with [facility] rheum, prednisone Doesn't tolerate most abx - trigger flares; zpak is ok per patient Check CBC with Differential/Platelet  Polyneuropathy associated with lupus (Sean Maldonado) Check feet daily, secondary to lupus, declines podiatry referral   Obesity - BMI 33 Discussed appropriate BMI Goal of losing 1 lb per month. Diet modification. Physical activity. Encouraged/praised to build confidence.   CKD stage 2 due to type 2 diabetes mellitus (Sean Maldonado) Discussed how what you eat and drink can aide in kidney protection. Stay well hydrated. Avoid high salt foods. Avoid NSAIDS. Keep BP and BG well controlled.   Take medications as prescribed. Remain active and exercise as tolerated daily. Maintain weight.  Continue to monitor. Check CMP/GFR  Hyperlipidemia associated with type 2 diabetes mellitus (Sean Maldonado) Discussed lifestyle modifications. Recommended diet heavy in fruits and veggies, omega 3's. Decrease consumption of animal meats, cheeses, and dairy products. Remain active and exercise as tolerated. Continue to monitor. Check lipids   Type 2 diabetes mellitus with stage 2 chronic kidney disease, without long-term current use of insulin Sean Maldonado) Education: Reviewed 'ABCs' of diabetes management  A1C (<7) Blood pressure  (<130/80) Cholesterol (LDL <70) Continue Eye Exam yearly  Continue Dental Exam Q6 mo Discussed dietary recommendations Discussed Physical Activity recommendations Foot exam UTD Check A1C   NASH (nonalcoholic steatohepatitis) Weight loss advised, avoid alcohol/tylenol, will monitor LFTs   Primary hypertension Controlled  Continue medications;  Discussed DASH (Dietary Approaches to Stop Hypertension) DASH diet is lower in sodium than a typical American diet. Cut back on foods that are high in saturated fat, cholesterol, and trans fats. Eat more whole-grain foods, fish, poultry, and nuts Remain active and exercise as tolerated daily.  Monitor BP at home-Call if greater than 130/80.  Monitor CBC.CMP   Mild intermittent asthma without complication Monitor, avoid triggers, albuterol PRN   Gastroesophageal reflux disease, unspecified whether esophagitis present No suspected reflux complications (Sean Maldonado). Lifestyle modification:  wt loss, avoid meals 2-3h before bedtime. Consider eliminating food triggers:  chocolate, caffeine, EtOH, acid/spicy food.   Anxiety Well managed by current regimen; benzo PRN rare use continue medications Stress management techniques discussed, increase water, good sleep hygiene discussed, increase exercise, and increase veggies.   Idiopathic gout, unspecified chronicity, unspecified site Continue allopurinol Avoid triggers. Discussed low purine diet.  Vitamin D deficiency Continue supplement  Monitor Vitamin D levels  Screening for colon cancer Cologuard  Medication management All medications discussed and reviewed in full. All questions and concerns regarding medications addressed.    Orders Placed This Encounter  Procedures   CBC with Differential/Platelet   COMPLETE METABOLIC PANEL WITH GFR   Lipid panel   Hemoglobin A1c   Cologuard    Discussed med's effects and SE's. Labs and tests as requested with regular follow-up  as recommended. Over 30 minutes of exam, counseling, chart review and critical decision making was performed  Future Appointments  Date Time Provider Sean Maldonado  Sean Maldonado 10:30 AM  Sean Maldonado, Sean Maldonado  Sean Maldonado 10:00 AM Sean Maldonado, Sean Maldonado      HPI 66 y.o. male patient presents for 3 month follow up for morbid obesity, T2DM, hld, htn, gout, vitamin D.    Overall he reports feeling well.    He has noticed an increase tingling sensation between his shoulder blades.  He feels this is due to the way he sleeps.  He has not treated.    Patient was diagnosed with cutaneous Lupus in 2017 and is followed at [facility].  Aug 2018 patient had a severe intolerant or allergic reaction to Imuran. Since then his cutaneous Lupus symptoms typically reasonably controlled on Prednisone 5 mg daily, recently increased dose due to flare after taper attempt. He has some related peripheral neuropathic pain, reports well managed with gabapentin 100 mg PRN. He is on rare PRN xanax for anxiety exacerbated by prednisone.     BMI is Body mass index is 32.65 kg/m., he has been working on diet and exercise. Wt Readings from Last 3 Encounters:  12/09/21 230 lb 12.8 oz (104.7 kg)  09/03/21 232 lb (105.2 kg)  04/13/21 254 lb 6.4 oz (115.4 kg)   He has aortic atherosclerosis per Ct 01/2020.  He had a coronary calcium score performed 10/28/2020 which was 2234 with disease spread Alprep in all 3 coronary arteries and was evaluated by Dr. [name]. Had myoview 02/05/2021 showing no previous MI/ischemia, however intermediate risk with reduced systolic function. EF 48%.   His blood pressure has been controlled at home, today their BP is BP: 128/88 He does workout. He denies chest pain, shortness of breath, dizziness.   He is on cholesterol medication (statin intolerant; on zetia 10 mg daily, on RYRS, nexlizet declined by insurance; pending evaluation by Dr. Debara Pickett for [phone]) and denies  myalgias.  His cholesterol is not at goal. The cholesterol last visit was:   Lab Results  Component Value Date   CHOL 173 09/03/2021   HDL 27 (L) 09/03/2021   LDLCALC 123 (H) 09/03/2021   TRIG 120 09/03/2021   CHOLHDL 6.4 (H) 09/03/2021   He has been working on diet and exercise for T2 diabetes (A1C of 8% on 06/11/2020) now controlled by lifestyle, he is on bASA, he is on ACE/ARB and denies increased appetite, nausea, polydipsia, polyuria and visual disturbances. He has neuropathy attributed to lupus, predates T2DM diagnosis. He is prescribed metformin, diarrhea with low dose and not taking, declines. Last A1C in the office was:  Lab Results  Component Value Date   HGBA1C 6.2 (H) 09/03/2021   CKD 2 on  Last GFR: Lab Results  Component Value Date   GFRNONAA 71 09/03/2020    Patient is on Vitamin D supplement, taking 10000 IU daily.    Lab Results  Component Value Date   VD25OH 109 (H) 09/03/2021     Patient is on allopurinol for gout and does not report a recent flare.  Lab Results  Component Value Date   LABURIC 6.1 09/03/2021     Current Medications:  Current Outpatient Medications on File Prior to Visit  Medication Sig Dispense Refill   allopurinol (ZYLOPRIM) 300 MG tablet TAKE 1 TABLET DAILY TO PREVENT GOUT. 90 tablet 3   ALPRAZolam (XANAX) 1 MG tablet Take 1/2 - 1 tablet 2 - 3 x /day ONLY if needed for Anxiety Attack & limit to 5 days /week to avoid Addiction & Dementia 90 tablet 0   aspirin EC 81 MG tablet Take  81 mg by mouth daily.      atenolol (TENORMIN) 100 MG tablet TAKE ONE TABLET BY MOUTH DAILY FOR FOR BLOOD PRESSURE 90 tablet 3   Cholecalciferol (VITAMIN D3) 125 MCG (5000 UT) CAPS Take 1 capsule (5,000 Units total) by mouth daily. 30 capsule    ezetimibe (ZETIA) 10 MG tablet Take 1 tablet Daily for Cholesterol 90 tablet 1   gabapentin (NEURONTIN) 300 MG capsule Take 1 caps three times a day as needed for neuropathy pain. 270 capsule 1   hydrOXYzine  (ATARAX/VISTARIL) 25 MG tablet TAKE 2 TABLETS THREE TIMES DAILY AS NEEDED FOR ITCHING. 60 tablet 0   lisinopril-hydrochlorothiazide (ZESTORETIC) 20-25 MG tablet Take 1 tablet Daily for Blod Pressure & Fluid Retention /Ankle Swelling 90 tablet 1   Magnesium 400 MG TABS Take 1 tablet by mouth daily.     MILK THISTLE PO Take 1 tablet by mouth daily.     Multiple Vitamin (MULTIVITAMIN) capsule Take 1 capsule by mouth daily.     Omega-3 Fatty Acids (FISH OIL PO) Take 1 capsule by mouth daily.     predniSONE (DELTASONE) 5 MG tablet Take 1-2 tabs daily as directed by doctor for lupus. 180 tablet 1   Probiotic Product (PROBIOTIC DAILY) CAPS Take 1 capsule by mouth daily.     Red Yeast Rice Extract (RED YEAST RICE PO) Take 1,200 mg by mouth daily.     triamcinolone cream (KENALOG) 0.1 % Apply 1 application topically 3 (three) times daily. (Patient taking differently: Apply 1 application  topically as needed.) 30 g 0   TURMERIC PO Take 2,000 mg by mouth daily.      zinc gluconate 50 MG tablet Take 50 mg by mouth daily.     No current facility-administered medications on file prior to visit.   Allergies:  Allergies  Allergen Reactions   Doxycycline     Lupus flair   Imuran [Azathioprine] Rash   Lipitor [Atorvastatin]     Myalgias    Penicillins     Causes lupus flare ups   Rosuvastatin Diarrhea   Zocor [Simvastatin]     Myalgias    Medical History:  has GERD (gastroesophageal reflux disease); NASH (nonalcoholic steatohepatitis); Asthma; Seasonal allergies; Hyperlipidemia associated with type 2 diabetes mellitus ([place]); Hypertension; Diet-controlled type 2 diabetes mellitus ([place]); Vitamin D deficiency; Medication management; Gout; Lupus (systemic lupus erythematosus) ([place]); 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 Surgical History:  He  has a past surgical history that includes Tonsillectomy; Wisdom tooth extraction; Axillary lymph node biopsy (Right, 08/27/2012); Cholecystectomy (N/A, 12/28/2012); and ORIF wrist fracture (Left, 01/17/2020). Family History:  His family history includes Alcoholism in his father; Diabetes in his mother; Heart disease in his mother. Social History:   reports that he has never smoked. He has never used smokeless tobacco. He reports current alcohol use. He reports that he does not use drugs.  Review of Systems:  Review of Systems  Constitutional:  Negative for malaise/fatigue and weight loss.  HENT:  Negative for hearing loss and tinnitus.   Eyes:  Negative for blurred vision and double vision.  Respiratory:  Negative for cough, shortness of breath and wheezing.   Cardiovascular:  Negative for chest  pain, palpitations, orthopnea, claudication and leg swelling.  Gastrointestinal:  Negative for abdominal pain, blood in stool, constipation, diarrhea, heartburn, melena, nausea and vomiting.  Genitourinary: Negative.   Musculoskeletal:  Negative for joint pain and myalgias.  Skin:  Positive for rash (lupus).  Neurological:  Positive for tingling (chronic intermittent due to lupus). Negative for dizziness, sensory change, weakness and headaches.  Endo/Heme/Allergies:  Negative for polydipsia.  Psychiatric/Behavioral: Negative.    All other systems reviewed and are negative.   Physical Exam: Estimated body mass index is 32.65 kg/m as calculated from the following:   Height as of this encounter: 5' 10.5" (1.791 m).   Weight as of this encounter: 230 lb 12.8 oz (104.7 kg). BP 128/88   Pulse 75   Temp (!) 97.1 F (36.2 C)   Ht 5' 10.5" (1.791 m)   Wt 230 lb 12.8 oz (104.7 kg)   SpO2 98%   BMI 32.65  kg/m  General Appearance: Well nourished, in no apparent distress.  Eyes: PERRLA, EOMs, conjunctiva no swelling or erythema  Sinuses: No Frontal/maxillary tenderness  ENT/Mouth: Ext aud canals clear, normal light reflex with TMs without erythema, bulging. Good dentition. No erythema, swelling, or exudate on post pharynx. Tonsils not swollen or erythematous. Hearing normal.  Neck: Supple, thyroid normal. No bruits  Respiratory: Respiratory effort normal, BS equal bilaterally without rales, rhonchi, wheezing or stridor.  Cardio: RRR without murmurs, rubs or gallops. Brisk peripheral pulses without edema.  Chest: symmetric, with normal excursions and percussion.  Abdomen: Soft, nontender, no guarding, rebound, hernias, masses, or organomegaly.  Lymphatics: Non tender without lymphadenopathy.  Genitourinary: declines Musculoskeletal: Full ROM all peripheral extremities,5/5 strength, and normal gait.  Skin: Warm, dry; Scattered erythematous to velvety pink to violaceous circumscribed lesions occasionally excoriated and some mild plaque-like over the scalp, trunk & extremities.  Neuro: Cranial nerves intact, reflexes equal bilaterally. Normal muscle tone, no cerebellar symptoms. Sensation intact.  Psych: Awake and oriented X 3, normal affect, Insight and Judgment appropriate.   Sean Jump, Sean 2:02 PM [facility]

## 2021-12-10 ENCOUNTER — Telehealth: Payer: Self-pay | Admitting: Nurse Practitioner

## 2021-12-10 LAB — COMPLETE METABOLIC PANEL WITH GFR
AG Ratio: 1.4 (calc) (ref 1.0–2.5)
ALT: 73 U/L — ABNORMAL HIGH (ref 9–46)
AST: 80 U/L — ABNORMAL HIGH (ref 10–35)
Albumin: 4.1 g/dL (ref 3.6–5.1)
Alkaline phosphatase (APISO): 64 U/L (ref 35–144)
BUN: 13 mg/dL (ref 7–25)
CO2: 27 mmol/L (ref 20–32)
Calcium: 9.1 mg/dL (ref 8.6–10.3)
Chloride: 101 mmol/L (ref 98–110)
Creat: 0.93 mg/dL (ref 0.70–1.35)
Globulin: 3 g/dL (calc) (ref 1.9–3.7)
Glucose, Bld: 135 mg/dL — ABNORMAL HIGH (ref 65–99)
Potassium: 4.6 mmol/L (ref 3.5–5.3)
Sodium: 137 mmol/L (ref 135–146)
Total Bilirubin: 0.7 mg/dL (ref 0.2–1.2)
Total Protein: 7.1 g/dL (ref 6.1–8.1)
eGFR: 92 mL/min/{1.73_m2} (ref >=60)

## 2021-12-10 LAB — CBC WITH DIFFERENTIAL/PLATELET
Absolute Monocytes: 670 cells/uL (ref 200–950)
Basophils Absolute: 20 cells/uL (ref 0–200)
Basophils Relative: 0.3 %
Eosinophils Absolute: 201 cells/uL (ref 15–500)
Eosinophils Relative: 3 %
HCT: 40.9 % (ref 38.5–50.0)
Hemoglobin: 14 g/dL (ref 13.2–17.1)
Lymphs Abs: 1059 cells/uL (ref 850–3900)
MCH: 33 pg (ref 27.0–33.0)
MCHC: 34.2 g/dL (ref 32.0–36.0)
MCV: 96.5 fL (ref 80.0–100.0)
MPV: 10.7 fL (ref 7.5–12.5)
Monocytes Relative: 10 %
Neutro Abs: 4750 cells/uL (ref 1500–7800)
Neutrophils Relative %: 70.9 %
Platelets: 201 10*3/uL (ref 140–400)
RBC: 4.24 10*6/uL (ref 4.20–5.80)
RDW: 12.5 % (ref 11.0–15.0)
Total Lymphocyte: 15.8 %
WBC: 6.7 10*3/uL (ref 3.8–10.8)

## 2021-12-10 LAB — HEMOGLOBIN A1C
Hgb A1c MFr Bld: 6 % of total Hgb — ABNORMAL HIGH (ref <5.7)
Mean Plasma Glucose: 126 mg/dL
eAG (mmol/L): 7 mmol/L

## 2021-12-10 LAB — LIPID PANEL
Cholesterol: 175 mg/dL (ref <200)
HDL: 27 mg/dL — ABNORMAL LOW (ref >=40)
LDL Cholesterol (Calc): 118 mg/dL (calc) — ABNORMAL HIGH
Non-HDL Cholesterol (Calc): 148 mg/dL (calc) — ABNORMAL HIGH (ref <130)
Total CHOL/HDL Ratio: 6.5 (calc) — ABNORMAL HIGH (ref <5.0)
Triglycerides: 178 mg/dL — ABNORMAL HIGH (ref <150)

## 2021-12-10 NOTE — Telephone Encounter (Signed)
A medication called voltaren cream was supposed to be sent in yesterday to put on his neck. Can one of the providers please send this in for him?

## 2021-12-13 ENCOUNTER — Other Ambulatory Visit: Payer: Self-pay | Admitting: Nurse Practitioner

## 2021-12-13 MED ORDER — DICLOFENAC SODIUM 1 % EX GEL: 4.0000 g | Freq: Four times a day (QID) | CUTANEOUS | 2 refills | Status: DC

## 2021-12-21 DIAGNOSIS — Z1211 Encounter for screening for malignant neoplasm of colon: Secondary | ICD-10-CM | POA: Diagnosis not present

## 2021-12-30 LAB — COLOGUARD: COLOGUARD: NEGATIVE

## 2022-01-03 ENCOUNTER — Telehealth: Payer: Self-pay | Admitting: Nurse Practitioner

## 2022-01-03 NOTE — Progress Notes (Signed)
PATIENT IS AWARE OF COLOGARD RESULTS. Sean Maldonado [facility]

## 2022-01-03 NOTE — Telephone Encounter (Signed)
Pt is wanting cologuard results and has questions about CT Scan

## 2022-01-03 NOTE — Telephone Encounter (Signed)
LM for patient to call back.

## 2022-01-04 NOTE — Telephone Encounter (Signed)
Patient aware.

## 2022-03-16 ENCOUNTER — Encounter: Payer: Self-pay | Admitting: Nurse Practitioner

## 2022-03-16 ENCOUNTER — Ambulatory Visit (INDEPENDENT_AMBULATORY_CARE_PROVIDER_SITE_OTHER): Payer: Medicare Other | Admitting: Nurse Practitioner

## 2022-03-16 ENCOUNTER — Ambulatory Visit: Payer: Medicare Other | Admitting: Adult Health

## 2022-03-16 VITALS — BP 110/62 | HR 70 | Temp 97.7°F | Ht 70.5 in | Wt 236.6 lb

## 2022-03-16 DIAGNOSIS — E559 Vitamin D deficiency, unspecified: Secondary | ICD-10-CM

## 2022-03-16 DIAGNOSIS — Z0001 Encounter for general adult medical examination with abnormal findings: Secondary | ICD-10-CM

## 2022-03-16 DIAGNOSIS — M1000 Idiopathic gout, unspecified site: Secondary | ICD-10-CM

## 2022-03-16 DIAGNOSIS — R6889 Other general symptoms and signs: Secondary | ICD-10-CM

## 2022-03-16 DIAGNOSIS — Z0000 Encounter for general adult medical examination without abnormal findings: Secondary | ICD-10-CM

## 2022-03-16 DIAGNOSIS — I77810 Thoracic aortic ectasia: Secondary | ICD-10-CM

## 2022-03-16 DIAGNOSIS — M3219 Other organ or system involvement in systemic lupus erythematosus: Secondary | ICD-10-CM | POA: Diagnosis not present

## 2022-03-16 DIAGNOSIS — I700 Atherosclerosis of aorta: Secondary | ICD-10-CM

## 2022-03-16 DIAGNOSIS — Z79899 Other long term (current) drug therapy: Secondary | ICD-10-CM

## 2022-03-16 DIAGNOSIS — J4520 Mild intermittent asthma, uncomplicated: Secondary | ICD-10-CM

## 2022-03-16 DIAGNOSIS — N182 Chronic kidney disease, stage 2 (mild): Secondary | ICD-10-CM

## 2022-03-16 DIAGNOSIS — J302 Other seasonal allergic rhinitis: Secondary | ICD-10-CM

## 2022-03-16 DIAGNOSIS — E1169 Type 2 diabetes mellitus with other specified complication: Secondary | ICD-10-CM | POA: Diagnosis not present

## 2022-03-16 DIAGNOSIS — E669 Obesity, unspecified: Secondary | ICD-10-CM

## 2022-03-16 DIAGNOSIS — R931 Abnormal findings on diagnostic imaging of heart and coronary circulation: Secondary | ICD-10-CM

## 2022-03-16 DIAGNOSIS — K219 Gastro-esophageal reflux disease without esophagitis: Secondary | ICD-10-CM

## 2022-03-16 DIAGNOSIS — E1122 Type 2 diabetes mellitus with diabetic chronic kidney disease: Secondary | ICD-10-CM | POA: Diagnosis not present

## 2022-03-16 DIAGNOSIS — K7581 Nonalcoholic steatohepatitis (NASH): Secondary | ICD-10-CM

## 2022-03-16 DIAGNOSIS — E785 Hyperlipidemia, unspecified: Secondary | ICD-10-CM

## 2022-03-16 DIAGNOSIS — E66811 Obesity, class 1: Secondary | ICD-10-CM

## 2022-03-16 DIAGNOSIS — F419 Anxiety disorder, unspecified: Secondary | ICD-10-CM

## 2022-03-16 DIAGNOSIS — I10 Essential (primary) hypertension: Secondary | ICD-10-CM

## 2022-03-16 NOTE — Progress Notes (Signed)
MEDICARE ANNUAL WELLNESS VISIT AND FOLLOW UP Assessment:   Welcome to Medicare Due annually  Aortic atherosclerosis ([place]) Control blood pressure, cholesterol, glucose, increase exercise.  Monitor lipids    Systemic lupus erythematosus with other organ involvement, unspecified SLE type ([place]) Follows with [facility] rheum-last seen 02/07/22. Note: Doesn't tolerate most abx - trigger flares; zpak is ok per patient  Polyneuropathy associated with underlying disease ([place]) Check feet daily, secondary to lupus, declines podiatry referral  Continue to monitor   Morbid obesity, unspecified obesity type ([place]) Discussed appropriate BMI Goal of losing 1 lb per month. Diet modification. Physical activity. Encouraged/praised to build confidence.    CKD stage 2 due to type 2 diabetes mellitus ([place]) Discussed how what you eat and drink can aide in kidney protection. Stay well hydrated. Avoid high salt foods. Avoid NSAIDS. Keep BP and BG well controlled.   Take medications as prescribed. Remain active and exercise as tolerated daily. Maintain weight.  Continue to monitor. Check CMP/GFR/Microablumin    Hyperlipidemia associated with type 2 diabetes mellitus ([place]) Discussed lifestyle modifications. Recommended diet heavy in fruits and veggies, omega 3's. Decrease consumption of animal meats, cheeses, and dairy products. Remain active and exercise as tolerated. Continue to monitor. Check lipids/TSH    Type 2 diabetes mellitus with stage 2 chronic kidney disease, without long-term current use of insulin ([name]) Declined metformin, working on lifestyle to try to control without meds per strong patient preference Education: Reviewed 'ABCs' of diabetes management  Discussed goals to be met and/or maintained include A1C (<7) Blood pressure (<130/80) Cholesterol (LDL <70) Continue Eye Exam yearly  Continue Dental Exam Q6 mo Discussed dietary recommendations Discussed Physical Activity  recommendations Foot exam UTD Check A1C    NASH (nonalcoholic steatohepatitis) Weight loss advised, avoid alcohol/tylenol, will Monitor LFTs Monitor CMP   Ascending aorta dilation ([name]) - CT 10/2020 Control BP, follow up 1 year   CAD/ [name] CCS 400+ Dr. [name] following, pending [id number] evaluation, has follow up planned Declines other med attempts Control blood pressure, cholesterol, glucose, increase exercise.  Denies chest pain   Primary hypertension Discussed DASH (Dietary Approaches to Stop Hypertension) DASH diet is lower in sodium than a typical American diet. Cut back on foods that are high in saturated fat, cholesterol, and trans fats. Eat more whole-grain foods, fish, poultry, and nuts Remain active and exercise as tolerated daily.  Monitor BP at home-Call if greater than 130/80.  Check CMP/CBC  Mild intermittent asthma without complication Monitor, avoid triggers, albuterol PRN   Gastroesophageal reflux disease, unspecified whether esophagitis present No suspected reflux complications ([place]/stricture). Lifestyle modification:  wt loss, avoid meals 2-3h before bedtime. Consider eliminating food triggers:  chocolate, caffeine, EtOH, acid/spicy food.    Anxiety Well managed by current regimen; benzo PRN rare use continue medications Stress management techniques discussed, increase water, good sleep hygiene discussed, increase exercise, and increase veggies.    Idiopathic gout, unspecified chronicity, unspecified site Continue allopurinol Monitor Uric acid during flares and CPE Discussed low purine diet   Vitamin D deficiency Continue supplement Monitor levels   Seasonal allergies Continue OTC antihistamine PRN Avoid triggers   Bifascicular block Monitor; verified not changed from last year;  Est/follows with cardiology    Medication management All medications discussed and reviewed in full. All questions and concerns regarding medications addressed.     Orders Placed This Encounter  Procedures   CBC with Differential/Platelet   COMPLETE METABOLIC PANEL WITH GFR   Lipid panel   Hemoglobin A1c  Notify office for further evaluation and treatment, questions or concerns if any reported s/s fail to improve.   The patient was advised to call back or seek an in-person evaluation if any symptoms worsen or if the condition fails to improve as anticipated.   Further disposition pending results of labs. Discussed med's effects and SE's.    I discussed the assessment and treatment plan with the patient. The patient was provided an opportunity to ask questions and all were answered. The patient agreed with the plan and demonstrated an understanding of the instructions.  Discussed med's effects and SE's. Screening labs and tests as requested with regular follow-up as recommended.  I provided 30 minutes of face-to-face time during this encounter including counseling, chart review, and critical decision making was preformed.   Future Appointments  Date Time Provider Department Center  09/08/2022 10:00 AM McKeown, William, MD [facility] None     Plan:   During the course of the visit the patient was educated and counseled about appropriate screening and preventive services including:   Pneumococcal vaccine  Influenza vaccine Prevnar 13 Td vaccine Screening electrocardiogram Colorectal cancer screening Diabetes screening Glaucoma screening Nutrition counseling    Subjective:  Sean Maldonado is a 66 y.o. male who presents for Medicare Annual Wellness Visit and 3 month follow up. He has GERD (gastroesophageal reflux disease); NASH (nonalcoholic steatohepatitis); Asthma; Seasonal allergies; Hyperlipidemia associated with type 2 diabetes mellitus ([name]); Hypertension; Diet-controlled type 2 diabetes mellitus ([name]); Vitamin D deficiency; Medication management; Gout; Lupus (systemic lupus erythematosus) ([name]); Lupus vasculitis ([name]); CKD stage 2  due to type 2 diabetes mellitus ([name]); Anxiety; Aortic atherosclerosis ([name])- CT 01/2020; Bifascicular block; Polyneuropathy associated with underlying disease ([name]) - secondary to lupus; Statin myopathy; Ascending aorta dilatation ([name]) - 4.1 cm 10/2020; Agatston coronary artery calcium score greater than 400; and Obesity (BMI 30.0-34.9) on their problem list.  He is single, no kids.   Patient was diagnosed with cutaneous Lupus in 2017 and is followed at [facility]  Aug 2018 patient had a severe intolerant or allergic reaction to Imuran. Since then his cutaneous Lupus symptoms seem reasonably controlled on Prednisone 5 mg daily. Reports hasn't had a flare in 4 years. He has some related peripheral neuropathic pain, reports well managed with gabapentin 100 mg PRN.    BMI is Body mass index is 33.47 kg/m., he has not been working on diet and exercise. Wt Readings from Last 3 Encounters:  03/16/22 236 lb 9.6 oz (107.3 kg)  12/09/21 230 lb 12.8 oz (104.7 kg)  09/03/21 232 lb (105.2 kg)   He had a coronary calcium score performed 10/28/2020 which was 2234 with disease spread Alprep in all 3 coronary arteries and was evaluated by Dr. Berry. Had myoview 02/05/2021 showing no previous MI/ischemia, however intermediate risk with reduced systolic function. EF 48%. CT from 10/2020 also showed 4.1 cm ascending aortic aneurysm recommended for 1 year follow up by CTA/MRA.     His blood pressure has been controlled at home, today their BP is BP: 110/62 He does not workout. He denies chest pain, shortness of breath, dizziness.   He has aortic atherosclerosis per Ct 01/2020   He is on cholesterol medication (cannot tolerate lipitor, zocor, rosuvastatin on zetia 10 mg daily, sent in nexlizet last visit but insurance declined, Dr. Hilty was attempting repatha but patient states never started due to severe flare, declines at this time, has been taking RYRS, fasting today) and denies myalgias. Declines lipid clinic  referral,   receptive to CT coronary calcium. His cholesterol is not at goal. The cholesterol last visit was:   He is on cholesterol medication and denies myalgias. His cholesterol is not at goal. The cholesterol last visit was:   Lab Results  Component Value Date   CHOL 175 12/09/2021   HDL 27 (L) 12/09/2021   LDLCALC 118 (H) 12/09/2021   TRIG 178 (H) 12/09/2021   CHOLHDL 6.5 (H) 12/09/2021   He has not been working on diet and exercise for DM2 or prediabetes, T2 diabetes newly up 7+ in 2022 and denies polydipsia and polyuria. Last A1C in the office was:  Lab Results  Component Value Date   HGBA1C 6.0 (H) 12/09/2021   Last GFR Lab Results  Component Value Date   EGFR 92 12/09/2021   Patient is on Vitamin D supplement.   Lab Results  Component Value Date   VD25OH 109 (H) 09/03/2021     Patient is on allopurinol for gout and does not report a recent flare.   PSA have been well controlled:  Lab Results  Component Value Date   PSA 0.30 09/03/2021   PSA 0.28 09/03/2020   PSA 0.3 09/04/2019     Medication Review:  Current Outpatient Medications (Endocrine & Metabolic):    predniSONE (DELTASONE) 5 MG tablet, Take 1-2 tabs daily as directed by doctor for lupus.  Current Outpatient Medications (Cardiovascular):    atenolol (TENORMIN) 100 MG tablet, TAKE ONE TABLET BY MOUTH DAILY FOR FOR BLOOD PRESSURE   ezetimibe (ZETIA) 10 MG tablet, Take 1 tablet Daily for Cholesterol   lisinopril-hydrochlorothiazide (ZESTORETIC) 20-25 MG tablet, Take 1 tablet Daily for Blod Pressure & Fluid Retention /Ankle Swelling   Current Outpatient Medications (Analgesics):    allopurinol (ZYLOPRIM) 300 MG tablet, TAKE 1 TABLET DAILY TO PREVENT GOUT.   aspirin EC 81 MG tablet, Take 81 mg by mouth daily.    Current Outpatient Medications (Other):    ALPRAZolam (XANAX) 1 MG tablet, Take 1/2 - 1 tablet 2 - 3 x /day ONLY if needed for Anxiety Attack & limit to 5 days /week to avoid Addiction &  Dementia   Cholecalciferol (VITAMIN D3) 125 MCG (5000 UT) CAPS, Take 1 capsule (5,000 Units total) by mouth daily.   clobetasol cream (TEMOVATE) 0.05 %, Apply twice daily to affected areas of the body for psoriasis. Never to the face.   gabapentin (NEURONTIN) 300 MG capsule, Take 1 caps three times a day as needed for neuropathy pain.   hydrOXYzine (ATARAX/VISTARIL) 25 MG tablet, TAKE 2 TABLETS THREE TIMES DAILY AS NEEDED FOR ITCHING.   Magnesium 400 MG TABS, Take 1 tablet by mouth daily.   MILK THISTLE PO, Take 1 tablet by mouth daily.   Multiple Vitamin (MULTIVITAMIN) capsule, Take 1 capsule by mouth daily.   Omega-3 Fatty Acids (FISH OIL PO), Take 1 capsule by mouth daily.   Probiotic Product (PROBIOTIC DAILY) CAPS, Take 1 capsule by mouth daily.   Red Yeast Rice Extract (RED YEAST RICE PO), Take 1,200 mg by mouth daily.   TURMERIC PO, Take 2,000 mg by mouth daily.    zinc gluconate 50 MG tablet, Take 50 mg by mouth daily.   diclofenac Sodium (VOLTAREN) 1 % GEL, Apply 4 g topically 4 (four) times daily. (Patient not taking: Reported on 03/16/2022)   triamcinolone cream (KENALOG) 0.1 %, Apply 1 application topically 3 (three) times daily. (Patient not taking: Reported on 03/16/2022)  Allergies: Allergies  Allergen Reactions   Doxycycline       Lupus flair   Imuran [Azathioprine] Rash   Lipitor [Atorvastatin]     Myalgias    Penicillins     Causes lupus flare ups   Rosuvastatin Diarrhea   Zocor [Simvastatin]     Myalgias    Current Problems (verified) has GERD (gastroesophageal reflux disease); NASH (nonalcoholic steatohepatitis); Asthma; Seasonal allergies; Hyperlipidemia associated with type 2 diabetes mellitus ([place]); Hypertension; Diet-controlled type 2 diabetes mellitus ([place]); Vitamin D deficiency; Medication management; Gout; Lupus (systemic lupus erythematosus) ([place]); 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  Screening Tests Immunization History  Administered Date(s) Administered   Influenza Inj Mdck Quad With Preservative 03/04/2019, 03/11/2020   Influenza Split 02/26/2013, 02/09/2015   Influenza, Quadrivalent, Recombinant, Inj, Pf 03/14/2016, 02/08/2017, 01/31/2018   PPD Test 07/22/2013, 07/23/2014, 07/24/2015, 08/14/2017, 08/27/2018, 09/04/2019   Pneumococcal Conjugate-13 03/14/2016   Pneumococcal Polysaccharide-23 05/22/2008   Tdap 07/18/2012   Health Maintenance  Topic Date Due   COVID-19 Vaccine (1) Never done   Zoster Vaccines- Shingrix (1 of 2) Never done   OPHTHALMOLOGY EXAM  06/27/2018   COLONOSCOPY (Pts 45-87yr Insurance coverage will need to be confirmed)  07/31/2021   INFLUENZA VACCINE  12/07/2021   Pneumonia Vaccine 65 Years old (3 - PPSV23 or PCV20) 01/01/2022   HEMOGLOBIN A1C  06/11/2022   TETANUS/TDAP  07/19/2022   Diabetic kidney evaluation - Urine ACR  09/04/2022   FOOT EXAM  09/04/2022   Diabetic kidney evaluation - GFR measurement  12/10/2022   Hepatitis C Screening  Completed   HIV Screening  Completed   HPV VACCINES  Aged Out   Health Maintenance  Topic Date Due   OPHTHALMOLOGY EXAM  06/27/2018   COLONOSCOPY (Pts 45-4102yrInsurance coverage will need to be confirmed)  07/31/2021   COVID-19 Vaccine (1) 09/19/2021 (Originally 07/04/1956)   Zoster Vaccines- Shingrix (1 of 2) 12/03/2021 (Originally 01/02/1975)   HEMOGLOBIN A1C  09/06/2021   INFLUENZA VACCINE  12/07/2021   TETANUS/TDAP  07/19/2022   FOOT EXAM  09/04/2022   Hepatitis C Screening  Completed   HIV Screening  Completed   HPV VACCINES  Aged Out    Shingrix: declines Covid 1934declines    Last colonoscopy: 2013  normal, Dr. PaSharlett IlesRecall Q10 Years Negative Cologuard 12/2021 - Rescreen 3 years 2026  EGD:  07/2011  Names of Other Physician/Practitioners you currently use: [facility] for [facility]  Eye Exam: last 06/2021, monitoring cataracts and MD, Dr. MiSabra Heckreport requested for [facility]: last month, 2023, goes q6m98mollows every 6 mmo, derm, last visit    Patient Care Team: McKUnk PintoD as PCP - General (Internal Medicine) McKUnk PintoD as Referring Physician (Internal Medicine)  Surgical: He  has a past surgical history that includes Tonsillectomy; Wisdom tooth extraction; Axillary lymph node biopsy (Right, 08/27/2012); Cholecystectomy (N/A, 12/28/2012); and ORIF wrist fracture (Left, 01/17/2020). Family His family history includes Alcoholism in his father; Diabetes in his mother; Heart disease in his mother. Social history  He reports that he has never smoked. He has never used smokeless tobacco. He reports current alcohol use. He reports that he does not use drugs.  MEDICARE WELLNESS OBJECTIVES: Physical activity:  Cardiac risk factors:   Depression/mood screen:      03/16/2022   11:10 AM  Depression screen PHQ 2/9  Decreased Interest 0  Down, Depressed, Hopeless 0  PHQ - 2 Score 0    ADLs:     03/16/2022   10:51 AM  In your present state of health, do you have any difficulty performing the following activities:  Hearing? 0  Vision? 0  Difficulty concentrating or making decisions? 0  Walking or climbing stairs? 0  Dressing or bathing? 0  Doing errands, shopping? 0  Preparing Food and eating ? N  Using the Toilet? N  In the past six months, have you accidently leaked urine? N  Do you have problems with loss of bowel control? N  Managing your Medications? N  Managing your Finances? N  Housekeeping or managing your Housekeeping? N     Cognitive Testing  Alert? Yes  Normal Appearance?Yes  Oriented to  person? Yes  Place? Yes   Time? Yes  Recall of three objects?  Yes  Can perform simple calculations? Yes  Displays appropriate judgment?Yes  Can read the correct time from a watch face?Yes  EOL planning: Does Patient Have a Medical Advance Directive?: Yes Type of Advance Directive: Living will   Objective:   Today's Vitals   03/16/22 1024  BP: 110/62  Pulse: 70  Temp: 97.7 F (36.5 C)  SpO2: 98%  Weight: 236 lb 9.6 oz (107.3 kg)  Height: 5' 10.5" (1.791 m)   Body mass index is 33.47 kg/m.  General appearance: alert, no distress, WD/WN, male HEENT: normocephalic, sclerae anicteric, TMs pearly, nares patent, no discharge or erythema, pharynx normal Oral cavity: MMM, no lesions Neck: supple, no lymphadenopathy, no thyromegaly, no masses Heart: RRR, normal S1, S2, no murmurs Lungs: CTA bilaterally, no wheezes, rhonchi, or rales Abdomen: +bs, soft, non tender, non distended, no masses, no hepatomegaly, no splenomegaly Musculoskeletal: nontender, no swelling, no obvious deformity Extremities: no edema, no cyanosis, no clubbing Pulses: 2+ symmetric, upper and lower extremities, normal cap refill Neurological: alert, oriented x 3, CN2-12 intact, strength normal upper extremities and lower extremities, sensation normal throughout, DTRs 2+ throughout, no cerebellar signs, gait normal Psychiatric: normal affect, behavior normal, pleasant  Skin:  Scattered areas of hypopigmentations, dry skin, bilateral hands onychomycosis  Medicare Attestation I have personally reviewed: The patient's medical and social history Their use of alcohol, tobacco or illicit drugs Their current medications and supplements The patient's functional ability including ADLs,fall risks, home safety risks, cognitive, and hearing and visual impairment Diet and physical activities Evidence for depression or mood disorders  The patient's weight, height, BMI, and visual acuity have been recorded in the chart.  I  have made referrals, counseling, and provided education to the patient based on review of the above and I have provided the patient with a written personalized care plan for preventive services.     Darrol Jump, NP   03/16/2022

## 2022-03-16 NOTE — Patient Instructions (Signed)

## 2022-03-17 LAB — HEMOGLOBIN A1C
Hgb A1c MFr Bld: 6.3 % of total Hgb — ABNORMAL HIGH (ref <5.7)
Mean Plasma Glucose: 134 mg/dL
eAG (mmol/L): 7.4 mmol/L

## 2022-03-17 LAB — LIPID PANEL
Cholesterol: 192 mg/dL (ref <200)
HDL: 34 mg/dL — ABNORMAL LOW (ref >=40)
LDL Cholesterol (Calc): 135 mg/dL (calc) — ABNORMAL HIGH
Non-HDL Cholesterol (Calc): 158 mg/dL (calc) — ABNORMAL HIGH (ref <130)
Total CHOL/HDL Ratio: 5.6 (calc) — ABNORMAL HIGH (ref <5.0)
Triglycerides: 121 mg/dL (ref <150)

## 2022-03-17 LAB — COMPLETE METABOLIC PANEL WITH GFR
AG Ratio: 1.2 (calc) (ref 1.0–2.5)
ALT: 56 U/L — ABNORMAL HIGH (ref 9–46)
AST: 65 U/L — ABNORMAL HIGH (ref 10–35)
Albumin: 4 g/dL (ref 3.6–5.1)
Alkaline phosphatase (APISO): 66 U/L (ref 35–144)
BUN: 16 mg/dL (ref 7–25)
CO2: 29 mmol/L (ref 20–32)
Calcium: 9.4 mg/dL (ref 8.6–10.3)
Chloride: 98 mmol/L (ref 98–110)
Creat: 0.84 mg/dL (ref 0.70–1.35)
Globulin: 3.3 g/dL (calc) (ref 1.9–3.7)
Glucose, Bld: 131 mg/dL — ABNORMAL HIGH (ref 65–99)
Potassium: 4.4 mmol/L (ref 3.5–5.3)
Sodium: 135 mmol/L (ref 135–146)
Total Bilirubin: 0.6 mg/dL (ref 0.2–1.2)
Total Protein: 7.3 g/dL (ref 6.1–8.1)
eGFR: 97 mL/min/{1.73_m2} (ref >=60)

## 2022-03-17 LAB — CBC WITH DIFFERENTIAL/PLATELET
Absolute Monocytes: 635 cells/uL (ref 200–950)
Basophils Absolute: 51 cells/uL (ref 0–200)
Basophils Relative: 0.7 %
Eosinophils Absolute: 131 cells/uL (ref 15–500)
Eosinophils Relative: 1.8 %
HCT: 42.9 % (ref 38.5–50.0)
Hemoglobin: 14.6 g/dL (ref 13.2–17.1)
Lymphs Abs: 1226 cells/uL (ref 850–3900)
MCH: 33.2 pg — ABNORMAL HIGH (ref 27.0–33.0)
MCHC: 34 g/dL (ref 32.0–36.0)
MCV: 97.5 fL (ref 80.0–100.0)
MPV: 10.1 fL (ref 7.5–12.5)
Monocytes Relative: 8.7 %
Neutro Abs: 5256 cells/uL (ref 1500–7800)
Neutrophils Relative %: 72 %
Platelets: 235 10*3/uL (ref 140–400)
RBC: 4.4 10*6/uL (ref 4.20–5.80)
RDW: 12.4 % (ref 11.0–15.0)
Total Lymphocyte: 16.8 %
WBC: 7.3 10*3/uL (ref 3.8–10.8)

## 2022-04-15 ENCOUNTER — Telehealth: Payer: Self-pay

## 2022-04-15 NOTE — Patient Outreach (Signed)
  Care Coordination   04/15/2022 Name: Sean Maldonado MRN: [id number] DOB: 1955/11/11   Care Coordination Outreach Attempts:  An unsuccessful telephone outreach was attempted today to offer the patient information about available care coordination services as a benefit of their health plan.   Follow Up Plan:  Additional outreach attempts will be made to offer the patient care coordination information and services.   Encounter Outcome:  No Answer   Care Coordination Interventions:  No, not indicated     Jone Baseman, RN, MSN [place] [facility] Care Management Coordinator Direct Line [phone]

## 2022-04-22 ENCOUNTER — Other Ambulatory Visit: Payer: Self-pay | Admitting: Internal Medicine

## 2022-04-22 MED ORDER — AZITHROMYCIN 250 MG PO TABS: ORAL_TABLET | ORAL | 0 refills | Status: DC

## 2022-05-05 ENCOUNTER — Telehealth: Payer: Self-pay

## 2022-05-05 NOTE — Patient Outreach (Signed)
  Care Coordination   05/05/2022 Name: Sean Maldonado MRN: [id number] DOB: August 20, 1955   Care Coordination Outreach Attempts:  A second unsuccessful outreach was attempted today to offer the patient with information about available care coordination services as a benefit of their health plan.     Follow Up Plan:  Additional outreach attempts will be made to offer the patient care coordination information and services.   Encounter Outcome:  No Answer   Care Coordination Interventions:  No, not indicated    Jone Baseman, RN, MSN [place] [facility] Care Management Coordinator Direct Line [phone]

## 2022-05-20 ENCOUNTER — Telehealth: Payer: Self-pay

## 2022-05-20 NOTE — Patient Outreach (Signed)
  Care Coordination   05/20/2022 Name: Sean Maldonado MRN: [id number] DOB: [birth year]   Care Coordination Outreach Attempts:  A third unsuccessful outreach was attempted today to offer the patient with information about available care coordination services as a benefit of their health plan.   Follow Up Plan:  No further outreach attempts will be made at this time. We have been unable to contact the patient to offer or enroll patient in care coordination services  Encounter Outcome:  No Answer   Care Coordination Interventions:  No, not indicated    Jone Baseman, RN, MSN [place] [facility] Care Management Coordinator Direct Line [phone]

## 2022-06-14 ENCOUNTER — Other Ambulatory Visit: Payer: Self-pay | Admitting: Nurse Practitioner

## 2022-06-15 ENCOUNTER — Telehealth: Payer: Self-pay

## 2022-06-15 ENCOUNTER — Other Ambulatory Visit: Payer: Self-pay | Admitting: Nurse Practitioner

## 2022-06-15 NOTE — Telephone Encounter (Signed)
Refill request for Alprazolam  [facility]

## 2022-06-16 NOTE — Telephone Encounter (Signed)
Patient aware and will discuss at next office visit on 06/23/22

## 2022-06-20 ENCOUNTER — Ambulatory Visit (INDEPENDENT_AMBULATORY_CARE_PROVIDER_SITE_OTHER): Payer: Medicare Other | Admitting: Nurse Practitioner

## 2022-06-20 ENCOUNTER — Ambulatory Visit: Payer: Medicare Other | Admitting: Nurse Practitioner

## 2022-06-20 ENCOUNTER — Encounter: Payer: Self-pay | Admitting: Nurse Practitioner

## 2022-06-20 VITALS — BP 128/80 | HR 80 | Temp 97.5°F | Ht 70.5 in | Wt 254.0 lb

## 2022-06-20 DIAGNOSIS — E1122 Type 2 diabetes mellitus with diabetic chronic kidney disease: Secondary | ICD-10-CM

## 2022-06-20 DIAGNOSIS — I10 Essential (primary) hypertension: Secondary | ICD-10-CM

## 2022-06-20 DIAGNOSIS — I700 Atherosclerosis of aorta: Secondary | ICD-10-CM

## 2022-06-20 DIAGNOSIS — J302 Other seasonal allergic rhinitis: Secondary | ICD-10-CM

## 2022-06-20 DIAGNOSIS — E559 Vitamin D deficiency, unspecified: Secondary | ICD-10-CM

## 2022-06-20 DIAGNOSIS — J4520 Mild intermittent asthma, uncomplicated: Secondary | ICD-10-CM

## 2022-06-20 DIAGNOSIS — I452 Bifascicular block: Secondary | ICD-10-CM

## 2022-06-20 DIAGNOSIS — K219 Gastro-esophageal reflux disease without esophagitis: Secondary | ICD-10-CM

## 2022-06-20 DIAGNOSIS — F419 Anxiety disorder, unspecified: Secondary | ICD-10-CM

## 2022-06-20 DIAGNOSIS — G63 Polyneuropathy in diseases classified elsewhere: Secondary | ICD-10-CM

## 2022-06-20 DIAGNOSIS — I77810 Thoracic aortic ectasia: Secondary | ICD-10-CM

## 2022-06-20 DIAGNOSIS — K7581 Nonalcoholic steatohepatitis (NASH): Secondary | ICD-10-CM

## 2022-06-20 DIAGNOSIS — M1000 Idiopathic gout, unspecified site: Secondary | ICD-10-CM

## 2022-06-20 DIAGNOSIS — E1169 Type 2 diabetes mellitus with other specified complication: Secondary | ICD-10-CM

## 2022-06-20 DIAGNOSIS — N182 Chronic kidney disease, stage 2 (mild): Secondary | ICD-10-CM

## 2022-06-20 DIAGNOSIS — M3219 Other organ or system involvement in systemic lupus erythematosus: Secondary | ICD-10-CM

## 2022-06-20 DIAGNOSIS — Z79899 Other long term (current) drug therapy: Secondary | ICD-10-CM

## 2022-06-20 DIAGNOSIS — E785 Hyperlipidemia, unspecified: Secondary | ICD-10-CM

## 2022-06-20 MED ORDER — ALPRAZOLAM 1 MG PO TABS: ORAL_TABLET | ORAL | 0 refills | Status: DC

## 2022-06-20 NOTE — Progress Notes (Signed)
FOLLOW UP Assessment:   Aortic atherosclerosis ([name]) Control blood pressure, cholesterol, glucose, increase exercise.  Monitor lipids    Systemic lupus erythematosus with other organ involvement, unspecified SLE type ([place]) Follows with [facility] rheum-last seen 02/07/22. Note: Doesn't tolerate most abx - trigger flares; zpak is ok per patient  Polyneuropathy associated with underlying disease ([place]) Check feet daily, secondary to lupus, declines podiatry referral  Continue to monitor   Morbid obesity, unspecified obesity type ([place]) Discussed appropriate BMI Goal of losing 1 lb per month. Diet modification. Physical activity. Encouraged/praised to build confidence.    CKD stage 2 due to type 2 diabetes mellitus ([place]) Discussed how what you eat and drink can aide in kidney protection. Stay well hydrated. Avoid high salt foods. Avoid NSAIDS. Keep BP and BG well controlled.   Take medications as prescribed. Remain active and exercise as tolerated daily. Maintain weight.  Continue to monitor. Check CMP/GFR/Microablumin    Hyperlipidemia associated with type 2 diabetes mellitus ([place]) Discussed lifestyle modifications. Recommended diet heavy in fruits and veggies, omega 3's. Decrease consumption of animal meats, cheeses, and dairy products. Remain active and exercise as tolerated. Continue to monitor. Check lipids/TSH    Type 2 diabetes mellitus with stage 2 chronic kidney disease, without long-term current use of insulin ([name]) Declined metformin, working on lifestyle to try to control without meds per strong patient preference Education: Reviewed 'ABCs' of diabetes management  Discussed goals to be met and/or maintained include A1C (<7) Blood pressure (<130/80) Cholesterol (LDL <70) Continue Eye Exam yearly  Continue Dental Exam Q6 mo Discussed dietary recommendations Discussed Physical Activity recommendations Foot exam UTD Check A1C    NASH (nonalcoholic  steatohepatitis) Weight loss advised, avoid alcohol/tylenol, will Monitor LFTs Monitor CMP   Ascending aorta dilation ([name]) - CT 10/2020 Control BP Yearly screening   CAD/ [name] CCS 400+ Dr. [name] following, pending [id number] evaluation, has follow up planned Declines other med attempts Control blood pressure, cholesterol, glucose, increase exercise.  Denies chest pain   Primary hypertension Discussed DASH (Dietary Approaches to Stop Hypertension) DASH diet is lower in sodium than a typical American diet. Cut back on foods that are high in saturated fat, cholesterol, and trans fats. Eat more whole-grain foods, fish, poultry, and nuts Remain active and exercise as tolerated daily.  Monitor BP at home-Call if greater than 130/80.  Check CMP/CBC  Mild intermittent asthma without complication Monitor, avoid triggers, albuterol PRN   Gastroesophageal reflux disease, unspecified whether esophagitis present No suspected reflux complications ([place]/stricture). Lifestyle modification:  wt loss, avoid meals 2-3h before bedtime. Consider eliminating food triggers:  chocolate, caffeine, EtOH, acid/spicy food.    Anxiety Well managed by current regimen; benzo PRN rare use continue medications Stress management techniques discussed, increase water, good sleep hygiene discussed, increase exercise, and increase veggies.    Idiopathic gout, unspecified chronicity, unspecified site Continue allopurinol Monitor Uric acid during flares and CPE Discussed low purine diet   Vitamin D deficiency Continue supplement Monitor levels   Seasonal allergies Continue OTC antihistamine PRN Avoid triggers   Bifascicular block Monitor; verified not changed from last year;  Est/follows with cardiology    Medication management All medications discussed and reviewed in full. All questions and concerns regarding medications addressed.   Orders Placed This Encounter  Procedures   CBC with  Differential/Platelet   COMPLETE METABOLIC PANEL WITH GFR   Lipid panel   Hemoglobin A1c   Meds ordered this encounter  Medications   ALPRAZolam (XANAX) 1 MG tablet  Sig: Take 1/2 - 1 tablet 2 - 3 x /day ONLY if needed for Anxiety Attack & limit to 5 days /week to avoid Addiction & Dementia    Dispense:  90 tablet    Refill:  0    Order Specific Question:   Supervising Provider    Answer:   [name] [phone]   Notify office for further evaluation and treatment, questions or concerns if any reported s/s fail to improve.   The patient was advised to call back or seek an in-person evaluation if any symptoms worsen or if the condition fails to improve as anticipated.   Further disposition pending results of labs. Discussed med's effects and SE's.    I discussed the assessment and treatment plan with the patient. The patient was provided an opportunity to ask questions and all were answered. The patient agreed with the plan and demonstrated an understanding of the instructions.  Discussed med's effects and SE's. Screening labs and tests as requested with regular follow-up as recommended.  I provided 20 minutes of face-to-face time during this encounter including counseling, chart review, and critical decision making was preformed.   Future Appointments  Date Time Provider [place]  11/01/2022 10:00 AM [name], MD [facility] None    Subjective:  QUATEZ HILT is a 67 y.o. male who presents for a 3 month follow up. 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  Overall he reports feeling well today.  He has no additional concerns at this time.  States that he is scheduled to have cataract surgery 07/18/22 on right eye followed by the left eye on 08/08/22.  He was diagnosed with cutaneous Lupus in 2017 and is followed at [facility]  Aug 2018 patient had a severe intolerant or allergic reaction to Imuran. Since then his cutaneous Lupus symptoms seem reasonably controlled on Prednisone 5 mg daily. Reports hasn't had a flare in 4 years. He has some related peripheral neuropathic pain, reports well managed with gabapentin 100 mg PRN.    BMI is Body mass index is 35.93 kg/m., he has not been working on diet and exercise.  Admitting to gaining weight over the holiday season.   Wt Readings from Last 3 Encounters:  06/20/22 254 lb (115.2 kg)  03/16/22 236 lb 9.6 oz (107.3 kg)  12/09/21 230 lb 12.8 oz (104.7 kg)   He had a coronary calcium score performed 10/28/2020 which was 2234 with disease spread Alprep in all 3 coronary arteries and was evaluated by Dr. [name]. Had myoview 02/05/2021 showing no previous MI/ischemia, however intermediate risk with reduced systolic function. EF 48%. CT from 10/2020 also showed 4.1 cm ascending aortic aneurysm recommended for 1 year follow up by CTA/MRA.     His blood pressure has been controlled at home, today their BP is BP: 128/80 He does not workout. He denies chest pain, shortness of breath, dizziness.   He has aortic atherosclerosis per Ct 01/2020   He is on cholesterol  medication (cannot tolerate lipitor, zocor, rosuvastatin on zetia 10 mg daily, sent in nexlizet last visit but insurance declined, Dr. Debara Pickett was attempting repatha but patient states  never started due to severe flare, declines at this time, has been taking RYRS, fasting today) and denies myalgias. Declines lipid clinic referral, receptive to CT coronary calcium. His cholesterol is not at goal. The cholesterol last visit was:   He is on cholesterol medication and denies myalgias. His cholesterol is not at goal. The cholesterol last visit was:   Lab Results  Component Value Date   CHOL 192 03/16/2022   HDL 34 (L) 03/16/2022   LDLCALC 135 (H) 03/16/2022   TRIG 121 03/16/2022   CHOLHDL 5.6 (H) 03/16/2022   He has not been working on diet and exercise for DM2 or prediabetes, T2 diabetes newly up 7+ in 2022 and denies polydipsia and polyuria. Last A1C in the office was:  Lab Results  Component Value Date   HGBA1C 6.3 (H) 03/16/2022   Last GFR Lab Results  Component Value Date   EGFR 97 03/16/2022   Patient is on Vitamin D supplement.   Lab Results  Component Value Date   VD25OH 109 (H) 09/03/2021     Patient is on allopurinol for gout and does not report a recent flare.   Medication Review:  Current Outpatient Medications (Endocrine & Metabolic):    predniSONE (DELTASONE) 5 MG tablet, Take 1-2 tabs daily as directed by doctor for lupus.  Current Outpatient Medications (Cardiovascular):    atenolol (TENORMIN) 100 MG tablet, TAKE ONE TABLET BY MOUTH DAILY FOR FOR BLOOD PRESSURE   ezetimibe (ZETIA) 10 MG tablet, Take 1 tablet Daily for Cholesterol   lisinopril-hydrochlorothiazide (ZESTORETIC) 20-25 MG tablet, Take 1 tablet Daily for Blod Pressure & Fluid Retention /Ankle Swelling   Current Outpatient Medications (Analgesics):    allopurinol (ZYLOPRIM) 300 MG tablet, TAKE 1 TABLET DAILY TO PREVENT GOUT.   aspirin EC 81 MG tablet, Take 81 mg by mouth daily.    Current Outpatient Medications (Other):    ALPRAZolam (XANAX) 1 MG tablet, Take 1/2 - 1 tablet 2 - 3 x /day ONLY if needed for Anxiety Attack & limit to 5 days /week to avoid Addiction & Dementia    azithromycin (ZITHROMAX) 250 MG tablet, Take 2 tablets with Food on  Day 1, then 1 tablet Daily with Food for Sinusitis / Bronchitis   Cholecalciferol (VITAMIN D3) 125 MCG (5000 UT) CAPS, Take 1 capsule (5,000 Units total) by mouth daily.   clobetasol cream (TEMOVATE) 0.05 %, Apply twice daily to affected areas of the body for psoriasis. Never to the face.   gabapentin (NEURONTIN) 300 MG capsule, Take 1 caps three times a day as needed for neuropathy pain.   hydrOXYzine (ATARAX/VISTARIL) 25 MG tablet, TAKE 2 TABLETS THREE TIMES DAILY AS NEEDED FOR ITCHING.   Magnesium 400 MG TABS, Take 1 tablet by mouth daily.   MILK THISTLE PO, Take 1 tablet by mouth daily.   Multiple Vitamin (MULTIVITAMIN) capsule, Take 1 capsule by mouth daily.   Omega-3 Fatty Acids (FISH OIL PO), Take 1 capsule by mouth daily.   Probiotic Product (PROBIOTIC DAILY) CAPS, Take 1 capsule by mouth daily.   Red Yeast Rice Extract (RED YEAST RICE PO), Take 1,200 mg by mouth daily.   triamcinolone cream (KENALOG) 0.1 %, Apply 1 application topically 3 (three) times daily.   TURMERIC PO, Take 2,000 mg by mouth daily.    zinc gluconate 50 MG tablet, Take  50 mg by mouth daily.  Allergies: Allergies  Allergen Reactions   Doxycycline     Lupus flair   Imuran [Azathioprine] Rash   Lipitor [Atorvastatin]     Myalgias    Penicillins     Causes lupus flare ups   Rosuvastatin Diarrhea   Zocor [Simvastatin]     Myalgias    Current Problems (verified) has GERD (gastroesophageal reflux disease); NASH (nonalcoholic steatohepatitis); Asthma; Seasonal allergies; Hyperlipidemia associated with type 2 diabetes mellitus ([place]); Hypertension; Diet-controlled type 2 diabetes mellitus (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  Screening Tests Immunization History  Administered Date(s) Administered   Influenza Inj Mdck Quad With Preservative 03/04/2019, 03/11/2020   Influenza Split 02/26/2013, 02/09/2015   Influenza, Quadrivalent, Recombinant, Inj, Pf 03/14/2016, 02/08/2017, 01/31/2018   PPD Test 07/22/2013, 07/23/2014, 07/24/2015, 08/14/2017, 08/27/2018, 09/04/2019   Pneumococcal Conjugate-13 03/14/2016   Pneumococcal Polysaccharide-23 05/22/2008   Tdap 07/18/2012   Health Maintenance  Topic Date Due   COVID-19 Vaccine (1) Never done   Zoster Vaccines- Shingrix (1 of 2) Never done   OPHTHALMOLOGY EXAM  06/27/2018   COLONOSCOPY (Pts 45-60yr Insurance coverage will need to be confirmed)  07/31/2021   INFLUENZA VACCINE  12/07/2021   Pneumonia Vaccine 66 Years old (3 of 3 - PPSV23 or PCV20) 01/01/2022   DTaP/Tdap/Td (2 - Td or Tdap) 07/19/2022   Diabetic kidney evaluation - Urine ACR  09/04/2022   FOOT EXAM  09/04/2022   HEMOGLOBIN A1C  09/14/2022   Diabetic kidney evaluation - eGFR measurement  03/17/2023   Medicare Annual Wellness (AWV)  03/17/2023   Hepatitis C Screening  Completed   HIV Screening  Completed   HPV VACCINES  Aged Out    Names of Other Physician/Practitioners you currently use:  Patient Care Team: MUnk Pinto MD as PCP - General (Internal Medicine) MUnk Pinto MD as Referring Physician (Internal Medicine)  Surgical: He  has a past surgical history that includes Tonsillectomy; Wisdom tooth extraction; Axillary lymph node biopsy (Right, 08/27/2012); Cholecystectomy (N/A, 12/28/2012); and ORIF wrist fracture (Left, 01/17/2020). Family His family history includes Alcoholism in his father; Diabetes in his mother; Heart disease in his  mother. Social history  He reports that he has never smoked. He has never used smokeless tobacco. He reports current alcohol use. He reports that he does not use drugs.  Objective:   Today's Vitals   06/20/22 0922  BP: 128/80  Pulse: 80  Temp: (!) 97.5 F (36.4 C)  SpO2: 99%  Weight: 254 lb (115.2 kg)  Height: 5' 10.5" (1.791 m)    Body mass index is 35.93 kg/m.  General appearance: alert, no distress, WD/WN, male HEENT: normocephalic, sclerae anicteric, TMs pearly, nares patent, no discharge or erythema, pharynx normal Oral cavity: MMM, no lesions Neck: supple, no lymphadenopathy, no thyromegaly, no masses Heart: RRR, normal S1, S2, no murmurs Lungs: CTA bilaterally, no wheezes, rhonchi, or rales Abdomen: +bs, soft, non tender, non distended, no masses, no hepatomegaly, no splenomegaly Musculoskeletal: nontender, no swelling, no obvious deformity Extremities: no edema, no cyanosis, no clubbing Pulses: 2+ symmetric, upper and lower extremities, normal cap refill Neurological: alert, oriented  x 3, CN2-12 intact, strength normal upper extremities and lower extremities, sensation normal throughout, DTRs 2+ throughout, no cerebellar signs, gait normal Psychiatric: normal affect, behavior normal, pleasant  Skin:  Scattered areas of hypopigmentations, dry skin, bilateral hands onychomycosis  Yenifer Saccente, NP   06/20/2022

## 2022-06-20 NOTE — Patient Instructions (Signed)

## 2022-06-21 LAB — CBC WITH DIFFERENTIAL/PLATELET
Absolute Monocytes: 672 cells/uL (ref 200–950)
Basophils Absolute: 29 cells/uL (ref 0–200)
Basophils Relative: 0.4 %
Eosinophils Absolute: 270 cells/uL (ref 15–500)
Eosinophils Relative: 3.7 %
HCT: 41.8 % (ref 38.5–50.0)
Hemoglobin: 14.3 g/dL (ref 13.2–17.1)
Lymphs Abs: 1854 cells/uL (ref 850–3900)
MCH: 32.2 pg (ref 27.0–33.0)
MCHC: 34.2 g/dL (ref 32.0–36.0)
MCV: 94.1 fL (ref 80.0–100.0)
MPV: 10.4 fL (ref 7.5–12.5)
Monocytes Relative: 9.2 %
Neutro Abs: 4475 cells/uL (ref 1500–7800)
Neutrophils Relative %: 61.3 %
Platelets: 242 10*3/uL (ref 140–400)
RBC: 4.44 10*6/uL (ref 4.20–5.80)
RDW: 12.6 % (ref 11.0–15.0)
Total Lymphocyte: 25.4 %
WBC: 7.3 10*3/uL (ref 3.8–10.8)

## 2022-06-21 LAB — COMPLETE METABOLIC PANEL WITH GFR
AG Ratio: 1.1 (calc) (ref 1.0–2.5)
ALT: 51 U/L — ABNORMAL HIGH (ref 9–46)
AST: 55 U/L — ABNORMAL HIGH (ref 10–35)
Albumin: 4 g/dL (ref 3.6–5.1)
Alkaline phosphatase (APISO): 56 U/L (ref 35–144)
BUN: 19 mg/dL (ref 7–25)
CO2: 27 mmol/L (ref 20–32)
Calcium: 9.5 mg/dL (ref 8.6–10.3)
Chloride: 99 mmol/L (ref 98–110)
Creat: 0.97 mg/dL (ref 0.70–1.35)
Globulin: 3.5 g/dL (calc) (ref 1.9–3.7)
Glucose, Bld: 129 mg/dL — ABNORMAL HIGH (ref 65–99)
Potassium: 4.1 mmol/L (ref 3.5–5.3)
Sodium: 137 mmol/L (ref 135–146)
Total Bilirubin: 0.8 mg/dL (ref 0.2–1.2)
Total Protein: 7.5 g/dL (ref 6.1–8.1)
eGFR: 87 mL/min/{1.73_m2} (ref >=60)

## 2022-06-21 LAB — HEMOGLOBIN A1C
Hgb A1c MFr Bld: 7 % of total Hgb — ABNORMAL HIGH (ref <5.7)
Mean Plasma Glucose: 154 mg/dL
eAG (mmol/L): 8.5 mmol/L

## 2022-06-21 LAB — LIPID PANEL
Cholesterol: 193 mg/dL (ref <200)
HDL: 34 mg/dL — ABNORMAL LOW (ref >=40)
LDL Cholesterol (Calc): 133 mg/dL (calc) — ABNORMAL HIGH
Non-HDL Cholesterol (Calc): 159 mg/dL (calc) — ABNORMAL HIGH (ref <130)
Total CHOL/HDL Ratio: 5.7 (calc) — ABNORMAL HIGH (ref <5.0)
Triglycerides: 149 mg/dL (ref <150)

## 2022-06-23 ENCOUNTER — Ambulatory Visit: Payer: Medicare Other | Admitting: Nurse Practitioner

## 2022-08-06 ENCOUNTER — Other Ambulatory Visit: Payer: Self-pay | Admitting: Internal Medicine

## 2022-09-08 ENCOUNTER — Encounter: Payer: Medicare Other | Admitting: Internal Medicine

## 2022-10-05 ENCOUNTER — Other Ambulatory Visit: Payer: Self-pay

## 2022-10-05 MED ORDER — ALLOPURINOL 300 MG PO TABS: ORAL_TABLET | ORAL | 3 refills | Status: DC

## 2022-10-06 ENCOUNTER — Other Ambulatory Visit: Payer: Self-pay

## 2022-10-06 MED ORDER — ALLOPURINOL 300 MG PO TABS: ORAL_TABLET | ORAL | 3 refills | Status: DC

## 2022-10-31 ENCOUNTER — Encounter: Payer: Self-pay | Admitting: Internal Medicine

## 2022-10-31 NOTE — Progress Notes (Unsigned)
Future Appointments  Date Time Provider Department  11/01/2022                         cpe 10:00 AM Lucky Cowboy, MD [facility]  11/16/2023                         cpe 10:00 AM Lucky Cowboy, MD [facility]  Nov 2023 - Welcome to [facility]  Annual  Screening/Preventative Visit  & Comprehensive Evaluation & Examination      This very nice 67 y.o.  Single WM presents for a Screening /Preventative Visit & comprehensive evaluation and management of multiple medical co-morbidities.  Patient has been followed for HTN, HLD, T2_NIDDM  and Vitamin D Deficiency. Patient has hx/o Gout controlled on Allopurinol.  Patient has hx/o Aortic Atherosclerosis by CT scan in 2021.        Patient is on Prednisone for  cutaneous Lupus & is followed at [facility]. In Dec 2019, he had extensive NEGATIVE labs for Connective Tissue Disease.  He also is on Gabapentin for painful Peripheral Neuropathy     HTN predates circa 2004 & treatment started in 2008. Patient's BP has been controlled at home.  Today's BP is at goal -  130/80 .  In 2022, patient ha (+) cardiac calcium score which prompted a Lexiscan which was negative.  Patient denies any cardiac symptoms as chest pain, palpitations, shortness of breath, dizziness or ankle swelling.     Patient's hyperlipidemia is controlled with diet and RYRE &  Ezetamibe.  Patient has been intolerant to Statins.  Patient denies myalgias or other medication SE's. Last lipids were at goal:  Lab Results  Component Value Date   CHOL 193 06/20/2022   HDL 34 (L) 06/20/2022   LDLCALC 133 (H) 06/20/2022   TRIG 149 06/20/2022   CHOLHDL 5.7 (H) 06/20/2022        Patient has moderate obesity (BMI 36.9+) and consequent  prediabetes (A1c 5.9% / 2011 & 6.0% / 2016) and then T2_NIDDM (A1c  6.7%  /Apr 2020)  which he's attempting to control with diet.  Patient denies reactive hypoglycemic symptoms, visual blurring, diabetic polys or paresthesias. Last A1c was not at goal:  Lab Results   Component Value Date   HGBA1C 7.0 (H) 06/20/2022        Finally, patient has history of Vitamin D Deficiency ("42" / 2008)  and last vitamin D was at goal:  Lab Results  Component Value Date   VD25OH 109 (H) 09/03/2021    Current Outpatient Medications on File Prior to Visit  Medication Sig   allopurinol (ZYLOPRIM) 300 MG tablet TAKE 1 TABLET DAILY TO PREVENT GOUT.   ALPRAZolam (XANAX) 1 MG tablet Take 1/2 - 1 tablet 2 - 3 x /day ONLY if needed for Anxiety Attack & limit to 5 days /week to avoid Addiction & Dementia   aspirin EC 81 MG tablet Take 81 mg by mouth daily.    atenolol (TENORMIN) 100 MG tablet TAKE ONE TABLET DAILY FOR FOR BLOOD PRESSURE   Cholecalciferol (VITAMIN D3) 125 MCG (5000 UT) CAPS Take 1 capsule (5,000 Units total) by mouth daily.   clobetasol cream (TEMOVATE) 0.05 % Apply twice daily to affected areas of the body for psoriasis. Never to the face.   ezetimibe (ZETIA) 10 MG tablet Take 1 tablet Daily for Cholesterol   gabapentin (NEURONTIN) 300 MG capsule Take 1 caps three times a  day as needed for neuropathy pain.   hydrOXYzine  25 MG tablet TAKE 2 TABLETS THREE TIMES DAILY AS NEEDED FOR ITCHING.   lisinopril-hctz  20-25 MG tablet Take 1 tablet Daily    Magnesium 400 MG TABS Take 1 tablet  daily.   MILK THISTLE PO Take 1 tablet  daily.   Multiple Vitamin (MULTIVITAMIN) capsule Take 1 capsule daily.   Omega-3 Fatty Acids (FISH OIL PO) Take 1 capsule daily.   predniSONE  5 MG tablet Take 1-2 tabs daily as directed by doctor for lupus.   Probiotic Take 1 capsule  daily.   Red Yeast Rice Extract (RED YEAST RICE PO) Take 1,200 mg  daily.   triamcinolone cream (KENALOG) 0.1 % Apply 1 application topically 3 (three) times daily.   TURMERIC   2,000 mg Take  daily.    zinc 50 MG tablet Take daily.    Allergies  Allergen Reactions   Doxycycline     Lupus flair   Imuran [Azathioprine] Rash   Lipitor [Atorvastatin]     Myalgias    Penicillins     Causes lupus  flare ups   Rosuvastatin Diarrhea   Zocor [Simvastatin]     Myalgias   Past Medical History:  Diagnosis Date   Asthma    Gastritis, acute with hemorrhage 08/01/2011   GERD (gastroesophageal reflux disease)    Hypertension    Lupus ([name])    Morbid obesity, unspecified obesity type ([name]) 09/03/2020   Personal history of COVID-19 06/04/2019   Reported 05/20/2019   Prediabetes    Seasonal allergies    Vitamin D deficiency    Wrist fracture 01/17/2020   Health Maintenance  Topic Date Due   COVID-19 Vaccine (1) Never done   Zoster Vaccines- Shingrix (1 of 2) Never done   OPHTHALMOLOGY EXAM  06/27/2018   Colonoscopy  07/31/2021   Pneumonia Vaccine 106+ Years old (3 of 3 - PPSV23 or PCV20) 01/01/2022   DTaP/Tdap/Td (2 - Td or Tdap) 07/19/2022   Diabetic kidney evaluation - Urine ACR  09/04/2022   FOOT EXAM  09/04/2022   INFLUENZA VACCINE  12/08/2022   HEMOGLOBIN A1C  12/19/2022   Medicare Annual Wellness (AWV)  03/17/2023   Diabetic kidney evaluation - eGFR measurement  06/21/2023   Hepatitis C Screening  Completed   HIV Screening  Completed   HPV VACCINES  Aged Out   Immunization History  Administered Date(s) Administered   Influenza Inj Mdck Quad With Preservative 03/04/2019, 03/11/2020   Influenza Split 02/26/2013, 02/09/2015   Influenza, Quadrivalent, Recombinant, Inj, Pf 03/14/2016, 02/08/2017, 01/31/2018   PPD Test 07/22/2013, 07/23/2014, 07/24/2015, 08/14/2017, 08/27/2018, 09/04/2019   Pneumococcal Conjugate-13 03/14/2016   Pneumococcal Polysaccharide-23 05/22/2008   Tdap 07/18/2012   Last Colon - 08/01/2011 - Dr Jarold Motto - recc 10 yr f/u due Apr 2023  Past Surgical History:  Procedure Laterality Date   AXILLARY LYMPH NODE BIOPSY Right 08/27/2012   Procedure: AXILLARY LYMPH NODE BIOPSY ;  Surgeon: Emelia Loron, MD;  Location: [facility];  Service: General;  Laterality: Right;   CHOLECYSTECTOMY N/A 12/28/2012   Procedure: LAPAROSCOPIC CHOLECYSTECTOMY WITH  INTRAOPERATIVE CHOLANGIOGRAM;  Surgeon: Clovis Pu. Cornett, MD;  Location: [facility];  Service: General;  Laterality: N/A;   ORIF WRIST FRACTURE Left 01/17/2020   Procedure: OPEN REDUCTION INTERNAL FIXATION (ORIF) WRIST FRACTURE;  Surgeon: Dominica Severin, MD;  Location: [facility];  Service: Orthopedics;  Laterality: Left;   TONSILLECTOMY     WISDOM TOOTH EXTRACTION     Family  History  Problem Relation Age of Onset   Diabetes Mother    Heart disease Mother    Alcoholism Father    Colon polyps Neg Hx    Colon cancer Neg Hx    Social History   Socioeconomic History   Marital status: Single  Occupational History   Occupation:  Adult nurse AND [employer]  Tobacco Use   Smoking status: Never Smoker   Smokeless tobacco: Never Used  Substance and Sexual Activity   Alcohol use: Yes    Comment: occasional   Drug use: No   Sexual activity: Not on file     ROS Constitutional: Denies fever, chills, weight loss/gain, headaches, insomnia,  night sweats or change in appetite. Does c/o fatigue. Eyes: Denies redness, blurred vision, diplopia, discharge, itchy or watery eyes.  ENT: Denies discharge, congestion, post nasal drip, epistaxis, sore throat, earache, hearing loss, dental pain, Tinnitus, Vertigo, Sinus pain or snoring.  Cardio: Denies chest pain, palpitations, irregular heartbeat, syncope, dyspnea, diaphoresis, orthopnea, PND, claudication or edema Respiratory: denies cough, dyspnea, DOE, pleurisy, hoarseness, laryngitis or wheezing.  Gastrointestinal: Denies dysphagia, heartburn, reflux, water brash, pain, cramps, nausea, vomiting, bloating, diarrhea, constipation, hematemesis, melena, hematochezia, jaundice or hemorrhoids Genitourinary: Denies dysuria, frequency, urgency, nocturia, hesitancy, discharge, hematuria or flank pain Musculoskeletal: Denies arthralgia, myalgia, stiffness, Jt. Swelling, pain, limp or strain/sprain. Denies Falls. Skin: Denies puritis, rash, hives, warts, acne,  eczema or change in skin lesion Neuro: No weakness, tremor, incoordination, spasms, paresthesia or pain Psychiatric: Denies confusion, memory loss or sensory loss. Denies Depression. Endocrine: Denies change in weight, skin, hair change, nocturia, and paresthesia, diabetic polys, visual blurring or hyper / hypo glycemic episodes.  Heme/Lymph: No excessive bleeding, bruising or enlarged lymph nodes.  Physical Exam  BP 130/80   Pulse 66   Temp 97.9 F (36.6 C)   Resp 16   Ht 5' 10.5" (1.791 m)   Wt 246 lb 3.2 oz (111.7 kg)   SpO2 98%   BMI 34.83 kg/m   General Appearance: Well nourished and well groomed and in no apparent distress.  Eyes: PERRLA, EOMs, conjunctiva no swelling or erythema, normal fundi and vessels. Sinuses: No frontal/maxillary tenderness ENT/Mouth: EACs patent / TMs  nl. Nares clear without erythema, swelling, mucoid exudates. Oral hygiene is good. No erythema, swelling, or exudate. Tongue normal, non-obstructing. Tonsils not swollen or erythematous. Hearing normal.  Neck: Supple, thyroid not palpable. No bruits, nodes or JVD. Respiratory: Respiratory effort normal.  BS equal and clear bilateral without rales, rhonci, wheezing or stridor. Cardio: Heart sounds are normal with regular rate and rhythm and no murmurs, rubs or gallops. Peripheral pulses are normal and equal bilaterally without edema. No aortic or femoral bruits. Chest: symmetric with normal excursions and percussion.  Abdomen: Soft, with Nl bowel sounds. Nontender, no guarding, rebound, hernias, masses, or organomegaly.  Lymphatics: Non tender without lymphadenopathy.  Musculoskeletal: Full ROM all peripheral extremities, joint stability, 5/5 strength, and normal gait. Skin: Warm and dry without rashes, lesions, cyanosis, clubbing or  ecchymosis.  Neuro: Cranial nerves intact, reflexes equal bilaterally. Normal muscle tone, no cerebellar symptoms. Sensation intact.  Pysch: Alert and oriented X 3 with normal  affect, insight and judgment appropriate.   Assessment and Plan  1. Annual Preventative/Screening Exam    2. Essential hypertension  - EKG 12-Lead - Urinalysis, Routine w reflex microscopic - Microalbumin / creatinine urine ratio - CBC with Differential/Platelet - COMPLETE METABOLIC PANEL WITH GFR - Magnesium - TSH - [place], RETROPERITNL ABD,  LTD  3. Hyperlipidemia associated with type 2 diabetes mellitus ([name])  - EKG 12-Lead - Lipid panel - TSH - [place], RETROPERITNL ABD,  LTD   4. Type 2 diabetes mellitus with stage 2 chronic kidney                                             disease, without long-term current use of insulin ([name])  - Urinalysis, Routine w reflex microscopic - Microalbumin / creatinine urine ratio - HM DIABETES FOOT EXAM - PR LOW EXTEMITY NEUR EXAM DOCUM - Hemoglobin A1c - Insulin, random - [place], RETROPERITNL ABD,  LTD   5. Vitamin D deficiency  - VITAMIN D 25 Hydroxyl   6. Idiopathic gout  - Uric acid   7. Cutaneous lupus erythematosus   8. Aortic atherosclerosis ([name])- CT 01/2020  - EKG 12-Lead - Lipid panel - [place], RETROPERITNL ABD,  LTD   9. Benign localized prostatic hyperplasia with lower urinary tract symptoms (LUTS)  - PSA   10. Screening for colorectal cancer  - POC Hemoccult Bld/Stl    11. Ascending aorta dilatation ([name]) - 4.1 cm 10/2020  - [place], RETROPERITNL ABD,  LTD   12. 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 Screening for heart disease  - EKG 12-Lead   15. FHx: heart disease  - EKG 12-Lead - [place], RETROPERITNL ABD,  LTD   16. Screening for AAA (aortic abdominal aneurysm)  - [place], RETROPERITNL ABD,  LTD   17. Medication management  - Urinalysis, Routine w reflex microscopic - Microalbumin / creatinine urine ratio - CBC with Differential/Platelet - COMPLETE METABOLIC PANEL WITH GFR - Magnesium - Lipid panel - TSH - Hemoglobin A1c - Insulin, random - VITAMIN  D 25 Hydroxy - Uric acid           Patient was counseled in prudent diet, weight control to achieve/maintain BMI less than 25, BP monitoring, regular exercise and medications as discussed.  Discussed med effects and SE's. Routine screening labs and tests as requested with regular follow-up as recommended. Over 40 minutes of exam, counseling, chart review and high complex critical decision making was performed   Marinus Maw, MD

## 2022-10-31 NOTE — Patient Instructions (Signed)

## 2022-11-01 ENCOUNTER — Ambulatory Visit (INDEPENDENT_AMBULATORY_CARE_PROVIDER_SITE_OTHER): Payer: Medicare Other | Admitting: Internal Medicine

## 2022-11-01 ENCOUNTER — Encounter: Payer: Self-pay | Admitting: Internal Medicine

## 2022-11-01 VITALS — BP 130/80 | HR 66 | Temp 97.9°F | Resp 16 | Ht 70.5 in | Wt 246.2 lb

## 2022-11-01 DIAGNOSIS — I10 Essential (primary) hypertension: Secondary | ICD-10-CM

## 2022-11-01 DIAGNOSIS — Z1211 Encounter for screening for malignant neoplasm of colon: Secondary | ICD-10-CM

## 2022-11-01 DIAGNOSIS — Z136 Encounter for screening for cardiovascular disorders: Secondary | ICD-10-CM

## 2022-11-01 DIAGNOSIS — Z8249 Family history of ischemic heart disease and other diseases of the circulatory system: Secondary | ICD-10-CM

## 2022-11-01 DIAGNOSIS — E559 Vitamin D deficiency, unspecified: Secondary | ICD-10-CM

## 2022-11-01 DIAGNOSIS — N401 Enlarged prostate with lower urinary tract symptoms: Secondary | ICD-10-CM

## 2022-11-01 DIAGNOSIS — M1000 Idiopathic gout, unspecified site: Secondary | ICD-10-CM

## 2022-11-01 DIAGNOSIS — N182 Chronic kidney disease, stage 2 (mild): Secondary | ICD-10-CM

## 2022-11-01 DIAGNOSIS — I77810 Thoracic aortic ectasia: Secondary | ICD-10-CM

## 2022-11-01 DIAGNOSIS — Z125 Encounter for screening for malignant neoplasm of prostate: Secondary | ICD-10-CM

## 2022-11-01 DIAGNOSIS — L932 Other local lupus erythematosus: Secondary | ICD-10-CM

## 2022-11-01 DIAGNOSIS — Z0001 Encounter for general adult medical examination with abnormal findings: Secondary | ICD-10-CM

## 2022-11-01 DIAGNOSIS — Z79899 Other long term (current) drug therapy: Secondary | ICD-10-CM

## 2022-11-01 DIAGNOSIS — E1169 Type 2 diabetes mellitus with other specified complication: Secondary | ICD-10-CM

## 2022-11-01 DIAGNOSIS — I700 Atherosclerosis of aorta: Secondary | ICD-10-CM

## 2022-11-01 DIAGNOSIS — T466X5A Adverse effect of antihyperlipidemic and antiarteriosclerotic drugs, initial encounter: Secondary | ICD-10-CM

## 2022-11-01 LAB — CBC WITH DIFFERENTIAL/PLATELET
Basophils Relative: 0.5 %
HCT: 42.9 % (ref 38.5–50.0)
MCHC: 33.6 g/dL (ref 32.0–36.0)
Neutro Abs: 6482 cells/uL (ref 1500–7800)
RBC: 4.41 10*6/uL (ref 4.20–5.80)
RDW: 12.6 % (ref 11.0–15.0)
Total Lymphocyte: 17.7 %
WBC: 8.7 10*3/uL (ref 3.8–10.8)

## 2022-11-02 LAB — CBC WITH DIFFERENTIAL/PLATELET
Absolute Monocytes: 583 cells/uL (ref 200–950)
Basophils Absolute: 44 cells/uL (ref 0–200)
Eosinophils Absolute: 52 cells/uL (ref 15–500)
Eosinophils Relative: 0.6 %
Hemoglobin: 14.4 g/dL (ref 13.2–17.1)
Lymphs Abs: 1540 cells/uL (ref 850–3900)
MCH: 32.7 pg (ref 27.0–33.0)
MCV: 97.3 fL (ref 80.0–100.0)
MPV: 10.5 fL (ref 7.5–12.5)
Monocytes Relative: 6.7 %
Neutrophils Relative %: 74.5 %
Platelets: 265 10*3/uL (ref 140–400)

## 2022-11-02 LAB — MICROALBUMIN / CREATININE URINE RATIO
Creatinine, Urine: 48 mg/dL (ref 20–320)
Microalb Creat Ratio: 42 mg/g creat — ABNORMAL HIGH (ref <30)
Microalb, Ur: 2 mg/dL

## 2022-11-02 LAB — COMPLETE METABOLIC PANEL WITH GFR
AG Ratio: 1.3 (calc) (ref 1.0–2.5)
ALT: 42 U/L (ref 9–46)
AST: 55 U/L — ABNORMAL HIGH (ref 10–35)
Albumin: 4.2 g/dL (ref 3.6–5.1)
Alkaline phosphatase (APISO): 57 U/L (ref 35–144)
BUN: 16 mg/dL (ref 7–25)
CO2: 30 mmol/L (ref 20–32)
Calcium: 9.6 mg/dL (ref 8.6–10.3)
Chloride: 100 mmol/L (ref 98–110)
Creat: 0.96 mg/dL (ref 0.70–1.35)
Globulin: 3.3 g/dL (calc) (ref 1.9–3.7)
Glucose, Bld: 119 mg/dL — ABNORMAL HIGH (ref 65–99)
Potassium: 4.7 mmol/L (ref 3.5–5.3)
Sodium: 137 mmol/L (ref 135–146)
Total Bilirubin: 0.7 mg/dL (ref 0.2–1.2)
Total Protein: 7.5 g/dL (ref 6.1–8.1)
eGFR: 88 mL/min/{1.73_m2} (ref >=60)

## 2022-11-02 LAB — LIPID PANEL
Cholesterol: 188 mg/dL (ref <200)
HDL: 39 mg/dL — ABNORMAL LOW (ref >=40)
LDL Cholesterol (Calc): 126 mg/dL (calc) — ABNORMAL HIGH
Non-HDL Cholesterol (Calc): 149 mg/dL (calc) — ABNORMAL HIGH (ref <130)
Total CHOL/HDL Ratio: 4.8 (calc) (ref <5.0)
Triglycerides: 119 mg/dL (ref <150)

## 2022-11-02 LAB — TSH: TSH: 2.58 mIU/L (ref 0.40–4.50)

## 2022-11-02 LAB — HEMOGLOBIN A1C
Hgb A1c MFr Bld: 6.5 % of total Hgb — ABNORMAL HIGH (ref <5.7)
Mean Plasma Glucose: 140 mg/dL
eAG (mmol/L): 7.7 mmol/L

## 2022-11-02 LAB — PSA: PSA: 0.37 ng/mL (ref <=4.00)

## 2022-11-02 LAB — URIC ACID: Uric Acid, Serum: 4.7 mg/dL (ref 4.0–8.0)

## 2022-11-02 LAB — URINALYSIS, ROUTINE W REFLEX MICROSCOPIC
Bilirubin Urine: NEGATIVE
Glucose, UA: NEGATIVE
Hgb urine dipstick: NEGATIVE
Ketones, ur: NEGATIVE
Leukocytes,Ua: NEGATIVE
Nitrite: NEGATIVE
Protein, ur: NEGATIVE
Specific Gravity, Urine: 1.009 (ref 1.001–1.035)
pH: 6.5 (ref 5.0–8.0)

## 2022-11-02 LAB — INSULIN, RANDOM: Insulin: 29.1 u[IU]/mL — ABNORMAL HIGH

## 2022-11-02 LAB — MAGNESIUM: Magnesium: 1.9 mg/dL (ref 1.5–2.5)

## 2022-11-02 LAB — VITAMIN D 25 HYDROXY (VIT D DEFICIENCY, FRACTURES): Vit D, 25-Hydroxy: 64 ng/mL (ref 30–100)

## 2022-11-02 NOTE — Progress Notes (Signed)
^<^<^<^<^<^<^<^<^<^<^<^<^<^<^<^<^<^<^<^<^<^<^<^<^<^<^<^<^<^<^<^<^<^<^<^<^ ^>^>^>^>^>^>^>^>^>^>^>>^>^>^>^>^>^>^>^>^>^>^>^>^>^>^>^>^>^>^>^>^>^>^>^>^>  -Test results slightly outside the reference range are not unusual. If there is anything important, I will review this with you,  otherwise it is considered normal test values.  If you have further questions,  please do not hesitate to contact me at the office or via My Chart.   ^<^<^<^<^<^<^<^<^<^<^<^<^<^<^<^<^<^<^<^<^<^<^<^<^<^<^<^<^<^<^<^<^<^<^<^<^ ^>^>^>^>^>^>^>^>^>^>^>^>^>^>^>^>^>^>^>^>^>^>^>^>^>^>^>^>^>^>^>^>^>^>^>^>^  -  Total  Chol =    188    -  slightly Elevated             (  Ideal  or  Goal is less than 180  !  )  & -  Bad / Dangerous LDL  Chol =  126  - is very Elevated !             (  Ideal  or  Goal is less than 70  !  )    - Recommend a much STRICTER low cholesterol diet   - Cholesterol only comes from animal sources                                                                        - ie. meat, dairy, egg yolks  - Eat all the vegetables you want.  - Avoid Meat, Avoid Meat,  Avoid Meat                                                                 - especially Red Meat - Beef AND Pork .  - Avoid cheese & dairy - milk & ice cream.     - Cheese is the most concentrated form of trans-fats which                                                                            is the worst thing to clog up our arteries.    - Veggie cheese is OK which can be found in the fresh                                        produce section at [employer] or [employer] or [employer] ^>^>^>^>^>^>^>^>^>^>^>^>^>^>^>^>^>^>^>^>^>^>^>^>^>^>^>^>^>^>^>^>^>^>^>^>^ ^>^>^>^>^>^>^>^>^>^>^>^>^>^>^>^>^>^>^>^>^>^>^>^>^>^>^>^>^>^>^>^>^>^>^>^>^  - A1c = 6.5% - Still in Diabetic Range & too high    ( Ideal or goal is less than 5.5 %  )    ^>^>^>^>^>^>^>^>^>^>^>^>^>^>^>^>^>^>^>^>^>^>^>^>^>^>^>^>^>^>^>^>^>^>^>^>^ ^>^>^>^>^>^>^>^>^>^>^>^>^>^>^>^>^>^>^>^>^>^>^>^>^>^>^>^>^>^>^>^>^>^>^>^>^  -  PSA - very Low - No Prostate cancer  - Great  !   ^>^>^>^>^>^>^>^>^>^>^>^>^>^>^>^>^>^>^>^>^>^>^>^>^>^>^>^>^>^>^>^>^>^>^>^>^ ^>^>^>^>^>^>^>^>^>^>^>^>^>^>^>^>^>^>^>^>^>^>^>^>^>^>^>^>^>^>^>^>^>^>^>^>^  -  Vitamin D = 35 -Great  !   - Please keep dosage same   ^>^>^>^>^>^>^>^>^>^>^>^>^>^>^>^>^>^>^>^>^>^>^>^>^>^>^>^>^>^>^>^>^>^>^>^>^ ^>^>^>^>^>^>^>^>^>^>^>^>^>^>^>^>^>^>^>^>^>^>^>^>^>^>^>^>^>^>^>^>^>^>^>^>^  -  Uric acid / Gout test is Normal - continue Allopurinol  ^>^>^>^>^>^>^>^>^>^>^>^>^>^>^>^>^>^>^>^>^>^>^>^>^>^>^>^>^>^>^>^>^>^>^>^>^ ^>^>^>^>^>^>^>^>^>^>^>^>^>^>^>^>^>^>^>^>^>^>^>^>^>^>^>^>^>^>^>^>^>^>^>^>^  -  All Else - CBC - Kidneys - Electrolytes - Liver - Magnesium & Thyroid    - all  Normal / OK  ^>^>^>^>^>^>^>^>^>^>^>^>^>^>^>^>^>^>^>^>^>^>^>^>^>^>^>^>^>^>^>^>^>^>^>^>^ ^>^>^>^>^>^>^>^>^>^>^>^>^>^>^>^>^>^>^>^>^>^>^>^>^>^>^>^>^>^>^>^>^>^>^>^>^

## 2022-11-03 ENCOUNTER — Other Ambulatory Visit: Payer: Self-pay | Admitting: Internal Medicine

## 2022-11-03 ENCOUNTER — Other Ambulatory Visit: Payer: Self-pay

## 2022-11-03 MED ORDER — ALLOPURINOL 300 MG PO TABS: ORAL_TABLET | ORAL | 3 refills | Status: AC

## 2023-01-18 ENCOUNTER — Other Ambulatory Visit: Payer: Self-pay | Admitting: Nurse Practitioner

## 2023-03-13 NOTE — Progress Notes (Signed)
Future Appointments  Date Time Provider Department  03/14/2023                      3 mo  9:30 AM Lucky Cowboy, MD [facility]  06/14/2023                        6 mo -wellness  9:30 AM Adela Glimpse, NP [facility]  11/16/2023                      cpe 10:00 AM Lucky Cowboy, MD [facility]    History of Present Illness:      This very nice 67 y.o. single WM  presents for 3 month follow up with HTN, HLD, T2_NIDDM and Vitamin D Deficiency. Patient has hx/o Gout controlled on Allopurinol.  Patient has hx/o Aortic Atherosclerosis by CT scan in 2021.         Patient has SLE with cutaneous involvement followed at [facility] by Rheumatologist Dr Maurine Minister Ang who apparently plans  to try Mason on Belimumab in hopes of weaning him off of his daily steroid dosing.         Patient is treated for HTN (2004) & BP has been controlled at home. Today's BP is at goal - 136/76 . Patient has had no complaints of any cardiac type chest pain, palpitations, dyspnea Pollyann Kennedy /PND, dizziness, claudication or dependent edema.        Patient is Statin intolerant & Hyperlipidemia is not controlled with diet & ezetimibe. Patient denies myalgias or other med SE's. Last Lipids were not at goal :  Lab Results  Component Value Date   CHOL 188 11/01/2022   HDL 39 (L) 11/01/2022   LDLCALC 126 (H) 11/01/2022   TRIG 119 11/01/2022   CHOLHDL 4.8 11/01/2022     Also, the patient has history of PreDiabetes  (A1c 5.9% /2011 & 6.0% /2016) and then T2_NIDDM (A1c  6.7%  /2020) and  been attempting diet control. Also, patient  has had no symptoms of reactive hypoglycemia, diabetic polys, paresthesias or visual blurring.  Last A1c was not at goal :  Lab Results  Component Value Date   HGBA1C 6.5 (H) 11/01/2022                                                          Further, the patient also has history of Vitamin D Deficiency and supplements vitamin D without any suspected  side-effects. Last vitamin D was at goal :   Lab Results  Component Value Date   VD25OH 64 11/01/2022       Current Outpatient Medications on File Prior to Visit  Medication Sig   allopurinol 300 MG TAKE 1 TABLET DAILY     aspirin EC 81 MG tablet Take  daily.    atenolol  100 MG tablet Take 1/2  tablet Daily for BP    VITAMIN D 5000 U Take 10,000 Units by mouth daily.    ezetimibe  10 MG tablet TAKE 1 TABLET DAILY    gabapentin 100 MG capsule Take 1 capsule 2 to 3 x /day as needed    hydrOXYzine 25 MG tablet TAKE 2 TABLETS 3 x /day as needed -itching  lisinopril-hctz 20-25 MG tablet TAKE 1 TABLET ONCE DAILY    Magnesium 400 MG TABS Take 1 tablet  daily.   methocarbamol 500 MG tablet Take 1 tablet  4  times daily.   MILK THISTLE  Take 1 tablet  daily.   Multiple Vitamin  Take 1 capsule  daily.   Omega-3 FISH OIL Take 1 capsule  daily.   ondansetron  4 MG tablet Take 1 tablet  daily as needed    Alphalophoric Acid  Takes 1 capsule daily   predniSONE 10 MG tablet TAKE 1 TABLETS DAILY    Probiotic  Take 1 capsule b daily.   triamcinolone cream  0.1 % Apply  3  times daily.    TURMERIC PO Take 2,000 mg  daily.     Allergies  Allergen Reactions   Doxycycline     Lupus flair   Imuran [Azathioprine] Rash   Lipitor [Atorvastatin]     Myalgias    Penicillins     Causes lupus flare ups   Rosuvastatin Diarrhea   Zocor [Simvastatin]     Myalgias    PMHx:   Past Medical History:  Diagnosis Date   Asthma    GERD     Hypertension    Lupus ([name])    Prediabetes    Seasonal allergies    Vitamin D deficiency     Immunization History  Administered Date(s) Administered   Influenza Inj Mdck Quad With Preservative 03/04/2019   Influenza Split 02/26/2013, 02/09/2015   PPD Test 07/22/2013, 07/23/2014, 07/24/2015, 08/14/2017, 08/27/2018, 09/04/2019   Pneumococcal Polysaccharide-23 05/22/2008   Tdap 07/18/2012    Past Surgical History:  Procedure Laterality Date   AXILLARY  LYMPH NODE BIOPSY Right 08/27/2012   Procedure: AXILLARY LYMPH NODE BIOPSY ;  Surgeon: Emelia Loron, MD;  Location: [facility];  Service: General;  Laterality: Right;   CHOLECYSTECTOMY N/A 12/28/2012   Procedure: LAPAROSCOPIC CHOLECYSTECTOMY WITH INTRAOPERATIVE CHOLANGIOGRAM;  Surgeon: Clovis Pu. Cornett, MD;  Location: [facility];  Service: General;  Laterality: N/A;   ORIF WRIST FRACTURE Left 01/17/2020   Procedure: OPEN REDUCTION INTERNAL FIXATION (ORIF) WRIST FRACTURE;  Surgeon: Dominica Severin, MD;  Location: [facility];  Service: Orthopedics;  Laterality: Left;   TONSILLECTOMY     WISDOM TOOTH EXTRACTION      FHx:    Reviewed / unchanged  SHx:    Reviewed / unchanged   Systems Review:  Constitutional: Denies fever, chills, wt changes, headaches, insomnia, fatigue, night sweats, change in appetite. Eyes: Denies redness, blurred vision, diplopia, discharge, itchy, watery eyes.  ENT: Denies discharge, congestion, post nasal drip, epistaxis, sore throat, earache, hearing loss, dental pain, tinnitus, vertigo, sinus pain, snoring.  CV: Denies chest pain, palpitations, irregular heartbeat, syncope, dyspnea, diaphoresis, orthopnea, PND, claudication or edema. Respiratory: denies cough, dyspnea, DOE, pleurisy, hoarseness, laryngitis, wheezing.  Gastrointestinal: Denies dysphagia, odynophagia, heartburn, reflux, water brash, abdominal pain or cramps, nausea, vomiting, bloating, diarrhea, constipation, hematemesis, melena, hematochezia  or hemorrhoids. Genitourinary: Denies dysuria, frequency, urgency, nocturia, hesitancy, discharge, hematuria or flank pain. Musculoskeletal: Denies arthralgias, myalgias, stiffness, jt. swelling, pain, limping or strain/sprain.  Skin: Denies pruritus, rash, hives, warts, acne, eczema or change in skin lesion(s). Neuro: No weakness, tremor, incoordination, spasms, paresthesia or pain. Psychiatric: Denies confusion, memory loss or sensory loss. Endo: Denies change in weight,  skin or hair change.  Heme/Lymph: No excessive bleeding, bruising or enlarged lymph nodes.  Physical Exam  BP 136/76   Pulse 64   Temp 97.9 F (36.6 C)  Resp 16   Ht 5' 10.5" (1.791 m)   Wt 249 lb 3.2 oz (113 kg)   SpO2 99%   BMI 35.25 kg/m   Appears  well nourished, well groomed  and in no distress.  Eyes: PERRLA, EOMs, conjunctiva no swelling or erythema. Sinuses: No frontal/maxillary tenderness ENT/Mouth: EAC's clear, TM's nl w/o erythema, bulging. Nares clear w/o erythema, swelling, exudates. Oropharynx clear without erythema or exudates. Oral hygiene is good. Tongue normal, non obstructing. Hearing intact.  Neck: Supple. Thyroid not palpable. Car 2+/2+ without bruits, nodes or JVD. Chest: Respirations nl with BS clear & equal w/o rales, rhonchi, wheezing or stridor.  Cor: Heart sounds normal w/ regular rate and rhythm without sig. murmurs, gallops, clicks or rubs. Peripheral pulses normal and equal  without edema.  Abdomen: Soft & bowel sounds normal. Non-tender w/o guarding, rebound, hernias, masses or organomegaly.  Lymphatics: Unremarkable.  Musculoskeletal: Full ROM all peripheral extremities, joint stability, 5/5 strength and normal gait.  Skin: Warm, dry without exposed rashes, lesions or ecchymosis apparent.  Neuro: Cranial nerves intact, reflexes equal bilaterally. Sensory-motor testing grossly intact. Tendon reflexes grossly intact.  Pysch: Alert & oriented x 3.  Insight and judgement nl & appropriate. No ideations.  Assessment and Plan:   1. Essential hypertension  - Continue medication, monitor blood pressure at home.  - Continue DASH diet.  Reminder to go to the ER if any CP,  SOB, nausea, dizziness, severe HA, changes vision/speech.    - CBC with Differential/Platelet - COMPLETE METABOLIC PANEL WITH GFR - Magnesium - TSH   2. Statin intolerance  - Lipid panel   3. Hyperlipidemia associated with type 2 diabetes mellitus ([name])  - Continue  diet/meds, exercise,& lifestyle modifications.  - Continue monitor periodic cholesterol/liver & renal functions     - TSH - Lipid panel   4. Type 2 diabetes mellitus with stage 2 chronic kidney                                             disease, without long-term current use of insulin ([name])  - Continue diet, exercise  - Lifestyle modifications.  - Monitor appropriate labs.    - Hemoglobin A1c - Insulin, random   5. Aortic atherosclerosis ([name])- CT 01/2020  - Lipid panel   6. Vitamin D deficiency   - Continue supplementation.   - VITAMIN D 25 Hydroxy    7. Idiopathic gout  - Uric acid   8. Gastroesophageal reflux disease  - CBC with Differential/Platelet   9. Needs flu shot  - FLU VACCINE MDCK QUAD W/Preservative    10. Medication management  - CBC with Differential/Platelet - COMPLETE METABOLIC PANEL WITH GFR - Magnesium - TSH - Hemoglobin A1c - Insulin, random - VITAMIN D 25 Hydroxy - Lipid panel - Uric acid               Discussed  regular exercise, BP monitoring, weight control to achieve/maintain BMI less than 25 and discussed med and SE's. Recommended labs to assess and monitor clinical status with further disposition pending results of labs.  I discussed the assessment and treatment plan with the patient. The patient was provided an opportunity to ask questions and all were answered. The patient agreed with the plan and demonstrated an understanding of the instructions.  I provided over 30 minutes of exam, counseling, chart review and  complex critical decision making.   Marinus Maw, MD

## 2023-03-13 NOTE — Patient Instructions (Signed)

## 2023-03-13 NOTE — Progress Notes (Incomplete)
Future Appointments  Date Time Provider Department  03/14/2023                      3 mo  9:30 AM Lucky Cowboy, MD [facility]  06/14/2023                        6 mo -wellness  9:30 AM Adela Glimpse, NP [facility]  11/16/2023                      cpe 10:00 AM Lucky Cowboy, MD [facility]    History of Present Illness:      This very nice 67 y.o. single WM  presents for 3 month follow up with HTN, HLD, T2_NIDDM and Vitamin D Deficiency. Patient has hx/o Gout controlled on Allopurinol.  Patient has hx/o Aortic Atherosclerosis by CT scan in 2021.          Patient is treated for HTN (2004) & BP has been controlled at home. Today's BP is at goal -                    . Patient has had no complaints of any cardiac type chest pain, palpitations, dyspnea / orthopnea / PND, dizziness, claudication, or dependent edema.        Hyperlipidemia is not controlled with diet & ezetimibe. Patient denies myalgias or other med SE's. Last Lipids were not at goal :  Lab Results  Component Value Date   CHOL 188 11/01/2022   HDL 39 (L) 11/01/2022   LDLCALC 126 (H) 11/01/2022   TRIG 119 11/01/2022   CHOLHDL 4.8 11/01/2022     Also, the patient has history of PreDiabetes  (A1c 5.9% /2011 & 6.0% /2016) and then T2_NIDDM (A1c  6.7%  /2020) and  been attempting diet control. Also, patient  has had no symptoms of reactive hypoglycemia, diabetic polys, paresthesias or visual blurring.  Last A1c was not at goal :  Lab Results  Component Value Date   HGBA1C 6.5 (H) 11/01/2022                                                          Further, the patient also has history of Vitamin D Deficiency and supplements vitamin D without any suspected side-effects. Last vitamin D was at goal :   Lab Results  Component Value Date   VD25OH 64 11/01/2022       Current Outpatient Medications on File Prior to Visit  Medication Sig  . allopurinol 300 MG TAKE 1 TABLET DAILY   . ALPRAZolam 1 MG tablet TAKE  1/2-1 TAB 2-3 Tx /day as needed   . aspirin EC 81 MG tablet Take  daily.   Marland Kitchen atenolol  100 MG tablet Take 1/2  tablet Daily for BP   . VITAMIN D 5000 U Take 10,000 Units by mouth daily.   Marland Kitchen ezetimibe  10 MG tablet TAKE 1 TABLET DAILY   . gabapentin 100 MG capsule Take 1 capsule 2 to 3 x /day as needed   . hydrOXYzine 25 MG tablet TAKE 2 TABLETS 3 x /day as needed -itching  . lisinopril-hctz 20-25 MG tablet TAKE 1 TABLET ONCE DAILY   . Magnesium 400 MG TABS Take  1 tablet  daily.  . methocarbamol 500 MG tablet Take 1 tablet  4  times daily.  Marland Kitchen MILK THISTLE  Take 1 tablet  daily.  . Multiple Vitamin  Take 1 capsule  daily.  . Omega-3 FISH OIL Take 1 capsule  daily.  . ondansetron  4 MG tablet Take 1 tablet  daily as needed   . Alphalophoric Acid  Takes 1 capsule daily  . predniSONE 10 MG tablet TAKE 1 TABLETS DAILY   . Probiotic  Take 1 capsule b daily.  Marland Kitchen triamcinolone cream  0.1 % Apply  3  times daily.   . TURMERIC PO Take 2,000 mg  daily.     Allergies  Allergen Reactions  . Doxycycline     Lupus flair  . Imuran [Azathioprine] Rash  . Lipitor [Atorvastatin]     Myalgias   . Penicillins     Causes lupus flare ups  . Rosuvastatin Diarrhea  . Zocor [Simvastatin]     Myalgias    PMHx:   Past Medical History:  Diagnosis Date  . Asthma   . GERD    . Hypertension   . Lupus ([name])   . Prediabetes   . Seasonal allergies   . Vitamin D deficiency     Immunization History  Administered Date(s) Administered  . Influenza Inj Mdck Quad With Preservative 03/04/2019  . Influenza Split 02/26/2013, 02/09/2015  . PPD Test 07/22/2013, 07/23/2014, 07/24/2015, 08/14/2017, 08/27/2018, 09/04/2019  . Pneumococcal Polysaccharide-23 05/22/2008  . Tdap 07/18/2012    Past Surgical History:  Procedure Laterality Date  . AXILLARY LYMPH NODE BIOPSY Right 08/27/2012   Procedure: AXILLARY LYMPH NODE BIOPSY ;  Surgeon: Emelia Loron, MD;  Location: [facility];  Service: General;  Laterality:  Right;  . CHOLECYSTECTOMY N/A 12/28/2012   Procedure: LAPAROSCOPIC CHOLECYSTECTOMY WITH INTRAOPERATIVE CHOLANGIOGRAM;  Surgeon: Clovis Pu. Cornett, MD;  Location: [facility];  Service: General;  Laterality: N/A;  . ORIF WRIST FRACTURE Left 01/17/2020   Procedure: OPEN REDUCTION INTERNAL FIXATION (ORIF) WRIST FRACTURE;  Surgeon: Dominica Severin, MD;  Location: [facility];  Service: Orthopedics;  Laterality: Left;  . TONSILLECTOMY    . WISDOM TOOTH EXTRACTION      FHx:    Reviewed / unchanged  SHx:    Reviewed / unchanged   Systems Review:  Constitutional: Denies fever, chills, wt changes, headaches, insomnia, fatigue, night sweats, change in appetite. Eyes: Denies redness, blurred vision, diplopia, discharge, itchy, watery eyes.  ENT: Denies discharge, congestion, post nasal drip, epistaxis, sore throat, earache, hearing loss, dental pain, tinnitus, vertigo, sinus pain, snoring.  CV: Denies chest pain, palpitations, irregular heartbeat, syncope, dyspnea, diaphoresis, orthopnea, PND, claudication or edema. Respiratory: denies cough, dyspnea, DOE, pleurisy, hoarseness, laryngitis, wheezing.  Gastrointestinal: Denies dysphagia, odynophagia, heartburn, reflux, water brash, abdominal pain or cramps, nausea, vomiting, bloating, diarrhea, constipation, hematemesis, melena, hematochezia  or hemorrhoids. Genitourinary: Denies dysuria, frequency, urgency, nocturia, hesitancy, discharge, hematuria or flank pain. Musculoskeletal: Denies arthralgias, myalgias, stiffness, jt. swelling, pain, limping or strain/sprain.  Skin: Denies pruritus, rash, hives, warts, acne, eczema or change in skin lesion(s). Neuro: No weakness, tremor, incoordination, spasms, paresthesia or pain. Psychiatric: Denies confusion, memory loss or sensory loss. Endo: Denies change in weight, skin or hair change.  Heme/Lymph: No excessive bleeding, bruising or enlarged lymph nodes.  Physical Exam  There were no vitals taken for this  visit.  Appears  well nourished, well groomed  and in no distress.  Eyes: PERRLA, EOMs, conjunctiva no swelling or erythema. Sinuses: No  frontal/maxillary tenderness ENT/Mouth: EAC's clear, TM's nl w/o erythema, bulging. Nares clear w/o erythema, swelling, exudates. Oropharynx clear without erythema or exudates. Oral hygiene is good. Tongue normal, non obstructing. Hearing intact.  Neck: Supple. Thyroid not palpable. Car 2+/2+ without bruits, nodes or JVD. Chest: Respirations nl with BS clear & equal w/o rales, rhonchi, wheezing or stridor.  Cor: Heart sounds normal w/ regular rate and rhythm without sig. murmurs, gallops, clicks or rubs. Peripheral pulses normal and equal  without edema.  Abdomen: Soft & bowel sounds normal. Non-tender w/o guarding, rebound, hernias, masses or organomegaly.  Lymphatics: Unremarkable.  Musculoskeletal: Full ROM all peripheral extremities, joint stability, 5/5 strength and normal gait.  Skin: Warm, dry without exposed rashes, lesions or ecchymosis apparent.  Neuro: Cranial nerves intact, reflexes equal bilaterally. Sensory-motor testing grossly intact. Tendon reflexes grossly intact.  Pysch: Alert & oriented x 3.  Insight and judgement nl & appropriate. No ideations.  Assessment and Plan:  1. Essential hypertension  - Continue medication, monitor blood pressure at home.  - Continue DASH diet.  Reminder to go to the ER if any CP,  SOB, nausea, dizziness, severe HA, changes vision/speech.  - CBC with Differential/Platelet - COMPLETE METABOLIC PANEL WITH GFR - Magnesium - TSH - Urinalysis, Routine w reflex microscopic  2. Hyperlipidemia associated with type 2 diabetes mellitus ([name])  - Continue diet/meds, exercise,& lifestyle modifications.  - Continue monitor periodic cholesterol/liver & renal functions   - Lipid panel - TSH  3. Type 2 diabetes mellitus with stage 2 chronic kidney  disease, without long-term current use of insulin ([name])  -  Continue diet, exercise  - Lifestyle modifications.  - Monitor appropriate labs.  - Hemoglobin A1c - Insulin, random - Urinalysis, Routine w reflex microscopic  4. Vitamin D deficiency  - Continue supplementation.  - VITAMIN D 25 Hydroxy   5. Idiopathic gout  - Uric acid  6. Gastroesophageal reflux disease  - CBC with Differential/Platelet  7. Need for immunization against influenza  - FLU VACCINE MDCK QUAD W/Preservative  8. Medication management  - CBC with Differential/Platelet - COMPLETE METABOLIC PANEL WITH GFR - Magnesium - Lipid panel - TSH - Hemoglobin A1c - Insulin, random - VITAMIN D 25 Hydroxy  - Uric acid - Urinalysis, Routine w reflex microscopic             Discussed  regular exercise, BP monitoring, weight control to achieve/maintain BMI less than 25 and discussed med and SE's. Recommended labs to assess and monitor clinical status with further disposition pending results of labs.  I discussed the assessment and treatment plan with the patient. The patient was provided an opportunity to ask questions and all were answered. The patient agreed with the plan and demonstrated an understanding of the instructions.  I provided over 30 minutes of exam, counseling, chart review and  complex critical decision making.   Marinus Maw, MD

## 2023-03-14 ENCOUNTER — Encounter: Payer: Self-pay | Admitting: Internal Medicine

## 2023-03-14 ENCOUNTER — Ambulatory Visit (INDEPENDENT_AMBULATORY_CARE_PROVIDER_SITE_OTHER): Payer: Medicare Other | Admitting: Internal Medicine

## 2023-03-14 VITALS — BP 136/76 | HR 64 | Temp 97.9°F | Resp 16 | Ht 70.5 in | Wt 249.2 lb

## 2023-03-14 DIAGNOSIS — E1169 Type 2 diabetes mellitus with other specified complication: Secondary | ICD-10-CM

## 2023-03-14 DIAGNOSIS — M1000 Idiopathic gout, unspecified site: Secondary | ICD-10-CM

## 2023-03-14 DIAGNOSIS — Z789 Other specified health status: Secondary | ICD-10-CM

## 2023-03-14 DIAGNOSIS — I10 Essential (primary) hypertension: Secondary | ICD-10-CM | POA: Diagnosis not present

## 2023-03-14 DIAGNOSIS — Z23 Encounter for immunization: Secondary | ICD-10-CM | POA: Diagnosis not present

## 2023-03-14 DIAGNOSIS — I700 Atherosclerosis of aorta: Secondary | ICD-10-CM | POA: Diagnosis not present

## 2023-03-14 DIAGNOSIS — Z79899 Other long term (current) drug therapy: Secondary | ICD-10-CM

## 2023-03-14 DIAGNOSIS — E1122 Type 2 diabetes mellitus with diabetic chronic kidney disease: Secondary | ICD-10-CM

## 2023-03-14 DIAGNOSIS — K219 Gastro-esophageal reflux disease without esophagitis: Secondary | ICD-10-CM

## 2023-03-14 DIAGNOSIS — E559 Vitamin D deficiency, unspecified: Secondary | ICD-10-CM

## 2023-03-14 DIAGNOSIS — N182 Chronic kidney disease, stage 2 (mild): Secondary | ICD-10-CM

## 2023-03-14 DIAGNOSIS — M3219 Other organ or system involvement in systemic lupus erythematosus: Secondary | ICD-10-CM

## 2023-03-14 DIAGNOSIS — E785 Hyperlipidemia, unspecified: Secondary | ICD-10-CM

## 2023-03-14 MED ORDER — BUSPIRONE HCL 10 MG PO TABS
ORAL_TABLET | ORAL | 0 refills | Status: AC
Start: 2023-03-14 — End: ?

## 2023-03-15 LAB — CBC WITH DIFFERENTIAL/PLATELET
Absolute Lymphocytes: 1322 {cells}/uL (ref 850–3900)
Absolute Monocytes: 707 {cells}/uL (ref 200–950)
Basophils Absolute: 30 {cells}/uL (ref 0–200)
Basophils Relative: 0.4 %
Eosinophils Absolute: 152 {cells}/uL (ref 15–500)
Eosinophils Relative: 2 %
HCT: 42.2 % (ref 38.5–50.0)
Hemoglobin: 13.9 g/dL (ref 13.2–17.1)
MCH: 32.5 pg (ref 27.0–33.0)
MCHC: 32.9 g/dL (ref 32.0–36.0)
MCV: 98.6 fL (ref 80.0–100.0)
MPV: 10.5 fL (ref 7.5–12.5)
Monocytes Relative: 9.3 %
Neutro Abs: 5388 {cells}/uL (ref 1500–7800)
Neutrophils Relative %: 70.9 %
Platelets: 249 10*3/uL (ref 140–400)
RBC: 4.28 10*6/uL (ref 4.20–5.80)
RDW: 12.2 % (ref 11.0–15.0)
Total Lymphocyte: 17.4 %
WBC: 7.6 10*3/uL (ref 3.8–10.8)

## 2023-03-15 LAB — HEMOGLOBIN A1C
Hgb A1c MFr Bld: 6.5 %{Hb} — ABNORMAL HIGH (ref <5.7)
Mean Plasma Glucose: 140 mg/dL
eAG (mmol/L): 7.7 mmol/L

## 2023-03-15 LAB — LIPID PANEL
Cholesterol: 194 mg/dL (ref <200)
HDL: 35 mg/dL — ABNORMAL LOW (ref >=40)
LDL Cholesterol (Calc): 132 mg/dL — ABNORMAL HIGH
Non-HDL Cholesterol (Calc): 159 mg/dL — ABNORMAL HIGH (ref <130)
Total CHOL/HDL Ratio: 5.5 (calc) — ABNORMAL HIGH (ref <5.0)
Triglycerides: 153 mg/dL — ABNORMAL HIGH (ref <150)

## 2023-03-15 LAB — COMPLETE METABOLIC PANEL WITH GFR
AG Ratio: 1.2 (calc) (ref 1.0–2.5)
ALT: 37 U/L (ref 9–46)
AST: 44 U/L — ABNORMAL HIGH (ref 10–35)
Albumin: 4 g/dL (ref 3.6–5.1)
Alkaline phosphatase (APISO): 62 U/L (ref 35–144)
BUN: 13 mg/dL (ref 7–25)
CO2: 29 mmol/L (ref 20–32)
Calcium: 9.6 mg/dL (ref 8.6–10.3)
Chloride: 99 mmol/L (ref 98–110)
Creat: 0.99 mg/dL (ref 0.70–1.35)
Globulin: 3.4 g/dL (ref 1.9–3.7)
Glucose, Bld: 130 mg/dL — ABNORMAL HIGH (ref 65–99)
Potassium: 4.3 mmol/L (ref 3.5–5.3)
Sodium: 136 mmol/L (ref 135–146)
Total Bilirubin: 0.8 mg/dL (ref 0.2–1.2)
Total Protein: 7.4 g/dL (ref 6.1–8.1)
eGFR: 84 mL/min/{1.73_m2} (ref >=60)

## 2023-03-15 LAB — MICROSCOPIC MESSAGE

## 2023-03-15 LAB — URINALYSIS, ROUTINE W REFLEX MICROSCOPIC
Bacteria, UA: NONE SEEN /[HPF]
Bilirubin Urine: NEGATIVE
Glucose, UA: NEGATIVE
Hgb urine dipstick: NEGATIVE
Hyaline Cast: NONE SEEN /[LPF]
Ketones, ur: NEGATIVE
Leukocytes,Ua: NEGATIVE
Nitrite: NEGATIVE
Specific Gravity, Urine: 1.018 (ref 1.001–1.035)
Squamous Epithelial / HPF: NONE SEEN /[HPF] (ref <=5)
WBC, UA: NONE SEEN /[HPF] (ref 0–5)
pH: 7 (ref 5.0–8.0)

## 2023-03-15 LAB — MICROALBUMIN / CREATININE URINE RATIO
Creatinine, Urine: 169 mg/dL (ref 20–320)
Microalb Creat Ratio: 55 mg/g{creat} — ABNORMAL HIGH (ref <30)
Microalb, Ur: 9.3 mg/dL

## 2023-03-15 LAB — VITAMIN D 25 HYDROXY (VIT D DEFICIENCY, FRACTURES): Vit D, 25-Hydroxy: 65 ng/mL (ref 30–100)

## 2023-03-15 LAB — INSULIN, RANDOM: Insulin: 23.2 u[IU]/mL — ABNORMAL HIGH

## 2023-03-15 LAB — URIC ACID: Uric Acid, Serum: 6.3 mg/dL (ref 4.0–8.0)

## 2023-03-15 LAB — MAGNESIUM: Magnesium: 1.8 mg/dL (ref 1.5–2.5)

## 2023-03-15 LAB — TSH: TSH: 2.1 m[IU]/L (ref 0.40–4.50)

## 2023-03-31 ENCOUNTER — Other Ambulatory Visit: Payer: Self-pay | Admitting: Internal Medicine

## 2023-03-31 DIAGNOSIS — G63 Polyneuropathy in diseases classified elsewhere: Secondary | ICD-10-CM

## 2023-06-06 ENCOUNTER — Ambulatory Visit (INDEPENDENT_AMBULATORY_CARE_PROVIDER_SITE_OTHER): Payer: PPO | Admitting: Nurse Practitioner

## 2023-06-06 VITALS — BP 118/80 | HR 80 | Temp 97.9°F | Resp 17 | Ht 70.5 in | Wt 250.4 lb

## 2023-06-06 DIAGNOSIS — Z789 Other specified health status: Secondary | ICD-10-CM

## 2023-06-06 DIAGNOSIS — E1122 Type 2 diabetes mellitus with diabetic chronic kidney disease: Secondary | ICD-10-CM | POA: Diagnosis not present

## 2023-06-06 DIAGNOSIS — J069 Acute upper respiratory infection, unspecified: Secondary | ICD-10-CM

## 2023-06-06 DIAGNOSIS — I10 Essential (primary) hypertension: Secondary | ICD-10-CM | POA: Diagnosis not present

## 2023-06-06 DIAGNOSIS — N182 Chronic kidney disease, stage 2 (mild): Secondary | ICD-10-CM

## 2023-06-06 DIAGNOSIS — E785 Hyperlipidemia, unspecified: Secondary | ICD-10-CM

## 2023-06-06 DIAGNOSIS — E1169 Type 2 diabetes mellitus with other specified complication: Secondary | ICD-10-CM | POA: Diagnosis not present

## 2023-06-06 DIAGNOSIS — K7581 Nonalcoholic steatohepatitis (NASH): Secondary | ICD-10-CM

## 2023-06-06 DIAGNOSIS — Z79899 Other long term (current) drug therapy: Secondary | ICD-10-CM

## 2023-06-06 MED ORDER — PROMETHAZINE-DM 6.25-15 MG/5ML PO SYRP: 5.0000 mL | ORAL_SOLUTION | Freq: Four times a day (QID) | ORAL | 0 refills | Status: AC | PRN

## 2023-06-06 MED ORDER — PREDNISONE 10 MG PO TABS: ORAL_TABLET | ORAL | 0 refills | Status: AC

## 2023-06-06 NOTE — Progress Notes (Signed)
Assessment and Plan:  [facility] Fischman was seen today for a follow up:  Diagnoses and all order for this visit:  1. Essential hypertension (Primary) Discussed DASH (Dietary Approaches to Stop Hypertension) DASH diet is lower in sodium than a typical American diet. Cut back on foods that are high in saturated fat, cholesterol, and trans fats. Eat more whole-grain foods, fish, poultry, and nuts Remain active and exercise as tolerated daily.  Monitor BP at home-Call if greater than 130/80.  Check CMP/CBC  - CBC with Differential/Platelet - COMPLETE METABOLIC PANEL WITH GFR  2. Hyperlipidemia associated with type 2 diabetes mellitus ([name])/Statin intolerance Discussed lifestyle modifications. Recommended diet heavy in fruits and veggies, omega 3's. Decrease consumption of animal meats, cheeses, and dairy products. Remain active and exercise as tolerated. Continue to monitor. Check lipids/TSH  - Lipid panel  3. Type 2 diabetes mellitus with stage 2 chronic kidney disease, without long-term current use of insulin [facility]) Education: Reviewed 'ABCs' of diabetes management  Discussed goals to be met and/or maintained include A1C (<7) Blood pressure (<130/80) Cholesterol (LDL <70) Continue Eye Exam yearly  Continue Dental Exam Q6 mo Discussed dietary recommendations Discussed Physical Activity recommendations Foot exam UTD Check A1C  - Hemoglobin A1c  4. NASH (nonalcoholic steatohepatitis) Decrease/limit/avoid alcohol, acetaminophen and greasy foods. Monitor LFTs.  - COMPLETE METABOLIC PANEL WITH GFR  5. Upper respiratory tract infection, unspecified type Start tmt with abx and steroid - take in full and as directed to reduce abx resistance. Stay well hydrated to keep mucus thin and productive Monitor BG  6. Medication management All medications discussed and reviewed in full. All questions and concerns regarding medications addressed.    - predniSONE (DELTASONE) 10 MG  tablet; 1 tab 3 x day for 2 days, then 1 tab 2 x day for 2 days, then 1 tab 1 x day for 3 days  Dispense: 13 tablet; Refill: 0 - promethazine-dextromethorphan (PROMETHAZINE-DM) 6.25-15 MG/5ML syrup; Take 5 mLs by mouth 4 (four) times daily as needed for cough.  Dispense: 240 mL; Refill: 0 - CBC with Differential/Platelet - COMPLETE METABOLIC PANEL WITH GFR - Lipid panel - Hemoglobin A1c  Notify office for further evaluation and treatment, questions or concerns if s/s fail to improve. The risks and benefits of my recommendations, as well as other treatment options were discussed with the patient today. Questions were answered.  Further disposition pending results of labs. Discussed med's effects and SE's.    Over 30 minutes of exam, counseling, chart review, and critical decision making was performed.   Future Appointments  Date Time Provider Department Center  06/14/2023  9:30 AM Adela Glimpse, NP [facility] None  11/16/2023 10:00 AM Lucky Cowboy, MD [facility] None    ------------------------------------------------------------------------------------------------------------------   HPI BP 118/80   Pulse 80   Temp 97.9 F (36.6 C)   Resp 17   Ht 5' 10.5" (1.791 m)   Wt 250 lb 6.4 oz (113.6 kg)   SpO2 96%   BMI 35.42 kg/m   Patient complains of symptoms of a URI, possible sinusitis. Symptoms include congestion, cough described as waxing and waning over time, nasal congestion, sinus pressure, and sneezing. Onset of symptoms was 5 days ago, and has been unchanged since that time. Treatment to date: antihistamines and decongestants. Denies fever, chills.   He was diagnosed with cutaneous Lupus in 2017 and is followed at [facility].  Aug 2018 patient had a severe intolerant or allergic reaction to Imuran. Since then his cutaneous Lupus symptoms  seem reasonably controlled on Prednisone 5 mg daily. Reports hasn't had a flare in 4 years. He has some related peripheral neuropathic  pain, reports well managed with gabapentin 100 mg PRN.     BMI is Body mass index is 35.42 kg/m., he has not been working on diet and exercise. Wt Readings from Last 3 Encounters:  06/06/23 250 lb 6.4 oz (113.6 kg)  03/14/23 249 lb 3.2 oz (113 kg)  11/01/22 246 lb 3.2 oz (111.7 kg)   He had a coronary calcium score performed 10/28/2020 which was 2234 with disease spread Alprep in all 3 coronary arteries and was evaluated by Dr. Allyson Sabal. Had myoview 02/05/2021 showing no previous MI/ischemia, however intermediate risk with reduced systolic function. EF 48%. CT from 10/2020 also showed 4.1 cm ascending aortic aneurysm recommended for 1 year follow up by CTA/MRA.    BP well controlled: BP Readings from Last 3 Encounters:  06/06/23 118/80  03/14/23 136/76  11/01/22 130/80   He has aortic atherosclerosis per Ct 01/2020   He is on cholesterol medication (cannot tolerate lipitor, zocor, rosuvastatin on zetia 10 mg daily, sent in nexlizet last visit but insurance declined, Dr. Rennis Golden was attempting repatha but patient states never started due to severe flare, declines at this time, has been taking RYRS, fasting today) and denies myalgias. Declines lipid clinic referral, receptive to CT coronary calcium. His cholesterol is not at goal. The cholesterol last visit was:   He has not been working on diet and exercise for DM2 or prediabetes, T2 diabetes newly up 7+ in 2022 and denies polydipsia and polyuria.     Past Medical History:  Diagnosis Date   Asthma    Gastritis, acute with hemorrhage 08/01/2011   GERD (gastroesophageal reflux disease)    Hypertension    Lupus    Morbid obesity, unspecified obesity type ([name]) 09/03/2020   Personal history of COVID-19 06/04/2019   Reported 05/20/2019   Prediabetes    Seasonal allergies    Vitamin D deficiency    Wrist fracture 01/17/2020     Allergies  Allergen Reactions   Doxycycline     Lupus flair   Imuran [Azathioprine] Rash   Lipitor [Atorvastatin]      Myalgias    Penicillins     Causes lupus flare ups   Rosuvastatin Diarrhea   Zocor [Simvastatin]     Myalgias    Current Outpatient Medications on File Prior to Visit  Medication Sig   allopurinol (ZYLOPRIM) 300 MG tablet TAKE 1 TABLET DAILY TO PREVENT GOUT.   aspirin EC 81 MG tablet Take 81 mg by mouth daily.    atenolol (TENORMIN) 100 MG tablet TAKE ONE TABLET BY MOUTH DAILY FOR FOR BLOOD PRESSURE   busPIRone (BUSPAR) 10 MG tablet Take  1/2 to 1 tablet   2 to 3 x / day  as needed for Anxiety   Cholecalciferol (VITAMIN D3) 125 MCG (5000 UT) CAPS Take 1 capsule (5,000 Units total) by mouth daily.   clobetasol cream (TEMOVATE) 0.05 % Apply twice daily to affected areas of the body for psoriasis. Never to the face.   ezetimibe (ZETIA) 10 MG tablet Take 1 tablet Daily for Cholesterol   gabapentin (NEURONTIN) 300 MG capsule Take  1 capsule   3 x / day  for Neuropathy Pain.   hydrOXYzine (ATARAX/VISTARIL) 25 MG tablet TAKE 2 TABLETS THREE TIMES DAILY AS NEEDED FOR ITCHING.   lisinopril-hydrochlorothiazide (ZESTORETIC) 20-25 MG tablet Take 1 tablet Daily for Blod Pressure & Fluid  Retention /Ankle Swelling   Magnesium 400 MG TABS Take 1 tablet by mouth daily.   MILK THISTLE PO Take 1 tablet by mouth daily.   Multiple Vitamin (MULTIVITAMIN) capsule Take 1 capsule by mouth daily.   Omega-3 Fatty Acids (FISH OIL PO) Take 1 capsule by mouth daily.   predniSONE (DELTASONE) 5 MG tablet Take 1-2 tabs daily as directed by doctor for lupus.   Probiotic Product (PROBIOTIC DAILY) CAPS Take 1 capsule by mouth daily.   Red Yeast Rice Extract (RED YEAST RICE PO) Take 1,200 mg by mouth daily.   triamcinolone cream (KENALOG) 0.1 % Apply 1 application topically 3 (three) times daily.   TURMERIC PO Take 2,000 mg by mouth daily.    zinc gluconate 50 MG tablet Take 50 mg by mouth daily.   No current facility-administered medications on file prior to visit.    ROS: all negative except what is noted in the  HPI.   Physical Exam:  BP 118/80   Pulse 80   Temp 97.9 F (36.6 C)   Resp 17   Ht 5' 10.5" (1.791 m)   Wt 250 lb 6.4 oz (113.6 kg)   SpO2 96%   BMI 35.42 kg/m   General Appearance: NAD.  Awake, conversant and cooperative. Eyes: PERRLA, EOMs intact.  Sclera white.  Conjunctiva without erythema. Sinuses: No frontal/maxillary tenderness.  No nasal discharge. Nares patent.  ENT/Mouth: Ext aud canals clear.  Bilateral TMs w/DOL and without erythema or bulging. Hearing intact.  Posterior pharynx without swelling or exudate.  Tonsils without swelling or erythema.  Neck: Supple.  No masses, nodules or thyromegaly. Respiratory: Effort is regular with non-labored breathing. Breath sounds are equal bilaterally without rales, rhonchi, wheezing or stridor.  Cardio: RRR with no MRGs. Brisk peripheral pulses without edema.  Abdomen: Active BS in all four quadrants.  Soft and non-tender without guarding, rebound tenderness, hernias or masses. Lymphatics: Non tender without lymphadenopathy.  Musculoskeletal: Full ROM, 5/5 strength, normal ambulation.  No clubbing or cyanosis. Skin: Appropriate color for ethnicity. Warm without rashes, lesions, ecchymosis, ulcers.  Neuro: CN II-XII grossly normal. Normal muscle tone without cerebellar symptoms and intact sensation.   Psych: AO X 3,  appropriate mood and affect, insight and judgment.     Adela Glimpse, NP 3:58 PM [facility]

## 2023-06-07 LAB — CBC WITH DIFFERENTIAL/PLATELET
Absolute Lymphocytes: 1504 {cells}/uL (ref 850–3900)
Absolute Monocytes: 285 {cells}/uL (ref 200–950)
Basophils Absolute: 18 {cells}/uL (ref 0–200)
Basophils Relative: 0.2 %
Eosinophils Absolute: 9 {cells}/uL — ABNORMAL LOW (ref 15–500)
Eosinophils Relative: 0.1 %
HCT: 43.1 % (ref 38.5–50.0)
Hemoglobin: 14.5 g/dL (ref 13.2–17.1)
MCH: 32.5 pg (ref 27.0–33.0)
MCHC: 33.6 g/dL (ref 32.0–36.0)
MCV: 96.6 fL (ref 80.0–100.0)
MPV: 10.9 fL (ref 7.5–12.5)
Monocytes Relative: 3.2 %
Neutro Abs: 7084 {cells}/uL (ref 1500–7800)
Neutrophils Relative %: 79.6 %
Platelets: 220 10*3/uL (ref 140–400)
RBC: 4.46 10*6/uL (ref 4.20–5.80)
RDW: 12.3 % (ref 11.0–15.0)
Total Lymphocyte: 16.9 %
WBC: 8.9 10*3/uL (ref 3.8–10.8)

## 2023-06-07 LAB — LIPID PANEL
Cholesterol: 183 mg/dL (ref <200)
HDL: 25 mg/dL — ABNORMAL LOW (ref >=40)
LDL Cholesterol (Calc): 126 mg/dL — ABNORMAL HIGH
Non-HDL Cholesterol (Calc): 158 mg/dL — ABNORMAL HIGH (ref <130)
Total CHOL/HDL Ratio: 7.3 (calc) — ABNORMAL HIGH (ref <5.0)
Triglycerides: 200 mg/dL — ABNORMAL HIGH (ref <150)

## 2023-06-07 LAB — COMPLETE METABOLIC PANEL WITH GFR
AG Ratio: 1.2 (calc) (ref 1.0–2.5)
ALT: 57 U/L — ABNORMAL HIGH (ref 9–46)
AST: 59 U/L — ABNORMAL HIGH (ref 10–35)
Albumin: 4.2 g/dL (ref 3.6–5.1)
Alkaline phosphatase (APISO): 59 U/L (ref 35–144)
BUN/Creatinine Ratio: 16 (calc) (ref 6–22)
BUN: 24 mg/dL (ref 7–25)
CO2: 24 mmol/L (ref 20–32)
Calcium: 9.6 mg/dL (ref 8.6–10.3)
Chloride: 98 mmol/L (ref 98–110)
Creat: 1.49 mg/dL — ABNORMAL HIGH (ref 0.70–1.35)
Globulin: 3.5 g/dL (ref 1.9–3.7)
Glucose, Bld: 159 mg/dL — ABNORMAL HIGH (ref 65–139)
Potassium: 4.6 mmol/L (ref 3.5–5.3)
Sodium: 133 mmol/L — ABNORMAL LOW (ref 135–146)
Total Bilirubin: 0.5 mg/dL (ref 0.2–1.2)
Total Protein: 7.7 g/dL (ref 6.1–8.1)
eGFR: 51 mL/min/{1.73_m2} — ABNORMAL LOW (ref >=60)

## 2023-06-07 LAB — HEMOGLOBIN A1C
Hgb A1c MFr Bld: 7.2 %{Hb} — ABNORMAL HIGH (ref <5.7)
Mean Plasma Glucose: 160 mg/dL
eAG (mmol/L): 8.9 mmol/L

## 2023-06-11 ENCOUNTER — Encounter: Payer: Self-pay | Admitting: Nurse Practitioner

## 2023-06-11 NOTE — Patient Instructions (Signed)

## 2023-06-12 ENCOUNTER — Telehealth: Payer: Self-pay | Admitting: Nurse Practitioner

## 2023-06-12 NOTE — Telephone Encounter (Signed)
Patient called back for lab results. I did not feel comfortable giving him the results.

## 2023-06-13 NOTE — Telephone Encounter (Signed)
 Results given.

## 2023-06-14 ENCOUNTER — Ambulatory Visit: Payer: PPO | Admitting: Nurse Practitioner

## 2023-06-19 ENCOUNTER — Other Ambulatory Visit: Payer: PPO

## 2023-06-20 ENCOUNTER — Encounter: Payer: Self-pay | Admitting: *Deleted

## 2023-11-16 ENCOUNTER — Encounter: Payer: PPO | Admitting: Internal Medicine
